# Patient Record
Sex: Male | Born: 1965 | Hispanic: No | Marital: Married | Smoking: Never smoker
Health system: Southern US, Community
[De-identification: ages and names within clinical notes are randomized; demographics above are authoritative.]

---

## 2014-02-11 ENCOUNTER — Emergency Department (INDEPENDENT_AMBULATORY_CARE_PROVIDER_SITE_OTHER)
Admission: EM | Admit: 2014-02-11 | Discharge: 2014-02-11 | Disposition: A | Discharge status: Home / Self Care | Payer: Self-pay | Source: Home / Self Care | Attending: Family Medicine | Admitting: Family Medicine

## 2014-02-11 ENCOUNTER — Emergency Department (INDEPENDENT_AMBULATORY_CARE_PROVIDER_SITE_OTHER): Payer: Self-pay

## 2014-02-11 ENCOUNTER — Encounter (HOSPITAL_COMMUNITY): Payer: Self-pay | Admitting: Emergency Medicine

## 2014-02-11 DIAGNOSIS — R200 Anesthesia of skin: Secondary | ICD-10-CM

## 2014-02-11 DIAGNOSIS — R209 Unspecified disturbances of skin sensation: Secondary | ICD-10-CM

## 2014-02-11 DIAGNOSIS — M79601 Pain in right arm: Secondary | ICD-10-CM

## 2014-02-11 DIAGNOSIS — M79609 Pain in unspecified limb: Secondary | ICD-10-CM

## 2014-02-11 DIAGNOSIS — M79602 Pain in left arm: Principal | ICD-10-CM

## 2014-02-11 NOTE — Discharge Instructions (Signed)
Take ibuprofen every 6 hours as directed on the package.  Follow up with the neurologist as soon as you are able.     Paresthesia Paresthesia is an abnormal burning or prickling sensation. This sensation is generally felt in the hands, arms, legs, or feet. However, it may occur in any part of the body. It is usually not painful. The feeling may be described as:  Tingling or numbness.  "Pins and needles."  Skin crawling.  Buzzing.  Limbs "falling asleep."  Itching. Most people experience temporary (transient) paresthesia at some time in their lives. CAUSES  Paresthesia may occur when you breathe too quickly (hyperventilation). It can also occur without any apparent cause. Commonly, paresthesia occurs when pressure is placed on a nerve. The feeling quickly goes away once the pressure is removed. For some people, however, paresthesia is a long-lasting (chronic) condition caused by an underlying disorder. The underlying disorder may be:  A traumatic, direct injury to nerves. Examples include a:  Broken (fractured) neck.  Fractured skull.  A disorder affecting the brain and spinal cord (central nervous system). Examples include:  Transverse myelitis.  Encephalitis.  Transient ischemic attack.  Multiple sclerosis.  Stroke.  Tumor or blood vessel problems, such as an arteriovenous malformation pressing against the brain or spinal cord.  A condition that damages the peripheral nerves (peripheral neuropathy). Peripheral nerves are not part of the brain and spinal cord. These conditions include:  Diabetes.  Peripheral vascular disease.  Nerve entrapment syndromes, such as carpal tunnel syndrome.  Shingles.  Hypothyroidism.  Vitamin B12 deficiencies.  Alcoholism.  Heavy metal poisoning (lead, arsenic).  Rheumatoid arthritis.  Systemic lupus erythematosus. DIAGNOSIS  Your caregiver will attempt to find the underlying cause of your paresthesia. Your caregiver  may:  Take your medical history.  Perform a physical exam.  Order various lab tests.  Order imaging tests. TREATMENT  Treatment for paresthesia depends on the underlying cause. HOME CARE INSTRUCTIONS  Avoid drinking alcohol.  You may consider massage or acupuncture to help relieve your symptoms.  Keep all follow-up appointments as directed by your caregiver. SEEK IMMEDIATE MEDICAL CARE IF:   You feel weak.  You have trouble walking or moving.  You have problems with speech or vision.  You feel confused.  You cannot control your bladder or bowel movements.  You feel numbness after an injury.  You faint.  Your burning or prickling feeling gets worse when walking.  You have pain, cramps, or dizziness.  You develop a rash. MAKE SURE YOU:  Understand these instructions.  Will watch your condition.  Will get help right away if you are not doing well or get worse. Document Released: 10/11/2002 Document Revised: 01/13/2012 Document Reviewed: 07/12/2011 [facility] Patient Information 2014 [place].

## 2014-02-11 NOTE — ED Notes (Signed)
C/o bilateral hand pain for several years now that is progressively getting worse.  Having tingling sensation in left hand with numbness and weakness.  No relief with otc meds.

## 2014-02-11 NOTE — ED Provider Notes (Signed)
CSN: [id number]     Arrival date & time 02/11/14  1051 History   First MD Initiated Contact with Patient 02/11/14 1328     Chief Complaint  Patient presents with  . Hand Pain   (Consider location/radiation/quality/duration/timing/severity/associated sxs/prior Treatment) HPI Comments: Pt c/o forearm aching pain for years, gradually getting worse. Associated with numbness in fingers/hands that is also progressively worsening. Denies injury to UE or neck. Does repetitive motions at work.   Patient is a 48 y.o. male presenting with hand pain. The history is provided by the patient.  Hand Pain This is a chronic problem. Episode onset: years ago. The problem occurs daily. The problem has been gradually worsening. Nothing aggravates the symptoms. Nothing relieves the symptoms. He has tried nothing for the symptoms.    History reviewed. No pertinent past medical history. History reviewed. No pertinent past surgical history. History reviewed. No pertinent family history. History  Substance Use Topics  . Smoking status: Never Smoker   . Smokeless tobacco: Not on file  . Alcohol Use: Yes    Review of Systems  Constitutional: Negative for fever and chills.  Musculoskeletal: Negative for neck pain.  Neurological: Negative for weakness and numbness.    Allergies  Review of patient's allergies indicates not on file.  Home Medications  No current outpatient prescriptions on file. BP 120/58  Pulse 76  Temp(Src) 98.6 F (37 C) (Oral)  Resp 18  SpO2 100% Physical Exam  Constitutional: He is oriented to person, place, and time. He appears well-developed and well-nourished. No distress.  Musculoskeletal:       Right shoulder: He exhibits tenderness. He exhibits normal range of motion, no bony tenderness and no swelling.       Left shoulder: He exhibits tenderness. He exhibits normal range of motion, no bony tenderness and no swelling.       Right elbow: Normal.      Left elbow: Normal.      Cervical back: He exhibits normal range of motion, no tenderness, no bony tenderness, no swelling and no deformity.       Right hand: He exhibits normal range of motion. Decreased sensation noted. Normal strength noted.       Left hand: He exhibits normal range of motion and no tenderness. Decreased sensation noted. Normal strength noted.  See neuro exam  Neurological: He is alert and oriented to person, place, and time. He has normal strength. A sensory deficit is present.  Decreased sensation C7-C8 distribution in hands B; can't discriminate soft from scratchy/sharp touch in hands in general.     ED Course  Procedures (including critical care time) Labs Review Labs Reviewed - No data to display Imaging Review Dg Cervical Spine Complete  02/11/2014   CLINICAL DATA:  numbness and pain in hands and forearms  EXAM: CERVICAL SPINE  4+ VIEWS  COMPARISON:  None.  FINDINGS: There is no evidence of cervical spine fracture or prevertebral soft tissue swelling. Alignment is normal. No other significant bone abnormalities are identified.  IMPRESSION: Negative cervical spine radiographs.   Electronically Signed   By: Margaree Mackintosh M.D.   On: 02/11/2014 13:10     MDM   1. Bilateral arm pain   2. Arm numbness   pt to take otc ibuprofen as directed on the bottle on a schedule for at least a week. Referred to neurology for f/u.     Carvel Getting, NP 02/11/14 1401

## 2014-02-13 NOTE — ED Provider Notes (Signed)
Medical screening examination/treatment/procedure(s) were performed by resident physician or non-physician practitioner and as supervising physician I was immediately available for consultation/collaboration.   Pauline Good MD.   Billy Fischer, MD 02/13/14 1019

## 2014-03-01 ENCOUNTER — Ambulatory Visit: Payer: Self-pay

## 2014-03-11 ENCOUNTER — Ambulatory Visit: Payer: Self-pay

## 2014-04-13 ENCOUNTER — Ambulatory Visit: Payer: Self-pay | Attending: Internal Medicine

## 2018-12-05 DIAGNOSIS — S6720XA Crushing injury of unspecified hand, initial encounter: Secondary | ICD-10-CM

## 2018-12-05 DIAGNOSIS — M869 Osteomyelitis, unspecified: Secondary | ICD-10-CM

## 2018-12-05 HISTORY — DX: Crushing injury of unspecified hand, initial encounter: S67.20XA

## 2018-12-05 HISTORY — PX: OTHER SURGICAL HISTORY: SHX169

## 2018-12-05 HISTORY — DX: Osteomyelitis, unspecified: M86.9

## 2019-09-23 ENCOUNTER — Inpatient Hospital Stay (HOSPITAL_COMMUNITY)
Admission: EM | Admit: 2019-09-23 | Discharge: 2019-09-27 | DRG: 478 | Disposition: A | Discharge status: Home / Self Care | Payer: Medicaid Other | Attending: Family Medicine | Admitting: Family Medicine

## 2019-09-23 ENCOUNTER — Encounter (HOSPITAL_COMMUNITY): Payer: Self-pay | Admitting: Emergency Medicine

## 2019-09-23 ENCOUNTER — Encounter (HOSPITAL_COMMUNITY)
Admission: EM | Disposition: A | Discharge status: Home / Self Care | Payer: Self-pay | Source: Home / Self Care | Attending: Family Medicine

## 2019-09-23 ENCOUNTER — Inpatient Hospital Stay (HOSPITAL_COMMUNITY): Payer: Medicaid Other | Admitting: Anesthesiology

## 2019-09-23 ENCOUNTER — Other Ambulatory Visit: Payer: Self-pay

## 2019-09-23 ENCOUNTER — Emergency Department (HOSPITAL_COMMUNITY): Payer: Medicaid Other

## 2019-09-23 DIAGNOSIS — Z803 Family history of malignant neoplasm of breast: Secondary | ICD-10-CM

## 2019-09-23 DIAGNOSIS — Z881 Allergy status to other antibiotic agents status: Secondary | ICD-10-CM | POA: Diagnosis not present

## 2019-09-23 DIAGNOSIS — M86142 Other acute osteomyelitis, left hand: Secondary | ICD-10-CM | POA: Diagnosis not present

## 2019-09-23 DIAGNOSIS — B9561 Methicillin susceptible Staphylococcus aureus infection as the cause of diseases classified elsewhere: Secondary | ICD-10-CM | POA: Diagnosis not present

## 2019-09-23 DIAGNOSIS — Z20828 Contact with and (suspected) exposure to other viral communicable diseases: Secondary | ICD-10-CM | POA: Diagnosis present

## 2019-09-23 DIAGNOSIS — D72829 Elevated white blood cell count, unspecified: Secondary | ICD-10-CM | POA: Diagnosis present

## 2019-09-23 DIAGNOSIS — I96 Gangrene, not elsewhere classified: Secondary | ICD-10-CM | POA: Diagnosis present

## 2019-09-23 DIAGNOSIS — X58XXXS Exposure to other specified factors, sequela: Secondary | ICD-10-CM | POA: Diagnosis not present

## 2019-09-23 DIAGNOSIS — S6992XS Unspecified injury of left wrist, hand and finger(s), sequela: Secondary | ICD-10-CM | POA: Diagnosis not present

## 2019-09-23 DIAGNOSIS — F419 Anxiety disorder, unspecified: Secondary | ICD-10-CM | POA: Diagnosis not present

## 2019-09-23 DIAGNOSIS — G629 Polyneuropathy, unspecified: Secondary | ICD-10-CM | POA: Diagnosis present

## 2019-09-23 DIAGNOSIS — Z03818 Encounter for observation for suspected exposure to other biological agents ruled out: Secondary | ICD-10-CM | POA: Diagnosis not present

## 2019-09-23 DIAGNOSIS — M65842 Other synovitis and tenosynovitis, left hand: Secondary | ICD-10-CM | POA: Diagnosis present

## 2019-09-23 DIAGNOSIS — Z89022 Acquired absence of left finger(s): Secondary | ICD-10-CM

## 2019-09-23 DIAGNOSIS — M869 Osteomyelitis, unspecified: Secondary | ICD-10-CM | POA: Diagnosis not present

## 2019-09-23 DIAGNOSIS — M7989 Other specified soft tissue disorders: Secondary | ICD-10-CM | POA: Diagnosis not present

## 2019-09-23 DIAGNOSIS — M79645 Pain in left finger(s): Secondary | ICD-10-CM | POA: Diagnosis not present

## 2019-09-23 DIAGNOSIS — B951 Streptococcus, group B, as the cause of diseases classified elsewhere: Secondary | ICD-10-CM | POA: Diagnosis not present

## 2019-09-23 DIAGNOSIS — M659 Synovitis and tenosynovitis, unspecified: Secondary | ICD-10-CM | POA: Diagnosis not present

## 2019-09-23 HISTORY — PX: I & D EXTREMITY: SHX5045

## 2019-09-23 LAB — SARS CORONAVIRUS 2 BY RT PCR (HOSPITAL ORDER, PERFORMED IN ~~LOC~~ HOSPITAL LAB): SARS Coronavirus 2: NEGATIVE

## 2019-09-23 LAB — CBC WITH DIFFERENTIAL/PLATELET
Abs Immature Granulocytes: 0.12 10*3/uL — ABNORMAL HIGH (ref 0.00–0.07)
Basophils Absolute: 0.1 10*3/uL (ref 0.0–0.1)
Basophils Relative: 1 %
Eosinophils Absolute: 0.1 10*3/uL (ref 0.0–0.5)
Eosinophils Relative: 1 %
HCT: 41.9 % (ref 39.0–52.0)
Hemoglobin: 13.2 g/dL (ref 13.0–17.0)
Immature Granulocytes: 1 %
Lymphocytes Relative: 8 %
Lymphs Abs: 1.4 10*3/uL (ref 0.7–4.0)
MCH: 27.4 pg (ref 26.0–34.0)
MCHC: 31.5 g/dL (ref 30.0–36.0)
MCV: 87.1 fL (ref 80.0–100.0)
Monocytes Absolute: 1.8 10*3/uL — ABNORMAL HIGH (ref 0.1–1.0)
Monocytes Relative: 10 %
Neutro Abs: 14.5 10*3/uL — ABNORMAL HIGH (ref 1.7–7.7)
Neutrophils Relative %: 79 %
Platelets: 214 10*3/uL (ref 150–400)
RBC: 4.81 MIL/uL (ref 4.22–5.81)
RDW: 13 % (ref 11.5–15.5)
WBC: 18.1 10*3/uL — ABNORMAL HIGH (ref 4.0–10.5)
nRBC: 0 % (ref 0.0–0.2)

## 2019-09-23 LAB — BASIC METABOLIC PANEL
Anion gap: 7 (ref 5–15)
BUN: 14 mg/dL (ref 6–20)
CO2: 27 mmol/L (ref 22–32)
Calcium: 8.7 mg/dL — ABNORMAL LOW (ref 8.9–10.3)
Chloride: 104 mmol/L (ref 98–111)
Creatinine, Ser: 0.85 mg/dL (ref 0.61–1.24)
GFR calc Af Amer: >60 mL/min (ref >60)
GFR calc non Af Amer: >60 mL/min (ref >60)
Glucose, Bld: 88 mg/dL (ref 70–99)
Potassium: 4.8 mmol/L (ref 3.5–5.1)
Sodium: 138 mmol/L (ref 135–145)

## 2019-09-23 SURGERY — IRRIGATION AND DEBRIDEMENT EXTREMITY
Anesthesia: General | Site: Finger | Laterality: Left

## 2019-09-23 MED ORDER — LIDOCAINE 2% (20 MG/ML) 5 ML SYRINGE
INTRAMUSCULAR | Status: AC
  Filled 2019-09-23: qty 5

## 2019-09-23 MED ORDER — PHENYLEPHRINE 40 MCG/ML (10ML) SYRINGE FOR IV PUSH (FOR BLOOD PRESSURE SUPPORT)
PREFILLED_SYRINGE | INTRAVENOUS | Status: AC
  Filled 2019-09-23: qty 20

## 2019-09-23 MED ORDER — LIDOCAINE 2% (20 MG/ML) 5 ML SYRINGE
INTRAMUSCULAR | Status: DC | PRN
  Administered 2019-09-23: 60 mg via INTRAVENOUS

## 2019-09-23 MED ORDER — ONDANSETRON HCL 4 MG/2ML IJ SOLN
INTRAMUSCULAR | Status: DC | PRN
  Administered 2019-09-23: 4 mg via INTRAVENOUS

## 2019-09-23 MED ORDER — ACETAMINOPHEN 325 MG PO TABS
650.0000 mg | ORAL_TABLET | Freq: Four times a day (QID) | ORAL | Status: DC | PRN
  Administered 2019-09-23: 650 mg via ORAL
  Filled 2019-09-23: qty 2

## 2019-09-23 MED ORDER — CHLORHEXIDINE GLUCONATE 4 % EX LIQD: 60.0000 mL | Freq: Once | CUTANEOUS | Status: DC

## 2019-09-23 MED ORDER — VANCOMYCIN HCL 10 G IV SOLR
1750.0000 mg | Freq: Once | INTRAVENOUS | Status: DC
  Filled 2019-09-23: qty 1750

## 2019-09-23 MED ORDER — PROPOFOL 10 MG/ML IV BOLUS
INTRAVENOUS | Status: DC | PRN
  Administered 2019-09-23: 200 mg via INTRAVENOUS

## 2019-09-23 MED ORDER — FENTANYL CITRATE (PF) 250 MCG/5ML IJ SOLN
INTRAMUSCULAR | Status: AC
  Filled 2019-09-23: qty 5

## 2019-09-23 MED ORDER — DEXAMETHASONE SODIUM PHOSPHATE 10 MG/ML IJ SOLN
INTRAMUSCULAR | Status: DC | PRN
  Administered 2019-09-23: 10 mg via INTRAVENOUS

## 2019-09-23 MED ORDER — ONDANSETRON HCL 4 MG PO TABS: 4.0000 mg | ORAL_TABLET | Freq: Four times a day (QID) | ORAL | Status: DC | PRN

## 2019-09-23 MED ORDER — MIDAZOLAM HCL 2 MG/2ML IJ SOLN
INTRAMUSCULAR | Status: AC
  Filled 2019-09-23: qty 2

## 2019-09-23 MED ORDER — MIDAZOLAM HCL 5 MG/5ML IJ SOLN
INTRAMUSCULAR | Status: DC | PRN
  Administered 2019-09-23: 2 mg via INTRAVENOUS

## 2019-09-23 MED ORDER — KETOROLAC TROMETHAMINE 15 MG/ML IJ SOLN: 15.0000 mg | Freq: Four times a day (QID) | INTRAMUSCULAR | Status: DC | PRN

## 2019-09-23 MED ORDER — EPHEDRINE 5 MG/ML INJ
INTRAVENOUS | Status: AC
  Filled 2019-09-23: qty 20

## 2019-09-23 MED ORDER — PROPOFOL 10 MG/ML IV BOLUS
INTRAVENOUS | Status: AC
  Filled 2019-09-23: qty 20

## 2019-09-23 MED ORDER — ONDANSETRON HCL 4 MG/2ML IJ SOLN
INTRAMUSCULAR | Status: AC
  Filled 2019-09-23: qty 2

## 2019-09-23 MED ORDER — ENOXAPARIN SODIUM 40 MG/0.4ML ~~LOC~~ SOLN
40.0000 mg | SUBCUTANEOUS | Status: DC
  Administered 2019-09-24 – 2019-09-27 (×4): 40 mg via SUBCUTANEOUS
  Filled 2019-09-23 (×4): qty 0.4

## 2019-09-23 MED ORDER — SODIUM CHLORIDE 0.9 % IR SOLN
Status: DC | PRN
  Administered 2019-09-23: 3000 mL

## 2019-09-23 MED ORDER — BUPIVACAINE HCL (PF) 0.25 % IJ SOLN
INTRAMUSCULAR | Status: AC
  Filled 2019-09-23: qty 30

## 2019-09-23 MED ORDER — LACTATED RINGERS IV SOLN
INTRAVENOUS | Status: DC
  Administered 2019-09-23 (×2): via INTRAVENOUS

## 2019-09-23 MED ORDER — CLINDAMYCIN PHOSPHATE 900 MG/50ML IV SOLN
INTRAVENOUS | Status: AC
  Filled 2019-09-23: qty 50

## 2019-09-23 MED ORDER — ROCURONIUM BROMIDE 10 MG/ML (PF) SYRINGE
PREFILLED_SYRINGE | INTRAVENOUS | Status: AC
  Filled 2019-09-23: qty 10

## 2019-09-23 MED ORDER — CLINDAMYCIN PHOSPHATE 900 MG/50ML IV SOLN
900.0000 mg | INTRAVENOUS | Status: AC
  Administered 2019-09-23: 900 mg via INTRAVENOUS

## 2019-09-23 MED ORDER — POVIDONE-IODINE 10 % EX SWAB: 2.0000 "application " | Freq: Once | CUTANEOUS | Status: DC

## 2019-09-23 MED ORDER — BUPIVACAINE HCL (PF) 0.25 % IJ SOLN
INTRAMUSCULAR | Status: DC | PRN
  Administered 2019-09-23: 10 mL

## 2019-09-23 MED ORDER — DEXAMETHASONE SODIUM PHOSPHATE 10 MG/ML IJ SOLN
INTRAMUSCULAR | Status: AC
  Filled 2019-09-23: qty 1

## 2019-09-23 MED ORDER — VANCOMYCIN HCL 10 G IV SOLR
1500.0000 mg | Freq: Two times a day (BID) | INTRAVENOUS | Status: DC
  Filled 2019-09-23: qty 1500

## 2019-09-23 MED ORDER — POTASSIUM CHLORIDE IN NACL 20-0.9 MEQ/L-% IV SOLN
INTRAVENOUS | Status: AC
  Filled 2019-09-23: qty 1000

## 2019-09-23 MED ORDER — 0.9 % SODIUM CHLORIDE (POUR BTL) OPTIME
TOPICAL | Status: DC | PRN
  Administered 2019-09-23: 1000 mL

## 2019-09-23 MED ORDER — ONDANSETRON HCL 4 MG/2ML IJ SOLN: 4.0000 mg | Freq: Four times a day (QID) | INTRAMUSCULAR | Status: DC | PRN

## 2019-09-23 MED ORDER — FENTANYL CITRATE (PF) 100 MCG/2ML IJ SOLN
INTRAMUSCULAR | Status: DC | PRN
  Administered 2019-09-23: 50 ug via INTRAVENOUS
  Administered 2019-09-23 (×3): 25 ug via INTRAVENOUS

## 2019-09-23 MED ORDER — ACETAMINOPHEN 650 MG RE SUPP: 650.0000 mg | Freq: Four times a day (QID) | RECTAL | Status: DC | PRN

## 2019-09-23 SURGICAL SUPPLY — 41 items
BNDG CONFORM 2 STRL LF (GAUZE/BANDAGES/DRESSINGS) IMPLANT
BNDG ELASTIC 4X5.8 VLCR STR LF (GAUZE/BANDAGES/DRESSINGS) ×2 IMPLANT
BNDG GAUZE ELAST 4 BULKY (GAUZE/BANDAGES/DRESSINGS) ×6 IMPLANT
CORD BIPOLAR FORCEPS 12FT (ELECTRODE) ×2 IMPLANT
COVER SURGICAL LIGHT HANDLE (MISCELLANEOUS) ×2 IMPLANT
COVER WAND RF STERILE (DRAPES) ×2 IMPLANT
CUFF TOURN SGL QUICK 18X4 (TOURNIQUET CUFF) ×2 IMPLANT
CUFF TOURN SGL QUICK 24 (TOURNIQUET CUFF)
CUFF TRNQT CYL 24X4X16.5-23 (TOURNIQUET CUFF) IMPLANT
DRSG ADAPTIC 3X8 NADH LF (GAUZE/BANDAGES/DRESSINGS) ×2 IMPLANT
GAUZE SPONGE 4X4 12PLY STRL (GAUZE/BANDAGES/DRESSINGS) ×2 IMPLANT
GAUZE XEROFORM 1X8 LF (GAUZE/BANDAGES/DRESSINGS) ×2 IMPLANT
GLOVE BIO SURGEON STRL SZ7.5 (GLOVE) ×2 IMPLANT
GLOVE BIOGEL PI IND STRL 8 (GLOVE) ×1 IMPLANT
GLOVE BIOGEL PI INDICATOR 8 (GLOVE) ×1
GOWN STRL REUS W/ TWL LRG LVL3 (GOWN DISPOSABLE) ×1 IMPLANT
GOWN STRL REUS W/ TWL XL LVL3 (GOWN DISPOSABLE) ×2 IMPLANT
GOWN STRL REUS W/TWL LRG LVL3 (GOWN DISPOSABLE) ×1
GOWN STRL REUS W/TWL XL LVL3 (GOWN DISPOSABLE) ×2
KIT BASIN OR (CUSTOM PROCEDURE TRAY) ×2 IMPLANT
KIT TURNOVER KIT B (KITS) ×2 IMPLANT
MANIFOLD NEPTUNE II (INSTRUMENTS) ×2 IMPLANT
NEEDLE HYPO 25GX1X1/2 BEV (NEEDLE) IMPLANT
NS IRRIG 1000ML POUR BTL (IV SOLUTION) ×2 IMPLANT
PACK ORTHO EXTREMITY (CUSTOM PROCEDURE TRAY) ×2 IMPLANT
PAD ARMBOARD 7.5X6 YLW CONV (MISCELLANEOUS) ×2 IMPLANT
PAD CAST 4YDX4 CTTN HI CHSV (CAST SUPPLIES) ×1 IMPLANT
PADDING CAST COTTON 4X4 STRL (CAST SUPPLIES) ×1
SET CYSTO W/LG BORE CLAMP LF (SET/KITS/TRAYS/PACK) ×2 IMPLANT
SOL PREP POV-IOD 4OZ 10% (MISCELLANEOUS) ×4 IMPLANT
SPONGE LAP 4X18 RFD (DISPOSABLE) ×2 IMPLANT
SUT PROLENE 4 0 PS 2 18 (SUTURE) ×2 IMPLANT
SWAB COLLECTION DEVICE MRSA (MISCELLANEOUS) ×2 IMPLANT
SWAB CULTURE ESWAB REG 1ML (MISCELLANEOUS) ×2 IMPLANT
SYR CONTROL 10ML LL (SYRINGE) IMPLANT
TOWEL GREEN STERILE (TOWEL DISPOSABLE) ×2 IMPLANT
TOWEL GREEN STERILE FF (TOWEL DISPOSABLE) ×2 IMPLANT
TUBE CONNECTING 12X1/4 (SUCTIONS) ×2 IMPLANT
UNDERPAD 30X30 (UNDERPADS AND DIAPERS) ×2 IMPLANT
WATER STERILE IRR 1000ML POUR (IV SOLUTION) ×2 IMPLANT
YANKAUER SUCT BULB TIP NO VENT (SUCTIONS) ×2 IMPLANT

## 2019-09-23 NOTE — Anesthesia Procedure Notes (Signed)
Procedure Name: LMA Insertion Date/Time: 09/23/2019 6:26 PM Performed by: Sabriah Hobbins T, CRNA Pre-anesthesia Checklist: Patient identified, Emergency Drugs available, Suction available and Patient being monitored Patient Re-evaluated:Patient Re-evaluated prior to induction Oxygen Delivery Method: Circle system utilized Preoxygenation: Pre-oxygenation with 100% oxygen Induction Type: IV induction LMA: LMA inserted LMA Size: 5.0 Number of attempts: 1 Airway Equipment and Method: Patient positioned with wedge pillow Placement Confirmation: positive ETCO2 and breath sounds checked- equal and bilateral Tube secured with: Tape Dental Injury: Teeth and Oropharynx as per pre-operative assessment

## 2019-09-23 NOTE — ED Notes (Addendum)
Pt belongings given to pt's mother. Cell phone give to security.

## 2019-09-23 NOTE — ED Triage Notes (Signed)
Left middle finger injury last  April an has been having issues since , middle  Finger swollen and tender red, draining ,  Got worse last night, may have reinjuried it

## 2019-09-23 NOTE — ED Provider Notes (Addendum)
[facility] Emergency Department Provider Note MRN:  [id number]  [place] date & time: 09/23/19     Chief Complaint   Finger Injury   History of Present Illness   Carlos [name] is a 53 y.o. year-old male with no pertinent past medical history presenting to the ED with chief complaint of finger injury.  Patient explains that he injured multiple fingers while at work back in February.  He was evaluated by physicians in [place].  He said his recovery was complicated by bone infection of the left middle finger.  Yesterday he began having worsening pain and swelling to the left finger.  Feels similar to the last time he had bone infection.  Endorsing decreased sensation to the left finger.  Endorsing purulent discharge when he squeezes the finger.  Denies fever, no chest pain or shortness of breath, no abdominal pain, no other complaints.  Review of Systems  A complete 10 system review of systems was obtained and all systems are negative except as noted in the HPI and PMH.   Patient's Health History   Past medical history: Osteomyelitis Past surgical history: Partial amputation of finger No family history on file.  Social History   Socioeconomic History  . Marital status: Married    Spouse name: Not on file  . Number of children: Not on file  . Years of education: Not on file  . Highest education level: Not on file  Occupational History  . Not on file  Social Needs  . Financial resource strain: Not on file  . Food insecurity    Worry: Not on file    Inability: Not on file  . Transportation needs    Medical: Not on file    Non-medical: Not on file  Tobacco Use  . Smoking status: Never Smoker  . Smokeless tobacco: Never Used  Substance and Sexual Activity  . Alcohol use: Yes  . Drug use: No  . Sexual activity: Yes  Lifestyle  . Physical activity    Days per week: Not on file    Minutes per session: Not on file  . Stress: Not on file   Relationships  . Social Herbalist on phone: Not on file    Gets together: Not on file    Attends religious service: Not on file    Active member of club or organization: Not on file    Attends meetings of clubs or organizations: Not on file    Relationship status: Not on file  . Intimate partner violence    Fear of current or ex partner: Not on file    Emotionally abused: Not on file    Physically abused: Not on file    Forced sexual activity: Not on file  Other Topics Concern  . Not on file  Social History Narrative  . Not on file     Physical Exam  Vital Signs and Nursing Notes reviewed Vitals:   09/23/19 1501 09/23/19 1502  BP: (!) 128/101   Pulse: (!) 109 89  Resp: 14   Temp:    SpO2: 98% 98%    CONSTITUTIONAL: Well-appearing, NAD NEURO:  Alert and oriented x 3, no focal deficits EYES:  eyes equal and reactive ENT/NECK:  no LAD, no JVD CARDIO: Regular rate, well-perfused, normal S1 and S2 PULM:  CTAB no wheezing or rhonchi GI/GU:  normal bowel sounds, non-distended, non-tender MSK/SPINE: Prominent swelling to the left middle finger, erythema, tip of the finger with poor  cap refill, purulent discharge with palpation SKIN:  no rash, atraumatic PSYCH:  Appropriate speech and behavior  Diagnostic and Interventional Summary    EKG Interpretation  Date/Time:    Ventricular Rate:    PR Interval:    QRS Duration:   QT Interval:    QTC Calculation:   R Axis:     Text Interpretation:        Labs Reviewed  CBC WITH DIFFERENTIAL/PLATELET - Abnormal; Notable for the following components:      Result Value   WBC 18.1 (*)    Neutro Abs 14.5 (*)    Monocytes Absolute 1.8 (*)    Abs Immature Granulocytes 0.12 (*)    All other components within normal limits  BASIC METABOLIC PANEL - Abnormal; Notable for the following components:   Calcium 8.7 (*)    All other components within normal limits  SARS CORONAVIRUS 2 (TAT 6-24 HRS)    DG Finger Middle Left   Final Result      Medications  vancomycin (VANCOCIN) 1,750 mg in sodium chloride 0.9 % 500 mL IVPB (has no administration in time range)  vancomycin (VANCOCIN) 1,500 mg in sodium chloride 0.9 % 500 mL IVPB (has no administration in time range)     Procedures  /  Critical Care .Critical Care Performed by: Maudie Flakes, MD Authorized by: Maudie Flakes, MD   Critical care provider statement:    Critical care time (minutes):  33   Critical care was necessary to treat or prevent imminent or life-threatening deterioration of the following conditions: Osteomyelitis, need for urgent intervention.   Critical care was time spent personally by me on the following activities:  Discussions with consultants, evaluation of patient's response to treatment, examination of patient, ordering and performing treatments and interventions, ordering and review of laboratory studies, ordering and review of radiographic studies, pulse oximetry, re-evaluation of patient's condition, obtaining history from patient or surrogate and review of old charts    ED Course and Medical Decision Making  I have reviewed the triage vital signs and the nursing notes.  Pertinent labs & imaging results that were available during my care of the patient were reviewed by me and considered in my medical decision making (see below for details).     Clinical concern for osteomyelitis, seems to be confirmed with x-ray imaging today, fairly significant leukocytosis, but patient with normal vital signs, not febrile, well-appearing.  Anticipating need for admission for IV antibiotics and possible amputation.  Will consult hand surgery.  Hand surgery to evaluate.  Anticipate need for admission for IV antibiotics and debridement of some sort.  Hand surgery may defer admission to hospitalist service.  Signed out to oncoming provider at shift change.  3:37 PM update: Discussed case with Hilbert Odor of hand surgery, who recommends  hospitalist admission, will be taken to the OR later today, requesting rapid Covid test, which I called for and requested.  To be admitted to hospital service.  Barth Kirks. Sedonia Small, MD [place] [email or web address]  Final Clinical Impressions(s) / ED Diagnoses     ICD-10-CM   1. Osteomyelitis, unspecified site, unspecified type Operating Room Services)  M86.9     ED Discharge Orders    None       Discharge Instructions Discussed with and Provided to Patient:   Discharge Instructions   None       Maudie Flakes, MD 09/23/19 1525    Maudie Flakes, MD 09/23/19 1538

## 2019-09-23 NOTE — Consult Note (Signed)
Reason for Consult:Left long finger infection Referring Physician: Moustapha [name] is an 53 y.o. male.  HPI: Carlos [name] comes in with a 1 week hx/o drainage from his left long finger. He injured it about a year ago and had a very difficult time getting it to heal. He was ultimately treated by a hand surgeon in [place] and they apparently felt he was healed when he came up to [facility]. About a week ago he noticed some foul drainage coming from the wound and this morning he woke up with redness and swelling. He denies significant fevers. Interestingly he reports lack of sensation in that finger that predated the accident and general lack of sensation in both hands. X-rays here showed osteo of the distal phalanx and hand surgery was consulted. He is LHD.  History reviewed. No pertinent past medical history.  No past surgical history on file.  No family history on file.  Social History:  reports that he has never smoked. He has never used smokeless tobacco. He reports current alcohol use. He reports that he does not use drugs.  Allergies:  Allergies  Allergen Reactions  . Penicillins     hives    Medications: I have reviewed the patient's current medications.  Results for orders placed or performed during the hospital encounter of 09/23/19 (from the past 48 hour(s))  CBC with Differential     Status: Abnormal   Collection Time: 09/23/19  1:39 PM  Result Value Ref Range   WBC 18.1 (H) 4.0 - 10.5 K/uL   RBC 4.81 4.22 - 5.81 MIL/uL   Hemoglobin 13.2 13.0 - 17.0 g/dL   HCT 41.9 39.0 - 52.0 %   MCV 87.1 80.0 - 100.0 fL   MCH 27.4 26.0 - 34.0 pg   MCHC 31.5 30.0 - 36.0 g/dL   RDW 13.0 11.5 - 15.5 %   Platelets 214 150 - 400 K/uL   nRBC 0.0 0.0 - 0.2 %   Neutrophils Relative % 79 %   Neutro Abs 14.5 (H) 1.7 - 7.7 K/uL   Lymphocytes Relative 8 %   Lymphs Abs 1.4 0.7 - 4.0 K/uL   Monocytes Relative 10 %   Monocytes Absolute 1.8 (H) 0.1 - 1.0 K/uL   Eosinophils Relative 1 %   Eosinophils Absolute  0.1 0.0 - 0.5 K/uL   Basophils Relative 1 %   Basophils Absolute 0.1 0.0 - 0.1 K/uL   Immature Granulocytes 1 %   Abs Immature Granulocytes 0.12 (H) 0.00 - 0.07 K/uL    Comment: Performed at [facility], [address], [place], [place] [id number]  Basic metabolic panel     Status: Abnormal   Collection Time: 09/23/19  1:39 PM  Result Value Ref Range   Sodium 138 135 - 145 mmol/L   Potassium 4.8 3.5 - 5.1 mmol/L   Chloride 104 98 - 111 mmol/L   CO2 27 22 - 32 mmol/L   Glucose, Bld 88 70 - 99 mg/dL   BUN 14 6 - 20 mg/dL   Creatinine, Ser 0.85 0.61 - 1.24 mg/dL   Calcium 8.7 (L) 8.9 - 10.3 mg/dL   GFR calc non Af Amer >60 >60 mL/min   GFR calc Af Amer >60 >60 mL/min   Anion gap 7 5 - 15    Comment: Performed at [facility] [address] [place], [place] [address]    Dg Finger Middle Left  Result Date: 09/23/2019 CLINICAL DATA:  Pain and swelling  distally EXAM: LEFT THIRD FINGER 2+V COMPARISON:  None. FINDINGS: Frontal, oblique, and lateral views were obtained. There is been previous amputation of most of the third distal phalanx. Within the area of remaining distal third phalanx, there is an area of apparent loss of cortex which is concerning for potential underlying osteomyelitis. This area of suspected bony destruction does not extend to the joint space. There are several small bony fragments in this area which are probably of posttraumatic etiology. There is a focus of calcification volar to the midportion of the third middle phalanx which may represent residua of prior trauma. Bones elsewhere appear intact. No acute fracture or dislocation. No joint space narrowing or erosive change. No soft tissue air. 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 There is an area of loss of cortex in this area of amputation which must be of concern for focal osteomyelitis. This small focus of apparent bony destruction does not reach the DIP joint space.  There are fragments of bone in this area which are likely of posttraumatic etiology. A small focus of calcification volar to the third middle phalanx is likely of posttraumatic etiology. No acute fracture or dislocation. No joint space narrowing or joint erosion. From an imaging standpoint, MR potentially could be helpful to further evaluate this area in the third distal phalanx remnant concerning for small focus of destruction. Electronically Signed   By: Lowella Grip III M.D.   On: 09/23/2019 13:35    Review of Systems  Constitutional: Negative for chills, fever and weight loss.  HENT: Negative for ear discharge, ear pain, hearing loss and tinnitus.   Eyes: Negative for blurred vision, double vision, photophobia and pain.  Respiratory: Negative for cough, sputum production and shortness of breath.   Cardiovascular: Negative for chest pain.  Gastrointestinal: Negative for abdominal pain, nausea and vomiting.  Genitourinary: Negative for dysuria, flank pain, frequency and urgency.  Musculoskeletal: Negative for back pain, falls, joint pain, myalgias and neck pain.  Neurological: Negative for dizziness, tingling, sensory change, focal weakness, loss of consciousness and headaches.  Endo/Heme/Allergies: Does not bruise/bleed easily.  Psychiatric/Behavioral: Negative for depression, memory loss and substance abuse. The patient is not nervous/anxious.    Blood pressure (!) 128/101, pulse 89, temperature 98.5 F (36.9 C), temperature source Oral, resp. rate 14, height 6\' 4"  (1.93 m), weight 84.4 kg, SpO2 98 %. Physical Exam  Constitutional: He appears well-developed and well-nourished. No distress.  HENT:  Head: Normocephalic and atraumatic.  Eyes: Conjunctivae are normal. Right eye exhibits no discharge. Left eye exhibits no discharge. No scleral icterus.  Neck: Normal range of motion.  Cardiovascular: Normal rate and regular rhythm.  Respiratory: Effort normal. No respiratory distress.   Musculoskeletal:     Comments: Left shoulder, elbow, wrist, digits- Fusiform edema, erythema long finger, purulent discharge from sinus on tip, nontender, unable to extend at PIP, no instability, no blocks to motion  Sens  Ax/R/M/U paresthetic  Mot   Ax/ R/ PIN/ M/ AIN/ U intact  Rad 2+  Neurological: He is alert.  Skin: Skin is warm and dry. He is not diaphoretic.  Psychiatric: He has a normal mood and affect. His behavior is normal.      Assessment/Plan: Left long finger infection -- Will take to OR for I&D, possible amputation. Ask medicine to admit for IV abx, will need workup for hand numbness as well as his history is not likely c/w carpal tunnel.    Lisette Abu, PA-C  Orthopedic Surgery [phone] 09/23/2019, 3:11 PM

## 2019-09-23 NOTE — Progress Notes (Addendum)
Report received from [place] in PACU. Patient VS stable, no c/o of current pain. Will continue to monitor. Patient phone retrieved from security and given to patient.

## 2019-09-23 NOTE — ED Notes (Signed)
Provider at bedside

## 2019-09-23 NOTE — H&P (Signed)
History and Physical    Carlos [name] [id number] DOB: 07/21/1966 DOA: 09/23/2019  PCP: Patient, No Pcp Per  Patient coming from: Home  I have personally briefly reviewed patient's old medical records in [place]  Chief Complaint: Left long finger pain and swelling since this morning  HPI: Carlos [name] is a 53 y.o. male with medical history significant of crush injury to the bilateral hands in February 2020 it resulted in several broken fingers and decrease of sensation in his hands.  Unfortunately his left long finger developed an osteomyelitis and he was treated conservatively but that did not resolve.  He required a partial amputation of his finger and seemed to have resolved.  He was in his usual state of health until 1 week ago when he noted drainage from his left long finger.  This morning he awoke with redness and swelling of the finger without any lymphangitis.  He has had no fevers.  His lack of sensation probably contributed to the late presentation.  X-rays showed osteomyelitis of the distal phalanx of the left long finger.  Hand surgery has been consulted and has seen the patient.  They asked that we admit the patient for further evaluation and management.  ED Course: Vital signs stable, leukocytosis noted.  X-rays consistent with osteomyelitis of left long finger.  Review of Systems: As per HPI otherwise all other systems reviewed and  negative.  Past Medical History:  Diagnosis Date  . Crush injury to hand 12/2018   bilateral hands with multipe fractures  . Finger osteomyelitis, left ([place]) 12/2018    Past Surgical History:  Procedure Laterality Date  . partial amputation left 3rd finger Left 12/2018    Social History   Social History Narrative   Lives with mother     reports that he has never smoked. He has never used smokeless tobacco. He reports current alcohol use of about 2.0 standard drinks of alcohol per week. He reports that he does not use drugs.   Allergies  Allergen Reactions  . Penicillins Hives    Did it involve swelling of the face/tongue/throat, SOB, or low No Did it involve sudden or severe rash/hives, skin peeling, or any reaction on the inside of your mouth or nose?Yes Did you need to seek medical attention at a hospital or doctor's office? Yes When did it last happen?April 2020 If all above answers are "NO", may proceed with cephalosporin use.    Family History  Problem Relation Age of Onset  . Breast cancer Mother        DCIS times 2     Prior to Admission medications   Not on File    Physical Exam:  Constitutional: NAD, calm, comfortable Vitals:   09/23/19 1400 09/23/19 1501 09/23/19 1502 09/23/19 1515  BP:  (!) 128/101  (!) 129/96  Pulse:  (!) 109 89   Resp:  14    Temp:      TempSrc:      SpO2:  98% 98%   Weight: 84.4 kg  84.4 kg   Height:   6\' 4"  (1.93 m)    Eyes: PERRL, lids and conjunctivae normal ENMT: Mucous membranes are moist. Posterior pharynx clear of any exudate or lesions.Normal dentition.  Neck: normal, supple, no masses, no thyromegaly Respiratory: clear to auscultation bilaterally, no wheezing, no crackles. Normal respiratory effort. No accessory muscle use.  Cardiovascular: Regular rate and rhythm, no murmurs / rubs / gallops. No extremity edema. 2+ pedal pulses. No carotid bruits.  Abdomen: no tenderness, no masses palpated. No hepatosplenomegaly. Bowel sounds positive.  Musculoskeletal: no clubbing / cyanosis. No joint deformity upper right and bilateral lower extremities. Good ROM, no contractures. Normal muscle tone.  Left long finger markedly swollen with erythema but no lymphangitis amputation site with old eschar Skin: no rashes, lesions, ulcers. No induration Neurologic: CN 2-12 grossly intact. Sensation intact, DTR normal. Strength 5/5 in all 4.  Psychiatric: Normal judgment and insight. Alert and oriented x 3. Normal mood.    Labs on Admission: I have personally reviewed  following labs and imaging studies  CBC: Recent Labs  Lab 09/23/19 1339  WBC 18.1*  NEUTROABS 14.5*  HGB 13.2  HCT 41.9  MCV 87.1  PLT [id number]   Basic Metabolic Panel: Recent Labs  Lab 09/23/19 1339  NA 138  K 4.8  CL 104  CO2 27  GLUCOSE 88  BUN 14  CREATININE 0.85  CALCIUM 8.7*   GFR: Estimated Creatinine Clearance: 122.7 mL/min (by C-G formula based on SCr of 0.85 mg/dL).  Radiological Exams on Admission: Dg Finger Middle Left  Result Date: 09/23/2019 CLINICAL DATA:  Pain and swelling distally EXAM: LEFT THIRD FINGER 2+V COMPARISON:  None. FINDINGS: Frontal, oblique, and lateral views were obtained. There is been previous amputation of most of the third distal phalanx. Within the area of remaining distal third phalanx, there is an area of apparent loss of cortex which is concerning for potential underlying osteomyelitis. This area of suspected bony destruction does not extend to the joint space. There are several small bony fragments in this area which are probably of posttraumatic etiology. There is a focus of calcification volar to the midportion of the third middle phalanx which may represent residua of prior trauma. Bones elsewhere appear intact. No acute fracture or dislocation. No joint space narrowing or erosive change. No soft tissue air. 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 There is an area of loss of cortex in this area of amputation which must be of concern for focal osteomyelitis. This small focus of apparent bony destruction does not reach the DIP joint space. There are fragments of bone in this area which are likely of posttraumatic etiology. A small focus of calcification volar to the third middle phalanx is likely of posttraumatic etiology. No acute fracture or dislocation. No joint space narrowing or joint erosion. From an imaging standpoint, MR potentially could be helpful to further evaluate this area in the third distal  phalanx remnant concerning for small focus of destruction. Electronically Signed   By: Lowella Grip III M.D.   On: 09/23/2019 13:35     Assessment/Plan Principal Problem:   Finger osteomyelitis, left (HCC) Active Problems:   Leukocytosis    Osteomyelitis of left long finger: Patient is left-handed.  He was seen by hand surgery who plans to take him to the operating room tonight.  Initially vancomycin was ordered by ED but this has been held by orthopedic surgery who plans to order antibiotics after culture obtained.  Does not appear to be septic.  He has no fever, no chills, no shortness of breath, he had a transiently elevated pulse in the emergency department but it has not been sustained.  Oxygen saturations are stable.    Leukocytosis: Laded to acute infection we will continue to monitor recheck labs in a.m.  DVT prophylaxis: Lovenox Code Status: Full code discussed with patient Family Communication: Spoke with patient's mother who was present in the room at the  time of admission Disposition Plan: Likely discharge home in 3 to 4 days Consults called: Hand surgery, Dr. Jeannie Fend Admission status: Admit - It is my clinical opinion that admission to INPATIENT is reasonable and necessary because of the expectation that this patient will require hospital care that crosses at least 2 midnights to treat this condition based on the medical complexity of the problems presented.  Given the aforementioned information, the predictability of an adverse outcome is felt to be significant.  Patient will be requiring IV antibiotics in addition to a surgical procedure and management postoperatively    Lady Deutscher MD FACP [facility] Pager [phone]  How to contact the [facility] Attending or Consulting provider [place] or covering provider during after hours [place], for this patient?  1. Check the care team in [facility] and look for a) attending/consulting [facility] provider listed and b) the [facility]  team listed 2. Log into [email or web address] and use [place]'s universal password to access. If you do not have the password, please contact the hospital operator. 3. Locate the [facility] provider you are looking for under [facility] and page to a number that you can be directly reached. 4. If you still have difficulty reaching the provider, please page the [facility] (Director on Call) for the Hospitalists listed on amion for assistance.  If 7PM-7AM, please contact night-coverage [email or web address] Password [facility]  09/23/2019, 4:34 PM

## 2019-09-23 NOTE — Op Note (Signed)
PREOPERATIVE DIAGNOSIS: Left middle finger distal phalanx osteomyelitis  POSTOPERATIVE DIAGNOSIS: Left middle finger distal phalanx osteomyelitis and flexor tenosynovitis  ATTENDING PHYSICIAN: Maudry Mayhew. Jeannie Fend, III, MD who was present and scrubbed for the entire case   ASSISTANT SURGEON: None.   ANESTHESIA: General with postoperative digital block  SURGICAL PROCEDURES: 1.  Left middle finger revision amputation through the midportion of the middle phalanx 2.  Left middle finger distal phalanx bone biopsy 3.  Irrigation debridement of left middle finger flexor tendon sheath extending into the palm  SURGICAL INDICATIONS: Patient is a 53 year old male who presented to the ER earlier today.  He states that several months ago he had a partial amputation to the tip of the digit that was treated in [place].  He had some wound healing issues and had been doing well up until the past week.  He started to notice some drainage from the tip of the digit and over the past 24 hours developed increased swelling and redness to the finger.  He presented to the ER where radiographs were obtained which showed lysis of the remaining portion of the distal phalanx which was concerning for osteomyelitis.  After discussing treatment options the patient did wish to proceed forward with irrigation debridement of the finger, revision amputation and any indicated surgeries.  FINDING: There was gross purulence throughout the entire digit both palmarly and dorsally.  This extended through the flexor tendon sheath into the palm.  Successful irrigation debridement of the finger and flexor tendon sheath was performed.  A subsequent revision amputation of the finger was also performed removing the distal, grossly infected portions of the finger.  This resulted in a amputation level at the midportion of the middle phalanx.  Tension-free closure was achieved.  DESCRIPTION OF PROCEDURE: The patient was identified in the [place]  holding area where the risk benefits and alternatives of the procedure were discussed with the patient.  These include but are not limited to infection, bleeding, damage to surrounding structures including blood vessels and nerves, pain, stiffness, recurrence and need for additional procedures.  Informed consent was obtained at that time and the patient's left hand was marked with a surgical marking pen.  He was then brought back to the operative suite where timeout was performed identifying the correct patient operative site.  He was positioned supine on the operative table and induced under general anesthesia.  A tourniquet was placed on the upper arm and the arm was then prepped and draped in usual sterile fashion.  The limb was exsanguinated by gravity and the tourniquet was inflated.  A fishmouth incision was began of the long mid axial planes at the tip of the digit.  Palmar and dorsal skin flaps were developed and there was abundant purulent material which was cultured for both aerobic and anaerobic specimens.  There was an abundant amount of infected soft tissues including the skin and subcutaneous tissue which were debrided using curette and rondure.  The remaining portion of the distal phalanx was sharply excised and sent to pathology for bone biopsy.  There is also an abundant amount of fluid within the flexor tendon sheath and with palpation of the sheath in the palm, purulent feel it could be expressed at the tip of the digit.  The decision was then made to extend the incision.  The incision was converted to a [place] type incision was extended along the volar aspect of the finger into the palm.  Skin flaps were elevated and the flexor tendon  sheath was visualized.  There was purulent material about the flexor tendons throughout the flexor tendon sheath.  All obvious necrotic and infected material was debrided both sharply as well as with a curette and rondure.  The wound was then copiously irrigated  with normal saline including irrigation of the flexor tendon sheath using cystoscopy tubing.  Once this had been accomplished the distal, necrotic skin edges were debrided.  A bone biter was used to shorten the middle phalanx at its midportion.  Smooth contour of the remaining phalanx was achieved.  The distal skin which was infected was resected and all skin was closed with interrupted 4-0 Prolene sutures in a tension-free manner.  Volar and dorsal skin flaps were closed over top of the amputated middle phalanx.  The tourniquet was released and the volar skin flap had return of brisk capillary refill.  The wounds were dressed with Xeroform, 4 x 4's and a soft dressing.  The patient was awoken from his anesthesia and extubated in the operating room without any complications.  He was taken to the PACU in stable condition.  ESTIMATED BLOOD LOSS: 20 mL  TOURNIQUET TIME: Less than an hour  SPECIMENS: Aerobic and anaerobic cultures.  Distal phalanx bone biopsy  POSTOPERATIVE PLAN: The patient will be admitted to the medicine service for postoperative IV antibiotics.  We will follow-up his cultures and biopsy to determine a treatment plan going forward.  Based on the severe infection to the digit I do recommend infectious disease consult for a antibiotic plan going forward.  IMPLANTS: None

## 2019-09-23 NOTE — Anesthesia Preprocedure Evaluation (Addendum)
Anesthesia Evaluation  Patient identified by MRN, date of birth, ID band Patient awake    Reviewed: Allergy & Precautions, NPO status , Patient's Chart, lab work & pertinent test results  Airway Mallampati: II  TM Distance: >3 FB     Dental  (+) Dental Advisory Given   Pulmonary neg pulmonary ROS,    breath sounds clear to auscultation       Cardiovascular negative cardio ROS   Rhythm:Regular Rate:Normal     Neuro/Psych negative neurological ROS  negative psych ROS   GI/Hepatic negative GI ROS, Neg liver ROS,   Endo/Other  negative endocrine ROS  Renal/GU negative Renal ROS     Musculoskeletal Crush injury to hand Finger osteomyelitis, left    Abdominal   Peds  Hematology negative hematology ROS (+)   Anesthesia Other Findings Left long finger infection  Reproductive/Obstetrics                            Anesthesia Physical Anesthesia Plan  ASA: II and emergent  Anesthesia Plan: General   Post-op Pain Management:    Induction: Intravenous  PONV Risk Score and Plan: 2 and Dexamethasone, Ondansetron and Treatment may vary due to age or medical condition  Airway Management Planned: LMA  Additional Equipment:   Intra-op Plan:   Post-operative Plan: Extubation in OR  Informed Consent: I have reviewed the patients History and Physical, chart, labs and discussed the procedure including the risks, benefits and alternatives for the proposed anesthesia with the patient or authorized representative who has indicated his/her understanding and acceptance.     Dental advisory given  Plan Discussed with: CRNA  Anesthesia Plan Comments:        Anesthesia Quick Evaluation

## 2019-09-23 NOTE — Transfer of Care (Signed)
Immediate Anesthesia Transfer of Care Note  Patient: Carlos [name]  Procedure(s) Performed: IRRIGATION AND DEBRIDEMENT OF  LEFT LONG FINGER, REVISION OF LEFT LONG FINGER AMPUTATION (Left Finger)  Patient Location: PACU  Anesthesia Type:General  Level of Consciousness: awake, oriented and sedated  Airway & Oxygen Therapy: Patient Spontanous Breathing  Post-op Assessment: Report given to RN and Post -op Vital signs reviewed and stable  Post vital signs: Reviewed  Last Vitals:  Vitals Value Taken Time  BP 117/75 09/23/19 1951  Temp    Pulse    Resp 18 09/23/19 1952  SpO2    Vitals shown include unvalidated device data.  Last Pain:  Vitals:   09/23/19 1456  TempSrc:   PainSc: 0-No pain         Complications: No apparent anesthesia complications

## 2019-09-23 NOTE — Plan of Care (Signed)

## 2019-09-23 NOTE — Progress Notes (Signed)
Pharmacy Antibiotic Note  Carlos [name] is a 53 y.o. male admitted on 09/23/2019 with left middle finger injury.  Pharmacy has been consulted for vancomycin dosing for osteomyelitis. X-ray on 09/23/2019 found an area of loss of cortex in area of previous amputation which may be of concern for osteomyelitis. WBC 18.1. Afebrile. Scr 0.85 with current CrCl of 123 ml/min.   Vancomycin 1500 mg IV Q 12 hrs. Goal AUC 400-550. Expected AUC: 465 SCr used: 0.85 Weight for CrCl & Ke: TBW (because TBW < IBW)   Plan: Vancomycin 1750mg  IV x1 loading dose  Start vancomycin 1500mg  IV q12h (next dose 0400 on 11/20) Obtain vancomycin levels as indicated Monitor renal function, clinical progression and cultures if obtained     Temp (24hrs), Avg:98.5 F (36.9 C), Min:98.5 F (36.9 C), Max:98.5 F (36.9 C)  Recent Labs  Lab 09/23/19 1339  WBC 18.1*  CREATININE 0.85    CrCl cannot be calculated (Unknown ideal weight.).    Allergies  Allergen Reactions  . Penicillins     hives    Antimicrobials this admission: Vancomycin 11/19 >>   Dose adjustments this admission: N/A  Microbiology results: 11/19 COVID: ordered  Thank you for allowing pharmacy to be a part of this patient's care.   Cristela Felt, PharmD PGY1 Pharmacy Resident [employer]: [phone]  09/23/2019 2:49 PM

## 2019-09-23 NOTE — Anesthesia Postprocedure Evaluation (Signed)
Anesthesia Post Note  Patient: Zennon Freet  Procedure(s) Performed: IRRIGATION AND DEBRIDEMENT OF  LEFT LONG FINGER, REVISION OF LEFT LONG FINGER AMPUTATION (Left Finger)     Patient location during evaluation: PACU Anesthesia Type: General Level of consciousness: awake and alert Pain management: pain level controlled Vital Signs Assessment: post-procedure vital signs reviewed and stable Respiratory status: spontaneous breathing, nonlabored ventilation, respiratory function stable and patient connected to nasal cannula oxygen Cardiovascular status: blood pressure returned to baseline and stable Postop Assessment: no apparent nausea or vomiting Anesthetic complications: no    Last Vitals:  Vitals:   09/23/19 2015 09/23/19 2033  BP:  113/71  Pulse:  81  Resp:  14  Temp:  37.2 C  SpO2: 100% 100%    Last Pain:  Vitals:   09/23/19 2033  TempSrc: Oral  PainSc: 0-No pain    LLE Motor Response: (P) Purposeful movement;Responds to commands (09/23/19 2052) LLE Sensation: (P) Full sensation (09/23/19 2052) RLE Motor Response: (P) Purposeful movement;Responds to commands (09/23/19 2052) RLE Sensation: (P) Full sensation (09/23/19 2052)      Tiajuana Amass

## 2019-09-24 ENCOUNTER — Encounter (HOSPITAL_COMMUNITY): Payer: Self-pay | Admitting: Orthopaedic Surgery

## 2019-09-24 LAB — CBC
HCT: 37.7 % — ABNORMAL LOW (ref 39.0–52.0)
Hemoglobin: 12.2 g/dL — ABNORMAL LOW (ref 13.0–17.0)
MCH: 27.2 pg (ref 26.0–34.0)
MCHC: 32.4 g/dL (ref 30.0–36.0)
MCV: 84.2 fL (ref 80.0–100.0)
Platelets: 193 10*3/uL (ref 150–400)
RBC: 4.48 MIL/uL (ref 4.22–5.81)
RDW: 12.7 % (ref 11.5–15.5)
WBC: 16.4 10*3/uL — ABNORMAL HIGH (ref 4.0–10.5)
nRBC: 0 % (ref 0.0–0.2)

## 2019-09-24 LAB — HIV ANTIBODY (ROUTINE TESTING W REFLEX): HIV Screen 4th Generation wRfx: NONREACTIVE

## 2019-09-24 MED ORDER — VANCOMYCIN HCL 10 G IV SOLR
1500.0000 mg | Freq: Two times a day (BID) | INTRAVENOUS | Status: DC
  Administered 2019-09-25 – 2019-09-26 (×3): 1500 mg via INTRAVENOUS
  Filled 2019-09-24 (×6): qty 1500

## 2019-09-24 MED ORDER — VANCOMYCIN HCL 10 G IV SOLR
1750.0000 mg | Freq: Once | INTRAVENOUS | Status: AC
  Administered 2019-09-24: 1750 mg via INTRAVENOUS
  Filled 2019-09-24: qty 1750

## 2019-09-24 NOTE — Progress Notes (Signed)
[facility] PROGRESS NOTE  Carlos [name] [id number] DOB: Nov 21, 1965 DOA: 09/23/2019 PCP: Patient, No Pcp Per   Subjective  He feels okay today, denies any fever or chills. He is on clindamycin, per Ortho recommendation we will consult ID.  Brief history  Carlos [name] is a 53 y.o. male with medical history significant of crush injury to the bilateral hands in February 2020 it resulted in several broken fingers and decrease of sensation in his hands.  Unfortunately his left long finger developed an osteomyelitis and he was treated conservatively but that did not resolve.  He required a partial amputation of his finger and seemed to have resolved.  He was in his usual state of health until 1 week ago when he noted drainage from his left long finger.  This morning he awoke with redness and swelling of the finger without any lymphangitis.  He has had no fevers.  His lack of sensation probably contributed to the late presentation.  X-rays showed osteomyelitis of the distal phalanx of the left long finger.  Hand surgery has been consulted and has seen the patient.  They asked that we admit the patient for further evaluation and management.  Assessment and plan   Principal Problem:   Finger osteomyelitis, left (HCC) Active Problems:   Leukocytosis   Left third finger acute osteomyelitis Status post partial amputation done by Dr. Charlane Ferretti on 09/23/2019. Had previous partial amputation done after osteomyelitis and crushing injury to the hand. Currently on clindamycin, will ask ID for antibiotics recommendation.  Leukocytosis This is secondary to the acute osteomyelitis  Code Status: Full Code Family Communication: Plan discussed with the patient. Disposition Plan: Remains inpatient Diet:  Diet Order            Diet regular Room service appropriate? Yes; Fluid consistency: Thin  Diet effective now              Consultants   Ortho  ID  Procedures   Partial amputation  of the left long finger done by Dr. Jeannie Fend on 1119.  Antibiotics   Clindamycin  Objective   Vitals:   09/24/19 0337 09/24/19 0832  BP: 111/62 117/71  Pulse: 89 64  Resp: 18 16  Temp: (!) 97.5 F (36.4 C) 97.8 F (36.6 C)  SpO2: 100% 99%    Intake/Output Summary (Last 24 hours) at 09/24/2019 1208 Last data filed at 09/24/2019 0900 Gross per 24 hour  Intake 1140 ml  Output 15 ml  Net 1125 ml   Filed Weights   09/23/19 1400 09/23/19 1502  Weight: 84.4 kg 84.4 kg    Physical examination  General: Alert and awake, oriented x3, not in any acute distress. HEENT: anicteric sclera, pupils reactive to light and accommodation, EOMI CVS: S1-S2 clear, no murmur rubs or gallops Chest: clear to auscultation bilaterally, no wheezing, rales or rhonchi Abdomen: soft nontender, nondistended, normal bowel sounds, no organomegaly Extremities: no cyanosis, clubbing or edema noted bilaterally Neuro: Cranial nerves II-XII intact, no focal neurological deficits  Reviewed data  Basic Metabolic Panel: Recent Labs  Lab 09/23/19 1339  NA 138  K 4.8  CL 104  CO2 27  GLUCOSE 88  BUN 14  CREATININE 0.85  CALCIUM 8.7*   Liver Function Tests: No results for input(s): AST, ALT, ALKPHOS, BILITOT, PROT, ALBUMIN in the last 168 hours. No results for input(s): LIPASE, AMYLASE in the last 168 hours. No results for input(s): AMMONIA in the last 168 hours. CBC: Recent Labs  Lab 09/23/19 1339 09/24/19  0333  WBC 18.1* 16.4*  NEUTROABS 14.5*  --   HGB 13.2 12.2*  HCT 41.9 37.7*  MCV 87.1 84.2  PLT 214 193   Cardiac Enzymes: No results for input(s): CKTOTAL, CKMB, CKMBINDEX, TROPONINI in the last 168 hours. BNP (last 3 results) No results for input(s): BNP in the last 8760 hours.  ProBNP (last 3 results) No results for input(s): PROBNP in the last 8760 hours.  CBG: No results for input(s): GLUCAP in the last 168 hours.  Micro Recent Results (from the past 240 hour(s))  SARS  Coronavirus 2 by RT PCR (hospital order, performed in [facility] hospital lab) Nasopharyngeal Nasopharyngeal Swab     Status: None   Collection Time: 09/23/19  3:16 PM   Specimen: Nasopharyngeal Swab  Result Value Ref Range Status   SARS Coronavirus 2 NEGATIVE NEGATIVE Final    Comment: (NOTE) If result is NEGATIVE SARS-CoV-2 target nucleic acids are NOT DETECTED. The SARS-CoV-2 RNA is generally detectable in upper and lower  respiratory specimens during the acute phase of infection. The lowest  concentration of SARS-CoV-2 viral copies this assay can detect is 250  copies / mL. A negative result does not preclude SARS-CoV-2 infection  and should not be used as the sole basis for treatment or other  patient management decisions.  A negative result may occur with  improper specimen collection / handling, submission of specimen other  than nasopharyngeal swab, presence of viral mutation(s) within the  areas targeted by this assay, and inadequate number of viral copies  (<250 copies / mL). A negative result must be combined with clinical  observations, patient history, and epidemiological information. If result is POSITIVE SARS-CoV-2 target nucleic acids are DETECTED. The SARS-CoV-2 RNA is generally detectable in upper and lower  respiratory specimens dur ing the acute phase of infection.  Positive  results are indicative of active infection with SARS-CoV-2.  Clinical  correlation with patient history and other diagnostic information is  necessary to determine patient infection status.  Positive results do  not rule out bacterial infection or co-infection with other viruses. If result is PRESUMPTIVE POSTIVE SARS-CoV-2 nucleic acids MAY BE PRESENT.   A presumptive positive result was obtained on the submitted specimen  and confirmed on repeat testing.  While 2019 novel coronavirus  (SARS-CoV-2) nucleic acids may be present in the submitted sample  additional confirmatory testing may be  necessary for epidemiological  and / or clinical management purposes  to differentiate between  SARS-CoV-2 and other Sarbecovirus currently known to infect humans.  If clinically indicated additional testing with an alternate test  methodology [phone]) is advised. The SARS-CoV-2 RNA is generally  detectable in upper and lower respiratory sp ecimens during the acute  phase of infection. The expected result is Negative. Fact Sheet for Patients:  StrictlyIdeas.no Fact Sheet for Healthcare Providers: BankingDealers.co.za This test is not yet approved or cleared by the [place] FDA and has been authorized for detection and/or diagnosis of SARS-CoV-2 by FDA under an Emergency Use Authorization (EUA).  This EUA will remain in effect (meaning this test can be used) for the duration of the COVID-19 declaration under Section 564(b)(1) of the Act, 21 U.S.C. section 360bbb-3(b)(1), unless the authorization is terminated or revoked sooner. Performed at [facility], [place] [address] [place], [place] [address]   Aerobic/Anaerobic Culture (surgical/deep wound)     Status: None (Preliminary result)   Collection Time: 09/23/19  6:40 PM   Specimen: PATH Bone resection; Tissue  Result Value Ref Range Status   Specimen Description TISSUE BONE LEFT FINGER  Final   Special Requests LEFT MIDDLE FINGER  Final   Gram Stain   Final    FEW WBC PRESENT, PREDOMINANTLY PMN ABUNDANT GRAM POSITIVE COCCI    Culture   Final    TOO YOUNG TO READ Performed at [facility], [place] [address] [place], [place] [address]    Report Status PENDING  Incomplete  Aerobic/Anaerobic Culture (surgical/deep wound)     Status: None (Preliminary result)   Collection Time: 09/23/19  6:56 PM   Specimen: Wound; Tissue  Result Value Ref Range Status   Specimen Description WOUND LEFT FINGER  Final   Special Requests SWAB OF LEFT MIDDLE FINGER TIS  Final   Gram Stain    Final    MODERATE WBC PRESENT, PREDOMINANTLY PMN ABUNDANT GRAM POSITIVE COCCI    Culture   Final    TOO YOUNG TO READ Performed at [facility], [place] [address], [place], [place] [address]    Report Status PENDING  Incomplete     Radiological studies, reviewed  Dg Finger Middle Left  Result Date: 09/23/2019 CLINICAL DATA:  Pain and swelling distally EXAM: LEFT THIRD FINGER 2+V COMPARISON:  None. FINDINGS: Frontal, oblique, and lateral views were obtained. There is been previous amputation of most of the third distal phalanx. Within the area of remaining distal third phalanx, there is an area of apparent loss of cortex which is concerning for potential underlying osteomyelitis. This area of suspected bony destruction does not extend to the joint space. There are several small bony fragments in this area which are probably of posttraumatic etiology. There is a focus of calcification volar to the midportion of the third middle phalanx which may represent residua of prior trauma. Bones elsewhere appear intact. No acute fracture or dislocation. No joint space narrowing or erosive change. No soft tissue air. 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 There is an area of loss of cortex in this area of amputation which must be of concern for focal osteomyelitis. This small focus of apparent bony destruction does not reach the DIP joint space. There are fragments of bone in this area which are likely of posttraumatic etiology. A small focus of calcification volar to the third middle phalanx is likely of posttraumatic etiology. No acute fracture or dislocation. No joint space narrowing or joint erosion. From an imaging standpoint, MR potentially could be helpful to further evaluate this area in the third distal phalanx remnant concerning for small focus of destruction. Electronically Signed   By: Lowella Grip III M.D.   On: 09/23/2019 13:35    Scheduled  Meds:  enoxaparin (LOVENOX) injection  40 mg Subcutaneous Q24H   Continuous Infusions:  0.9 % NaCl with KCl 20 mEq / L     lactated ringers 10 mL/hr at 09/23/19 2050     Time spent: 35 minutes  [facility] [facility] Pager [phone] If 7PM-7AM, please contact night-coverage at [email or web address], password [facility] 09/24/2019, 12:08 PM  LOS: 1 day

## 2019-09-24 NOTE — Progress Notes (Signed)
PT Progress Note for Charges    09/24/19 1100  PT Visit Information  Last PT Received On 09/24/19  PT General Charges  $$ ACUTE PT VISIT 1 Visit  PT Evaluation  $PT Eval Low Complexity 1 Low  PT Treatments  $Gait Training 8-22 mins  Anastasio Champion, DPT  Acute Rehabilitation Services Pager [phone] Office [phone]

## 2019-09-24 NOTE — Evaluation (Signed)
Occupational Therapy Evaluation Patient Details Name: Carlos [name] MRN: [id number] DOB: 07-10-1966 Today's Date: 09/24/2019    History of Present Illness Carlos [name] is a 53 y.o. M s/p irrigation and debridement of L 3rd finger, revision of L 3rd finger amputation. He has a PMH including but not limited to L 3rd finger osteomyelitis.    Clinical Impression   Pt is at Mod I level with ADLs and selfcare and independent with mobility. L hand does not impede or hinder ADL function at this time. Pt able to perform flexion, extension and opposition of L digits WNL without pain. All education completed and no further OT is indicated at this time    Follow Up Recommendations  Follow surgeon's recommendation for DC plan and follow-up therapies(outpatient OT for L hand as appropriate per MD)    Equipment Recommendations  None recommended by OT    Recommendations for Other Services       Precautions / Restrictions Precautions Precautions: None Restrictions Weight Bearing Restrictions: No      Mobility Bed Mobility               General bed mobility comments: pt in recliner upon arrival. Per PT note, pt is Mod I with bed mobility  Transfers Overall transfer level: Independent Equipment used: None                  Balance Overall balance assessment: Independent                                         ADL either performed or assessed with clinical judgement   ADL Overall ADL's : Needs assistance/impaired Eating/Feeding: Set up;Sitting Eating/Feeding Details (indicate cue type and reason): impaired use of L hand Grooming: Wash/dry hands;Wash/dry face;Oral care;Modified independent   Upper Body Bathing: Modified independent   Lower Body Bathing: Modified independent   Upper Body Dressing : Modified independent   Lower Body Dressing: Modified independent   Toilet Transfer: Independent   Toileting- Clothing Manipulation and Hygiene: Modified  independent       Functional mobility during ADLs: Independent       Vision Patient Visual Report: No change from baseline       Perception     Praxis      Pertinent Vitals/Pain Pain Assessment: 0-10 Pain Score: 1  Pain Location: L hand/3rd finger Pain Descriptors / Indicators: Sore Pain Intervention(s): Monitored during session     Hand Dominance Left   Extremity/Trunk Assessment Upper Extremity Assessment Upper Extremity Assessment: LUE deficits/detail LUE Deficits / Details: 3rd finger in splint LUE: Unable to fully assess due to immobilization           Communication Communication Communication: No difficulties   Cognition Arousal/Alertness: Awake/alert Behavior During Therapy: WFL for tasks assessed/performed Overall Cognitive Status: Within Functional Limits for tasks assessed                                     General Comments       Exercises Other Exercises Other Exercises: pt educated on L hand/finger ROM in felxion extension and opposition and pt able to return demo   Shoulder Instructions      Home Living Family/patient expects to be discharged to:: Private residence Living Arrangements: Parent;Children Available Help at Discharge: Family Type of Home: House  Home Access: Stairs to enter Entrance Stairs-Number of Steps: 12 Entrance Stairs-Rails: Left Home Layout: Two level;Laundry or work area in basement;Other (Comment)(pt resides in basement with full bathroom)     Bathroom Shower/Tub: Teacher, early years/pre: Standard Bathroom Accessibility: No   Home Equipment: None          Prior Functioning/Environment Level of Independence: Independent  Gait / Transfers Assistance Needed: none ADL's / Homemaking Assistance Needed: was independent with ADLs/selfcare, home mgt, cooking and drives            OT Problem List: Impaired UE functional use;Decreased coordination;Pain      OT  Treatment/Interventions:      OT Goals(Current goals can be found in the care plan section) Acute Rehab OT Goals Patient Stated Goal: to improve use of hands OT Goal Formulation: With patient  OT Frequency:     Barriers to D/C:            Co-evaluation              AM-PAC OT "6 Clicks" Daily Activity     Outcome Measure Help from another person eating meals?: None Help from another person taking care of personal grooming?: None Help from another person toileting, which includes using toliet, bedpan, or urinal?: None Help from another person bathing (including washing, rinsing, drying)?: None Help from another person to put on and taking off regular upper body clothing?: None Help from another person to put on and taking off regular lower body clothing?: None 6 Click Score: 24   End of Session    Activity Tolerance: Patient tolerated treatment well Patient left: in chair  OT Visit Diagnosis: Pain Pain - Right/Left: Left Pain - part of body: Hand                Time: 1207-1232 OT Time Calculation (min): 25 min Charges:  OT General Charges $OT Visit: 1 Visit OT Evaluation $OT Eval Low Complexity: 1 Low OT Treatments $Therapeutic Activity: 8-22 mins    Britt Bottom 09/24/2019, 1:54 PM

## 2019-09-24 NOTE — Plan of Care (Signed)

## 2019-09-24 NOTE — Consult Note (Signed)
[facility]    Date of Admission:  09/23/2019     Total days of antibiotics 1               Reason for Consult: Osteomyelitis   Referring Provider: [facility] Primary Care Provider: Patient, No Pcp Per   ASSESSMENT:  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 POD 1 and has been afebrile. Surgical specimens with gram positive cocci on gram stain and cultures too young to read. Will start on vancomycin as would anticipate staph/strep. Narrow antibiotics as able pending culture results. Given severity of infection wound recommend prolonged antibiotic therapy likely will need PICC line placement.    PLAN:  1. Start vancomycin per pharmacy consult. 2. Monitor renal function while on vancomycin.  3. Wound care per Orthopedics.    Principal Problem:   Finger osteomyelitis, left (HCC) Active Problems:   Leukocytosis    enoxaparin (LOVENOX) injection  40 mg Subcutaneous Q24H     HPI: Carlos [name] is a 53 y.o. male with previous medical history significant for crush injury to his bilateral hands sustained in February 2020 resulting in several broken fingers and complicated by the development of osteomyelitis of the left long finger failing conservative treatment and requiring partial amputation now admitted with new drainage of about 1 weeks duration from the same left long finger. On morning of arrival he had increased redness and swelling with no fevers or chills.   X-rays on admission with an area of loss of cortex in the remaining third distal phalanx. Brought to the OR on 09/23/19  For left middle finger revision amputation through midpoint of middle phalanx with irrigation and debridement. Gross purulence was encountered and cultured during the procedure. He was given one dose of clindamycin for surgical prophylaxis.     Carlos [name] has been afebrile since admission. Surgical specimens showing gram positive cocci on gram stain and cultures are currently too young to read.   Carlos [name] noted in the past week that he began to have drainage from his left 3rd finger. Denies any fevers, chills or sweats. The worsening redness, swelling and odor is what brought him to the hospital. 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 Moved from [place] to [place] and currently living with family.    Review of Systems: Review of Systems  Constitutional: Negative for chills, fever and weight loss.  Respiratory: Negative for cough, shortness of breath and wheezing.   Cardiovascular: Negative for chest pain and leg swelling.  Gastrointestinal: Negative for abdominal pain, constipation, diarrhea, nausea and vomiting.  Skin: Negative for rash.     Past Medical History:  Diagnosis Date   Crush injury to hand 12/2018   bilateral hands with multipe fractures   Finger osteomyelitis, left ([place]) 12/2018    Social History   Tobacco Use   Smoking status: Never Smoker   Smokeless tobacco: Never Used  Substance Use Topics   Alcohol use: Yes    Alcohol/week: 2.0 standard drinks    Types: 2 Cans of beer per week   Drug use: No    Family History  Problem Relation Age of Onset   Breast cancer Mother        DCIS times 2    Allergies  Allergen Reactions   Penicillins Hives    Did  it involve swelling of the face/tongue/throat, SOB, or low No Did it involve sudden or severe rash/hives, skin peeling, or any reaction on the inside of your mouth or nose?Yes Did you need to seek medical attention at a hospital or doctor's office? Yes When did it last happen?April 2020 If all above answers are NO, may proceed with cephalosporin use.    OBJECTIVE: Blood pressure 120/71, pulse 70, temperature 98.3 F (36.8 C), temperature source Oral, resp. rate 17, height 6\' 4"  (1.93 m), weight 84.4  kg, SpO2 100 %.  Physical Exam Constitutional:      General: He is not in acute distress.    Appearance: He is well-developed.     Comments: Seated in bed; pleasant.   Cardiovascular:     Rate and Rhythm: Normal rate and regular rhythm.     Heart sounds: Normal heart sounds.  Pulmonary:     Effort: Pulmonary effort is normal.     Breath sounds: Normal breath sounds.  Musculoskeletal:     Comments: Surgical wrap/dressing on left hand is clean and dry.   Skin:    General: Skin is warm and dry.  Neurological:     Mental Status: He is alert and oriented to person, place, and time.     Comments: Decreased sensation in bilateral hands (since initial trauma)  Psychiatric:        Behavior: Behavior normal.        Thought Content: Thought content normal.        Judgment: Judgment normal.     Lab Results Lab Results  Component Value Date   WBC 16.4 (H) 09/24/2019   HGB 12.2 (L) 09/24/2019   HCT 37.7 (L) 09/24/2019   MCV 84.2 09/24/2019   PLT 193 09/24/2019    Lab Results  Component Value Date   CREATININE 0.85 09/23/2019   BUN 14 09/23/2019   NA 138 09/23/2019   K 4.8 09/23/2019   CL 104 09/23/2019   CO2 27 09/23/2019   No results found for: ALT, AST, GGT, ALKPHOS, BILITOT   Microbiology: Recent Results (from the past 240 hour(s))  SARS Coronavirus 2 by RT PCR (hospital order, performed in [place] hospital lab) Nasopharyngeal Nasopharyngeal Swab     Status: None   Collection Time: 09/23/19  3:16 PM   Specimen: Nasopharyngeal Swab  Result Value Ref Range Status   SARS Coronavirus 2 NEGATIVE NEGATIVE Final    Comment: (NOTE) If result is NEGATIVE SARS-CoV-2 target nucleic acids are NOT DETECTED. The SARS-CoV-2 RNA is generally detectable in upper and lower  respiratory specimens during the acute phase of infection. The lowest  concentration of SARS-CoV-2 viral copies this assay can detect is 250  copies / mL. A negative result does not preclude SARS-CoV-2 infection    and should not be used as the sole basis for treatment or other  patient management decisions.  A negative result may occur with  improper specimen collection / handling, submission of specimen other  than nasopharyngeal swab, presence of viral mutation(s) within the  areas targeted by this assay, and inadequate number of viral copies  (<250 copies / mL). A negative result must be combined with clinical  observations, patient history, and epidemiological information. If result is POSITIVE SARS-CoV-2 target nucleic acids are DETECTED. The SARS-CoV-2 RNA is generally detectable in upper and lower  respiratory specimens dur ing the acute phase of infection.  Positive  results are indicative of active infection with SARS-CoV-2.  Clinical  correlation with patient  history and other diagnostic information is  necessary to determine patient infection status.  Positive results do  not rule out bacterial infection or co-infection with other viruses. If result is PRESUMPTIVE POSTIVE SARS-CoV-2 nucleic acids MAY BE PRESENT.   A presumptive positive result was obtained on the submitted specimen  and confirmed on repeat testing.  While 2019 novel coronavirus  (SARS-CoV-2) nucleic acids may be present in the submitted sample  additional confirmatory testing may be necessary for epidemiological  and / or clinical management purposes  to differentiate between  SARS-CoV-2 and other Sarbecovirus currently known to infect humans.  If clinically indicated additional testing with an alternate test  methodology [phone]) is advised. The SARS-CoV-2 RNA is generally  detectable in upper and lower respiratory sp ecimens during the acute  phase of infection. The expected result is Negative. Fact Sheet for Patients:  StrictlyIdeas.no Fact Sheet for Healthcare Providers: BankingDealers.co.za This test is not yet approved or cleared by the [place] FDA  and has been authorized for detection and/or diagnosis of SARS-CoV-2 by FDA under an Emergency Use Authorization (EUA).  This EUA will remain in effect (meaning this test can be used) for the duration of the COVID-19 declaration under Section 564(b)(1) of the Act, 21 U.S.C. section 360bbb-3(b)(1), unless the authorization is terminated or revoked sooner. Performed at [facility], [place] [address], [place], [place] [address]   Aerobic/Anaerobic Culture (surgical/deep wound)     Status: None (Preliminary result)   Collection Time: 09/23/19  6:40 PM   Specimen: PATH Bone resection; Tissue  Result Value Ref Range Status   Specimen Description TISSUE BONE LEFT FINGER  Final   Special Requests LEFT MIDDLE FINGER  Final   Gram Stain   Final    FEW WBC PRESENT, PREDOMINANTLY PMN ABUNDANT GRAM POSITIVE COCCI    Culture   Final    TOO YOUNG TO READ Performed at [facility], [place] [address] [place], [place] [address]    Report Status PENDING  Incomplete  Aerobic/Anaerobic Culture (surgical/deep wound)     Status: None (Preliminary result)   Collection Time: 09/23/19  6:56 PM   Specimen: Wound; Tissue  Result Value Ref Range Status   Specimen Description WOUND LEFT FINGER  Final   Special Requests SWAB OF LEFT MIDDLE FINGER TIS  Final   Gram Stain   Final    MODERATE WBC PRESENT, PREDOMINANTLY PMN ABUNDANT GRAM POSITIVE COCCI    Culture   Final    TOO YOUNG TO READ Performed at [facility], [place] [address], [place], [place] [address]    Report Status PENDING  Incomplete     Terri Piedra, [facility] [phone] Pager  09/24/2019  2:44 PM

## 2019-09-24 NOTE — Plan of Care (Signed)

## 2019-09-24 NOTE — Progress Notes (Signed)
Pharmacy Antibiotic Note  Carlos [name] is a 53 y.o. male admitted on 09/23/2019 with left long finger pain and swelling.  Pt had crush injury to both hands in Feb 2020, resulting in several broken fingers and decrease of sensation in hands. His left long finger developed osteo, which did not resolve with conservative treatment, so pt had partial amputation. Approx 1 week ago, pt noted drainage from the finger, and now has swelling redness. X-rays showed osteo of distal phalanx of left long finger. Pt went to OR yesterday for left middle finger revision amputation, bone biopsy, and I & D of flexor tendon sheath extending into the palm. Surgical cx were taken, and clindamycin was administered for 2 doses. ID has been consulted. Pharmacy has been consulted for vancomycin dosing.  WBC 16.4, afebrile, Scr 0.85, CrCl 122.7 ml/min  Plan: Vancomycin 1750 mg IV X 1, followed by vancomycin 1500 mg IV Q 12 hrs (estimated vancomycin AUC on this regimen, using Scr 0.85, is 464.5; goal vancomycin AUC is 400-550) Monitor WBC, temp, clinical improvement, cx, vancomycin levels, renal function  Height: 6\' 4"  (193 cm) Weight: 186 lb 1.1 oz (84.4 kg) IBW/kg (Calculated) : 86.8  Temp (24hrs), Avg:98 F (36.7 C), Min:97.2 F (36.2 C), Max:99 F (37.2 C)  Recent Labs  Lab 09/23/19 1339 09/24/19 0333  WBC 18.1* 16.4*  CREATININE 0.85  --     Estimated Creatinine Clearance: 122.7 mL/min (by C-G formula based on SCr of 0.85 mg/dL).    Allergies  Allergen Reactions  . Penicillins Hives    Did it involve swelling of the face/tongue/throat, SOB, or low No Did it involve sudden or severe rash/hives, skin peeling, or any reaction on the inside of your mouth or nose?Yes Did you need to seek medical attention at a hospital or doctor's office? Yes When did it last happen?April 2020 If all above answers are "NO", may proceed with cephalosporin use.    Antimicrobials this admission: 11/19 Clindamycin X 2  doses  Microbiology results: 11/19 Tissue bone, left middle finger: few WBC (PMNs), abundant GPC; cx pending 11/19 Wound left finger (swab): mod WBC (PMNs), abundant GPC; cx pending 11/19 COVID: negative 11/20 HIV: negative  Thank you for allowing pharmacy to be a part of this patient's care.  Gillermina Hu, PharmD, BCPS, [facility] Clinical Pharmacist 09/24/2019 3:16 PM

## 2019-09-24 NOTE — Evaluation (Signed)
Physical Therapy Evaluation Patient Details Name: Carlos [name] MRN: [id number] DOB: Jan 27, 1966 Today's Date: 09/24/2019   History of Present Illness  Carlos [name] is a 53 y.o. M s/p irrigation and debridement of L 3rd finger, revision of L 3rd finger amputation. He has a PMH including but not limited to L 3rd finger osteomyelitis.   Clinical Impression  Pt very pleasant upon arrival and agreeable to PT evaluation. He demonstrates good functional mobility during bed mobility, transfers, and gait assessment. He required assistance with IV pole management during gait, but brought it into the bathroom by himself and demonstrated good safety awareness throughout the session. He did not require any AD with ambulation and navigated stairs well. Due to lack of sensation in hands, he had some difficulty gripping the railings during stair navigation, but feel this will be better addressed with OT. He had 5/5 B LE strength and no LE sensation deficits. Pt was given education on importance of completing regular skin checks on B hands due to lack of sensation. Due to independence with functional movement and overall strength, no further PT recommended at this time.     Follow Up Recommendations No PT follow up    Equipment Recommendations  None recommended by PT    Recommendations for Other Services OT consult     Precautions / Restrictions Precautions Precautions: None Restrictions Weight Bearing Restrictions: No      Mobility  Bed Mobility Overal bed mobility: Modified Independent Bed Mobility: Supine to Sit     Supine to sit: Modified independent (Device/Increase time)     General bed mobility comments: pt able to perform bed mobility without assistance but requires some help for line management  Transfers Overall transfer level: Independent Equipment used: None             General transfer comment: pt able to complete sit to stand without increased time or effort and with good  safety awareness  Ambulation/Gait Ambulation/Gait assistance: Modified independent (Device/Increase time)(for IV pole management) Gait Distance (Feet): 400 Feet Assistive device: None Gait Pattern/deviations: WFL(Within Functional Limits) Gait velocity: normal Gait velocity interpretation: >4.37 ft/sec, indicative of normal walking speed General Gait Details: pt demonstrates good gait speed with no LOB or AD required.   Stairs Stairs: Yes Stairs assistance: Supervision Stair Management: One rail Right;One rail Left Number of Stairs: 10 General stair comments: Pt required assistance managing the IV pole, but was able to complete stair navigation with supervision. Pt did seem to have trouble gripping the railing, particularly on the way down (rail on L)   Wheelchair Mobility    Modified Rankin (Stroke Patients Only)       Balance Overall balance assessment: Independent                                           Pertinent Vitals/Pain Pain Assessment: No/denies pain    Home Living Family/patient expects to be discharged to:: Private residence Living Arrangements: Parent Available Help at Discharge: Family Type of Home: House Home Access: Stairs to enter Entrance Stairs-Rails: Left(coming down into basement (on R from basement to main lvl)) Entrance Stairs-Number of Steps: 12(lives in basement- full flight of steep steps) Home Layout: Two level;Laundry or work area in basement;Other (Comment)(full bath in basement where pt resides) Home Equipment: None      Prior Function Level of Independence: Independent;Needs assistance(independent w/all activities except cooking/ironing (heat))  Gait / Transfers Assistance Needed: none  ADL's / Homemaking Assistance Needed: cooking        Hand Dominance   Dominant Hand: Left    Extremity/Trunk Assessment   Upper Extremity Assessment Upper Extremity Assessment: Defer to OT evaluation    Lower Extremity  Assessment Lower Extremity Assessment: Overall WFL for tasks assessed       Communication   Communication: No difficulties  Cognition Arousal/Alertness: Awake/alert Behavior During Therapy: WFL for tasks assessed/performed Overall Cognitive Status: Within Functional Limits for tasks assessed                                        General Comments General comments (skin integrity, edema, etc.): noted some skin breakdown on tip of L index finger; pt is aware and states that it happened yesterday and was bleeding    Exercises     Assessment/Plan    PT Assessment Patent does not need any further PT services  PT Problem List         PT Treatment Interventions      PT Goals (Current goals can be found in the Care Plan section)  Acute Rehab PT Goals Patient Stated Goal: to improve use of hands    Frequency     Barriers to discharge        Co-evaluation               AM-PAC PT "6 Clicks" Mobility  Outcome Measure Help needed turning from your back to your side while in a flat bed without using bedrails?: None Help needed moving from lying on your back to sitting on the side of a flat bed without using bedrails?: None Help needed moving to and from a bed to a chair (including a wheelchair)?: None Help needed standing up from a chair using your arms (e.g., wheelchair or bedside chair)?: None Help needed to walk in hospital room?: None Help needed climbing 3-5 steps with a railing? : None 6 Click Score: 24    End of Session Equipment Utilized During Treatment: Gait belt Activity Tolerance: Patient tolerated treatment well;No increased pain Patient left: in chair;with call bell/phone within reach Nurse Communication: Mobility status PT Visit Diagnosis: Other symptoms and signs involving the nervous system (R29.898);Other (comment)((lack of sensation in hands))    Time: 0903-0934 PT Time Calculation (min) (ACUTE ONLY): 31 min   Charges:   PT  Evaluation $PT Eval Low Complexity: 1 Low PT Treatments $Gait Training: 8-22 mins      Carlos [name], SPT   Carlos [name] 09/24/2019, 12:04 PM

## 2019-09-24 NOTE — Progress Notes (Signed)
   Ortho Hand Progress Note  Subjective: No acute events last night. Pain well controlled    Objective: Vital signs in last 24 hours: Temp:  [97.2 F (36.2 C)-99 F (37.2 C)] 97.5 F (36.4 C) (11/20 0337) Pulse Rate:  [75-109] 89 (11/20 0337) Resp:  [13-18] 18 (11/20 0337) BP: (108-129)/(62-101) 111/62 (11/20 0337) SpO2:  [98 %-100 %] 100 % (11/20 0337) Weight:  [84.4 kg] 84.4 kg (11/19 1502)  Intake/Output from previous day: 11/19 0701 - 11/20 0700 In: 900 [I.V.:900] Out: 15 [Blood:15] Intake/Output this shift: Total I/O In: 900 [I.V.:900] Out: 15 [Blood:15]  Recent Labs    09/23/19 1339 09/24/19 0333  HGB 13.2 12.2*   Recent Labs    09/23/19 1339 09/24/19 0333  WBC 18.1* 16.4*  RBC 4.81 4.48  HCT 41.9 37.7*  PLT 214 193   Recent Labs    09/23/19 1339  NA 138  K 4.8  CL 104  CO2 27  BUN 14  CREATININE 0.85  GLUCOSE 88  CALCIUM 8.7*   No results for input(s): LABPT, INR in the last 72 hours.  Aaox3 nad Resp nonlabored RRR LUE: shortened MF. Dressings c/d/i. Exposed digits wwp with bcr and minimal swelling. Decreased sensation throughout the fingers which is at his baseline   Assessment/Plan: Left MF distal phalanx osteomyelitis s/p I&D and revision amputation, POD1  - Medicine primary. Appreciate management - Dressing change tomorrow - Cxs. Pending - Abx per primary. Would recommend ID consult pending cx results - Elevate LUE - Remainder per primary    Avanell Shackleton III 09/24/2019, 6:58 AM  [phone]

## 2019-09-25 DIAGNOSIS — M869 Osteomyelitis, unspecified: Secondary | ICD-10-CM

## 2019-09-25 DIAGNOSIS — Z89022 Acquired absence of left finger(s): Secondary | ICD-10-CM

## 2019-09-25 NOTE — Progress Notes (Signed)
[facility]   Reason for visit: Follow up on LT finger osteomyelitis  Antibiotics: Vancomycin, day 2  Interval History: surgeon took dressing down to wound today and much improved per report. I spoke with micro lab around 1 PM as no cx report was yet available. MALDITOF ID not anticipated until late tonight and sensitivities will likely be delayed until Sunday. Pt reports he is feeling better (mentions he was an active worker's comp case in [place] where incident initially began in February 2020). Fever curve, WBC & Cr trends, imaging, cx results, and ABX usage all independently reviewed    Current Facility-Administered Medications:  .  acetaminophen (TYLENOL) tablet 650 mg, 650 mg, Oral, Q6H PRN, 650 mg at 09/23/19 2148 **OR** acetaminophen (TYLENOL) suppository 650 mg, 650 mg, Rectal, Q6H PRN, Lady Deutscher, MD .  enoxaparin (LOVENOX) injection 40 mg, 40 mg, Subcutaneous, Q24H, Evangeline Gula, Wyatt Haste, MD, 40 mg at 09/24/19 1649 .  ketorolac (TORADOL) 15 MG/ML injection 15 mg, 15 mg, Intravenous, Q6H PRN, Lady Deutscher, MD .  lactated ringers infusion, , Intravenous, Continuous, Roderic Palau, MD, Stopped at 09/25/19 [phone] .  ondansetron (ZOFRAN) tablet 4 mg, 4 mg, Oral, Q6H PRN **OR** ondansetron (ZOFRAN) injection 4 mg, 4 mg, Intravenous, Q6H PRN, Lady Deutscher, MD .  vancomycin (VANCOCIN) 1,500 mg in sodium chloride 0.9 % 500 mL IVPB, 1,500 mg, Intravenous, Q12H, Verlee Monte, MD, Stopped at 09/25/19 0521   Physical Exam:   Vitals:   09/25/19 0852 09/25/19 0854  BP: 90/71 112/60  Pulse: 73 68  Resp:    Temp: 98.5 F (36.9 C)   SpO2: 99%    Physical Exam Gen: pleasant, mild distress secondary to LT hand pain, A&Ox 3 Head: NCAT, no temporal wasting evident EENT: PERRL, EOMI, MMM, adequate dentition Neck: supple, no JVD CV: NRRR, no murmurs evident Pulm: CTA bilaterally, no wheeze or retractions Abd: soft, NTND, +BS Extrems: trace LE  edema, 2+ pulses, LT hand and LT middle finger heavily bandaged Skin: no rashes, adequate skin turgor Neuro: CN II-XII grossly intact, no focal neurologic deficits appreciated, gait was not assessed, A&Ox 3   Review of Systems:  Review of Systems  Constitutional: Negative for chills, fever and weight loss.  HENT: Negative for congestion, hearing loss, sinus pain and sore throat.   Eyes: Negative for blurred vision, photophobia and discharge.  Respiratory: Negative for cough, hemoptysis and shortness of breath.   Cardiovascular: Negative for chest pain, palpitations, orthopnea and leg swelling.  Gastrointestinal: Negative for abdominal pain, constipation, diarrhea, heartburn, nausea and vomiting.  Genitourinary: Negative for dysuria, flank pain, frequency and urgency.  Musculoskeletal: Positive for joint pain. Negative for back pain and myalgias.  Skin: Negative for itching and rash.  Neurological: Negative for tremors, seizures, weakness and headaches.  Endo/Heme/Allergies: Negative for polydipsia. Does not bruise/bleed easily.  Psychiatric/Behavioral: Negative for depression and substance abuse. The patient is not nervous/anxious and does not have insomnia.      Lab Results  Component Value Date   WBC 16.4 (H) 09/24/2019   HGB 12.2 (L) 09/24/2019   HCT 37.7 (L) 09/24/2019   MCV 84.2 09/24/2019   PLT 193 09/24/2019    Lab Results  Component Value Date   CREATININE 0.85 09/23/2019   BUN 14 09/23/2019   NA 138 09/23/2019   K 4.8 09/23/2019   CL 104 09/23/2019   CO2 27 09/23/2019   No results found for: ALT, AST, GGT, ALKPHOS   Microbiology:  Recent Results (from the past 240 hour(s))  SARS Coronavirus 2 by RT PCR (hospital order, performed in [facility] hospital lab) Nasopharyngeal Nasopharyngeal Swab     Status: None   Collection Time: 09/23/19  3:16 PM   Specimen: Nasopharyngeal Swab  Result Value Ref Range Status   SARS Coronavirus 2 NEGATIVE NEGATIVE Final    Comment:  (NOTE) If result is NEGATIVE SARS-CoV-2 target nucleic acids are NOT DETECTED. The SARS-CoV-2 RNA is generally detectable in upper and lower  respiratory specimens during the acute phase of infection. The lowest  concentration of SARS-CoV-2 viral copies this assay can detect is 250  copies / mL. A negative result does not preclude SARS-CoV-2 infection  and should not be used as the sole basis for treatment or other  patient management decisions.  A negative result may occur with  improper specimen collection / handling, submission of specimen other  than nasopharyngeal swab, presence of viral mutation(s) within the  areas targeted by this assay, and inadequate number of viral copies  (<250 copies / mL). A negative result must be combined with clinical  observations, patient history, and epidemiological information. If result is POSITIVE SARS-CoV-2 target nucleic acids are DETECTED. The SARS-CoV-2 RNA is generally detectable in upper and lower  respiratory specimens dur ing the acute phase of infection.  Positive  results are indicative of active infection with SARS-CoV-2.  Clinical  correlation with patient history and other diagnostic information is  necessary to determine patient infection status.  Positive results do  not rule out bacterial infection or co-infection with other viruses. If result is PRESUMPTIVE POSTIVE SARS-CoV-2 nucleic acids MAY BE PRESENT.   A presumptive positive result was obtained on the submitted specimen  and confirmed on repeat testing.  While 2019 novel coronavirus  (SARS-CoV-2) nucleic acids may be present in the submitted sample  additional confirmatory testing may be necessary for epidemiological  and / or clinical management purposes  to differentiate between  SARS-CoV-2 and other Sarbecovirus currently known to infect humans.  If clinically indicated additional testing with an alternate test  methodology [phone]) is advised. The SARS-CoV-2 RNA is  generally  detectable in upper and lower respiratory sp ecimens during the acute  phase of infection. The expected result is Negative. Fact Sheet for Patients:  StrictlyIdeas.no Fact Sheet for Healthcare Providers: BankingDealers.co.za This test is not yet approved or cleared by the [place] FDA and has been authorized for detection and/or diagnosis of SARS-CoV-2 by FDA under an Emergency Use Authorization (EUA).  This EUA will remain in effect (meaning this test can be used) for the duration of the COVID-19 declaration under Section 564(b)(1) of the Act, 21 U.S.C. section 360bbb-3(b)(1), unless the authorization is terminated or revoked sooner. Performed at [facility], [place] [address], [place], [place] [address]   Aerobic/Anaerobic Culture (surgical/deep wound)     Status: None (Preliminary result)   Collection Time: 09/23/19  6:40 PM   Specimen: PATH Bone resection; Tissue  Result Value Ref Range Status   Specimen Description TISSUE BONE LEFT FINGER  Final   Special Requests LEFT MIDDLE FINGER  Final   Gram Stain   Final    FEW WBC PRESENT, PREDOMINANTLY PMN ABUNDANT GRAM POSITIVE COCCI    Culture   Final    TOO YOUNG TO READ Performed at [facility], [place] [address] [place], [place] [address]    Report Status PENDING  Incomplete  Aerobic/Anaerobic Culture (surgical/deep wound)  Status: None (Preliminary result)   Collection Time: 09/23/19  6:56 PM   Specimen: Wound; Tissue  Result Value Ref Range Status   Specimen Description WOUND LEFT FINGER  Final   Special Requests SWAB OF LEFT MIDDLE FINGER TIS  Final   Gram Stain   Final    MODERATE WBC PRESENT, PREDOMINANTLY PMN ABUNDANT GRAM POSITIVE COCCI    Culture   Final    TOO YOUNG TO READ Performed at [facility], [place] [address], [place], [place] [address]    Report Status PENDING  Incomplete    Impression/Plan: The patient is a 53 year old  African-American male with a history of crush injury to both hands earlier this year in [place] presenting with concerns for left distal third finger osteomyelitis, status post partial amputation and now repeat debridement.  1.  Left third finger osteomyelitis-I spoke with the microbiology lab this morning as the patient had operative cultures sent yesterday.  Preliminarily, approximately 3 pathogen have thus far been identified on plates including Staphylococcus and group B strep but an unidentified third pathogen as well.  Abundant purulence was noted intraoperatively yesterday and incisions were extended into the palm of the patient's hand for adequate release, suggestive of a tenosynovitis as well.  I discussed the patient's care with his hospitalist and feel more appropriate action at this time would be to continue vancomycin dosed for goal trough of 15-20 and await culture results.  As he is likely to need 6 weeks of antibiotic therapy in total, he may best be suited for an oral Zyvox bridge with dalbavancin infusions postoperatively if only gram-positive pathogen are indeed isolated.  Unfortunately, there is little mechanism to set this up over the weekend, so will await final cultures and sensitivities before making a final antibiotic plan.  Tentative discharge date for the patient's antibiotics will be November 04, 2018.  Would advise checking the patient's BMP regularly while he remains on vancomycin.

## 2019-09-25 NOTE — Progress Notes (Signed)
   Ortho Hand Progress Note  Subjective: No acute events last night. Pain well controlled     Objective: Vital signs in last 24 hours: Temp:  [97.5 F (36.4 C)-98.4 F (36.9 C)] 97.5 F (36.4 C) (11/21 0418) Pulse Rate:  [63-80] 63 (11/21 0418) Resp:  [16-17] 17 (11/20 1423) BP: (106-122)/(71-73) 106/73 (11/21 0418) SpO2:  [96 %-100 %] 100 % (11/21 0418)  Intake/Output from previous day: 11/20 0701 - 11/21 0700 In: 650.2 [P.O.:480; I.V.:170.2] Out: -  Intake/Output this shift: No intake/output data recorded.  Recent Labs    09/23/19 1339 09/24/19 0333  HGB 13.2 12.2*   Recent Labs    09/23/19 1339 09/24/19 0333  WBC 18.1* 16.4*  RBC 4.81 4.48  HCT 41.9 37.7*  PLT 214 193   Recent Labs    09/23/19 1339  NA 138  K 4.8  CL 104  CO2 27  BUN 14  CREATININE 0.85  GLUCOSE 88  CALCIUM 8.7*   No results for input(s): LABPT, INR in the last 72 hours.  Aaox3 nad Resp nonlabored RRR LUE: shortened MF. Dressings changed. Incision intact with some mild serous drainage. Moderate swelling to the digit. Erythema along the volar MF. Exposed digits wwp with bcr and minimal swelling. Decreased sensation throughout the fingers which is at his baseline   Assessment/Plan: Left MF distal phalanx osteomyelitis s/p I&D and revision amputation, POD2  - Medicine primary. Appreciate management - Dry dressing changes daily by nursine - Cxs. Pending - Abx per primary. Appreciate ID input - Elevate LUE - Remainder per primary    Avanell Shackleton III 09/25/2019, 7:21 AM  [phone]

## 2019-09-25 NOTE — Plan of Care (Signed)

## 2019-09-25 NOTE — Plan of Care (Signed)
  Problem: Education: Goal: Knowledge of General Education information will improve Description: Including pain rating scale, medication(s)/side effects and non-pharmacologic comfort measures Outcome: Progressing   Problem: Clinical Measurements: Goal: Ability to maintain clinical measurements within normal limits will improve Outcome: Progressing   

## 2019-09-25 NOTE — Progress Notes (Signed)
[facility] PROGRESS NOTE  Carlos [name] [id number] DOB: May 17, 1966 DOA: 09/23/2019 PCP: Patient, No Pcp Per   Subjective  doin fair no n/v cp fever Pain controlled   Brief history  Carlos [name] is a 53 y.o. male with medical history significant of crush injury to the bilateral hands in February 2020 it resulted in several broken fingers and decrease of sensation in his hands.  Unfortunately his left long finger developed an osteomyelitis and he was treated conservatively but that did not resolve.  He required a partial amputation of his finger and seemed to have resolved.  He was in his usual state of health until 1 week ago when he noted drainage from his left long finger.  This morning he awoke with redness and swelling of the finger without any lymphangitis.  He has had no fevers.  His lack of sensation probably contributed to the late presentation.  X-rays showed osteomyelitis of the distal phalanx of the left long finger.  Hand surgery has been consulted and has seen the patient.  They asked that we admit the patient for further evaluation and management.  Assessment and plan   Principal Problem:   Finger osteomyelitis, left (HCC) Active Problems:   Leukocytosis   Left third finger acute osteomyelitis Status post partial amputation done by Dr. Charlane Ferretti on 09/23/2019. Prior osteo s/p surgery Cultures from wounds still pending-await cult and then ID will comment on further Abx regimen  Leukocytosis This is secondary to the acute osteomyelitis  Code Status: Full Code Family Communication: Plan discussed with the patient. Disposition Plan: Remains inpatient Diet:  Diet Order            Diet regular Room service appropriate? Yes; Fluid consistency: Thin  Diet effective now              Consultants   Ortho  ID  Procedures  . Partial amputation of the left long finger done by Dr. Jeannie Fend on 1119.  Antibiotics   Clindamycin  Objective   Vitals:    09/25/19 0852 09/25/19 0854  BP: 90/71 112/60  Pulse: 73 68  Resp:    Temp: 98.5 F (36.9 C)   SpO2: 99%     Intake/Output Summary (Last 24 hours) at 09/25/2019 1526 Last data filed at 09/25/2019 0900 Gross per 24 hour  Intake 928.75 ml  Output -  Net 928.75 ml   Filed Weights   09/23/19 1400 09/23/19 1502  Weight: 84.4 kg 84.4 kg    Physical examination   Awake coherent in nad No ict no palor perrla s1 s 2no m Cta b abd soft Wound on L hand not examined   Reviewed data  Basic Metabolic Panel: Recent Labs  Lab 09/23/19 1339  NA 138  K 4.8  CL 104  CO2 27  GLUCOSE 88  BUN 14  CREATININE 0.85  CALCIUM 8.7*   Liver Function Tests: No results for input(s): AST, ALT, ALKPHOS, BILITOT, PROT, ALBUMIN in the last 168 hours. No results for input(s): LIPASE, AMYLASE in the last 168 hours. No results for input(s): AMMONIA in the last 168 hours. CBC: Recent Labs  Lab 09/23/19 1339 09/24/19 0333  WBC 18.1* 16.4*  NEUTROABS 14.5*  --   HGB 13.2 12.2*  HCT 41.9 37.7*  MCV 87.1 84.2  PLT 214 193   Cardiac Enzymes: No results for input(s): CKTOTAL, CKMB, CKMBINDEX, TROPONINI in the last 168 hours. BNP (last 3 results) No results for input(s): BNP in the last 8760 hours.  ProBNP (last 3 results) No results for input(s): PROBNP in the last 8760 hours.  CBG: No results for input(s): GLUCAP in the last 168 hours.  Micro Recent Results (from the past 240 hour(s))  SARS Coronavirus 2 by RT PCR (hospital order, performed in [facility] hospital lab) Nasopharyngeal Nasopharyngeal Swab     Status: None   Collection Time: 09/23/19  3:16 PM   Specimen: Nasopharyngeal Swab  Result Value Ref Range Status   SARS Coronavirus 2 NEGATIVE NEGATIVE Final    Comment: (NOTE) If result is NEGATIVE SARS-CoV-2 target nucleic acids are NOT DETECTED. The SARS-CoV-2 RNA is generally detectable in upper and lower  respiratory specimens during the acute phase of infection. The  lowest  concentration of SARS-CoV-2 viral copies this assay can detect is 250  copies / mL. A negative result does not preclude SARS-CoV-2 infection  and should not be used as the sole basis for treatment or other  patient management decisions.  A negative result may occur with  improper specimen collection / handling, submission of specimen other  than nasopharyngeal swab, presence of viral mutation(s) within the  areas targeted by this assay, and inadequate number of viral copies  (<250 copies / mL). A negative result must be combined with clinical  observations, patient history, and epidemiological information. If result is POSITIVE SARS-CoV-2 target nucleic acids are DETECTED. The SARS-CoV-2 RNA is generally detectable in upper and lower  respiratory specimens dur ing the acute phase of infection.  Positive  results are indicative of active infection with SARS-CoV-2.  Clinical  correlation with patient history and other diagnostic information is  necessary to determine patient infection status.  Positive results do  not rule out bacterial infection or co-infection with other viruses. If result is PRESUMPTIVE POSTIVE SARS-CoV-2 nucleic acids MAY BE PRESENT.   A presumptive positive result was obtained on the submitted specimen  and confirmed on repeat testing.  While 2019 novel coronavirus  (SARS-CoV-2) nucleic acids may be present in the submitted sample  additional confirmatory testing may be necessary for epidemiological  and / or clinical management purposes  to differentiate between  SARS-CoV-2 and other Sarbecovirus currently known to infect humans.  If clinically indicated additional testing with an alternate test  methodology [phone]) is advised. The SARS-CoV-2 RNA is generally  detectable in upper and lower respiratory sp ecimens during the acute  phase of infection. The expected result is Negative. Fact Sheet for Patients:  StrictlyIdeas.no  Fact Sheet for Healthcare Providers: BankingDealers.co.za This test is not yet approved or cleared by the [place] FDA and has been authorized for detection and/or diagnosis of SARS-CoV-2 by FDA under an Emergency Use Authorization (EUA).  This EUA will remain in effect (meaning this test can be used) for the duration of the COVID-19 declaration under Section 564(b)(1) of the Act, 21 U.S.C. section 360bbb-3(b)(1), unless the authorization is terminated or revoked sooner. Performed at [facility], [place] [address], [place], [place] [address]   Aerobic/Anaerobic Culture (surgical/deep wound)     Status: None (Preliminary result)   Collection Time: 09/23/19  6:40 PM   Specimen: PATH Bone resection; Tissue  Result Value Ref Range Status   Specimen Description TISSUE BONE LEFT FINGER  Final   Special Requests LEFT MIDDLE FINGER  Final   Gram Stain   Final    FEW WBC PRESENT, PREDOMINANTLY PMN ABUNDANT GRAM POSITIVE COCCI    Culture   Final    TOO YOUNG TO READ Performed at  [facility], [place] [address], [place], [place] [address]    Report Status PENDING  Incomplete  Aerobic/Anaerobic Culture (surgical/deep wound)     Status: None (Preliminary result)   Collection Time: 09/23/19  6:56 PM   Specimen: Wound; Tissue  Result Value Ref Range Status   Specimen Description WOUND LEFT FINGER  Final   Special Requests SWAB OF LEFT MIDDLE FINGER TIS  Final   Gram Stain   Final    MODERATE WBC PRESENT, PREDOMINANTLY PMN ABUNDANT GRAM POSITIVE COCCI    Culture   Final    TOO YOUNG TO READ Performed at [facility], [place] [address], [place], [place] [address]    Report Status PENDING  Incomplete     Radiological studies, reviewed  No results found.  Scheduled Meds: . enoxaparin (LOVENOX) injection  40 mg Subcutaneous Q24H   Continuous Infusions: . lactated ringers Stopped (09/25/19 0712)  . vancomycin Stopped (09/25/19 0521)     Time  spent: 15 minutes Verneita Griffes, MD [facility] 3:28 PM  09/25/2019, 3:26 PM  LOS: 2 days

## 2019-09-25 NOTE — Progress Notes (Signed)
Pt stable with no c/o pain to the left hand. Will continue to monitor.

## 2019-09-26 LAB — CBC WITH DIFFERENTIAL/PLATELET
Abs Immature Granulocytes: 0.03 10*3/uL (ref 0.00–0.07)
Basophils Absolute: 0.1 10*3/uL (ref 0.0–0.1)
Basophils Relative: 1 %
Eosinophils Absolute: 0.2 10*3/uL (ref 0.0–0.5)
Eosinophils Relative: 3 %
HCT: 37 % — ABNORMAL LOW (ref 39.0–52.0)
Hemoglobin: 11.8 g/dL — ABNORMAL LOW (ref 13.0–17.0)
Immature Granulocytes: 1 %
Lymphocytes Relative: 22 %
Lymphs Abs: 1.3 10*3/uL (ref 0.7–4.0)
MCH: 27.1 pg (ref 26.0–34.0)
MCHC: 31.9 g/dL (ref 30.0–36.0)
MCV: 85.1 fL (ref 80.0–100.0)
Monocytes Absolute: 0.7 10*3/uL (ref 0.1–1.0)
Monocytes Relative: 11 %
Neutro Abs: 3.6 10*3/uL (ref 1.7–7.7)
Neutrophils Relative %: 62 %
Platelets: 232 10*3/uL (ref 150–400)
RBC: 4.35 MIL/uL (ref 4.22–5.81)
RDW: 12.9 % (ref 11.5–15.5)
WBC: 5.8 10*3/uL (ref 4.0–10.5)
nRBC: 0 % (ref 0.0–0.2)

## 2019-09-26 LAB — BASIC METABOLIC PANEL
Anion gap: 8 (ref 5–15)
BUN: 13 mg/dL (ref 6–20)
CO2: 21 mmol/L — ABNORMAL LOW (ref 22–32)
Calcium: 8.5 mg/dL — ABNORMAL LOW (ref 8.9–10.3)
Chloride: 110 mmol/L (ref 98–111)
Creatinine, Ser: 0.68 mg/dL (ref 0.61–1.24)
GFR calc Af Amer: >60 mL/min (ref >60)
GFR calc non Af Amer: >60 mL/min (ref >60)
Glucose, Bld: 89 mg/dL (ref 70–99)
Potassium: 4 mmol/L (ref 3.5–5.1)
Sodium: 139 mmol/L (ref 135–145)

## 2019-09-26 MED ORDER — SODIUM CHLORIDE 0.9 % IV SOLN
2.0000 g | INTRAVENOUS | Status: DC
  Administered 2019-09-26: 2 g via INTRAVENOUS
  Filled 2019-09-26: qty 20

## 2019-09-26 NOTE — Progress Notes (Signed)
[facility] PROGRESS NOTE  Carlos [name] [id number] DOB: 31-May-1966 DOA: 09/23/2019 PCP: Patient, No Pcp Per   Subjective   doin fair  No issues overnight Dressings changed yesterday   Brief history  Carlos [name] is a 53 y.o. male with medical history significant of crush injury to the bilateral hands in February 2020 it resulted in several broken fingers and decrease of sensation in his hands.  Unfortunately his left long finger developed an osteomyelitis and he was treated conservatively but that did not resolve.  He required a partial amputation of his finger and seemed to have resolved.  He was in his usual state of health until 1 week ago when he noted drainage from his left long finger.  This morning he awoke with redness and swelling of the finger without any lymphangitis.  He has had no fevers.  His lack of sensation probably contributed to the late presentation.  X-rays showed osteomyelitis of the distal phalanx of the left long finger.  Hand surgery has been consulted and has seen the patient.  They asked that we admit the patient for further evaluation and management.  Assessment and plan   Principal Problem:   Finger osteomyelitis, left (HCC) Active Problems:   Leukocytosis   Left third finger acute osteomyelitis Status post partial amputation done by Dr. Charlane Ferretti on 09/23/2019. Prior osteo s/p surgery Cultures from wounds growing Staphylococcus although unclear sensitivities and will defer to infectious disease regarding possible need for PICC line placement and ceftriaxone versus possibility of using Zyvox p.o.-I have spoken with Dr. Prince Rome who will follow up  Leukocytosis This is secondary to the acute osteomyelitis  Code Status: Full Code Family Communication: Plan discussed with the patient. Disposition Plan: Remains inpatient Diet:  Diet Order            Diet regular Room service appropriate? Yes; Fluid consistency: Thin  Diet effective now              Consultants   Ortho  ID  Procedures  . Partial amputation of the left long finger done by Dr. Jeannie Fend on 1119.  Antibiotics   Clindamycin  Objective   Vitals:   09/26/19 0349 09/26/19 0748  BP: 119/88 106/81  Pulse: (!) 55 (!) 59  Resp:  19  Temp: 97.7 F (36.5 C) 97.8 F (36.6 C)  SpO2: 99% 98%    Intake/Output Summary (Last 24 hours) at 09/26/2019 0859 Last data filed at 09/25/2019 1700 Gross per 24 hour  Intake 856.05 ml  Output -  Net 856.05 ml   Filed Weights   09/23/19 1400 09/23/19 1502  Weight: 84.4 kg 84.4 kg    Physical examination   Coherent pleasant no distress EOMI NCAT S1-S2 no murmur Abdomen soft Wound not examined   Reviewed data  Basic Metabolic Panel: Recent Labs  Lab 09/23/19 1339  NA 138  K 4.8  CL 104  CO2 27  GLUCOSE 88  BUN 14  CREATININE 0.85  CALCIUM 8.7*   Liver Function Tests: No results for input(s): AST, ALT, ALKPHOS, BILITOT, PROT, ALBUMIN in the last 168 hours. No results for input(s): LIPASE, AMYLASE in the last 168 hours. No results for input(s): AMMONIA in the last 168 hours. CBC: Recent Labs  Lab 09/23/19 1339 09/24/19 0333  WBC 18.1* 16.4*  NEUTROABS 14.5*  --   HGB 13.2 12.2*  HCT 41.9 37.7*  MCV 87.1 84.2  PLT 214 193   Cardiac Enzymes: No results for input(s): CKTOTAL, CKMB, CKMBINDEX, TROPONINI  in the last 168 hours. BNP (last 3 results) No results for input(s): BNP in the last 8760 hours.  ProBNP (last 3 results) No results for input(s): PROBNP in the last 8760 hours.  CBG: No results for input(s): GLUCAP in the last 168 hours.  Micro Recent Results (from the past 240 hour(s))  SARS Coronavirus 2 by RT PCR (hospital order, performed in [facility] hospital lab) Nasopharyngeal Nasopharyngeal Swab     Status: None   Collection Time: 09/23/19  3:16 PM   Specimen: Nasopharyngeal Swab  Result Value Ref Range Status   SARS Coronavirus 2 NEGATIVE NEGATIVE Final    Comment: (NOTE)  If result is NEGATIVE SARS-CoV-2 target nucleic acids are NOT DETECTED. The SARS-CoV-2 RNA is generally detectable in upper and lower  respiratory specimens during the acute phase of infection. The lowest  concentration of SARS-CoV-2 viral copies this assay can detect is 250  copies / mL. A negative result does not preclude SARS-CoV-2 infection  and should not be used as the sole basis for treatment or other  patient management decisions.  A negative result may occur with  improper specimen collection / handling, submission of specimen other  than nasopharyngeal swab, presence of viral mutation(s) within the  areas targeted by this assay, and inadequate number of viral copies  (<250 copies / mL). A negative result must be combined with clinical  observations, patient history, and epidemiological information. If result is POSITIVE SARS-CoV-2 target nucleic acids are DETECTED. The SARS-CoV-2 RNA is generally detectable in upper and lower  respiratory specimens dur ing the acute phase of infection.  Positive  results are indicative of active infection with SARS-CoV-2.  Clinical  correlation with patient history and other diagnostic information is  necessary to determine patient infection status.  Positive results do  not rule out bacterial infection or co-infection with other viruses. If result is PRESUMPTIVE POSTIVE SARS-CoV-2 nucleic acids MAY BE PRESENT.   A presumptive positive result was obtained on the submitted specimen  and confirmed on repeat testing.  While 2019 novel coronavirus  (SARS-CoV-2) nucleic acids may be present in the submitted sample  additional confirmatory testing may be necessary for epidemiological  and / or clinical management purposes  to differentiate between  SARS-CoV-2 and other Sarbecovirus currently known to infect humans.  If clinically indicated additional testing with an alternate test  methodology [phone]) is advised. The SARS-CoV-2 RNA is generally   detectable in upper and lower respiratory sp ecimens during the acute  phase of infection. The expected result is Negative. Fact Sheet for Patients:  StrictlyIdeas.no Fact Sheet for Healthcare Providers: BankingDealers.co.za This test is not yet approved or cleared by the [place] FDA and has been authorized for detection and/or diagnosis of SARS-CoV-2 by FDA under an Emergency Use Authorization (EUA).  This EUA will remain in effect (meaning this test can be used) for the duration of the COVID-19 declaration under Section 564(b)(1) of the Act, 21 U.S.C. section 360bbb-3(b)(1), unless the authorization is terminated or revoked sooner. Performed at [facility], [place] [address], [place], [place] [address]   Aerobic/Anaerobic Culture (surgical/deep wound)     Status: None (Preliminary result)   Collection Time: 09/23/19  6:40 PM   Specimen: PATH Bone resection; Tissue  Result Value Ref Range Status   Specimen Description TISSUE BONE LEFT FINGER  Final   Special Requests LEFT MIDDLE FINGER  Final   Gram Stain   Final    FEW WBC PRESENT, PREDOMINANTLY PMN  ABUNDANT GRAM POSITIVE COCCI Performed at [facility], [place] [address], [place], [place] [address]    Culture   Final    ABUNDANT STAPHYLOCOCCUS AUREUS MODERATE GROUP B STREP(S.AGALACTIAE)ISOLATED TESTING AGAINST S. AGALACTIAE NOT ROUTINELY PERFORMED DUE TO PREDICTABILITY OF AMP/PEN/VAN SUSCEPTIBILITY. NO ANAEROBES ISOLATED; CULTURE IN PROGRESS FOR 5 DAYS    Report Status PENDING  Incomplete  Aerobic/Anaerobic Culture (surgical/deep wound)     Status: None (Preliminary result)   Collection Time: 09/23/19  6:56 PM   Specimen: Wound; Tissue  Result Value Ref Range Status   Specimen Description WOUND LEFT FINGER  Final   Special Requests SWAB OF LEFT MIDDLE FINGER TIS  Final   Gram Stain   Final    MODERATE WBC PRESENT, PREDOMINANTLY PMN ABUNDANT GRAM POSITIVE COCCI  Performed at [facility], [place] [address], [place], [place] [address]    Culture   Final    MODERATE STAPHYLOCOCCUS AUREUS MODERATE GROUP B STREP(S.AGALACTIAE)ISOLATED TESTING AGAINST S. AGALACTIAE NOT ROUTINELY PERFORMED DUE TO PREDICTABILITY OF AMP/PEN/VAN SUSCEPTIBILITY. NO ANAEROBES ISOLATED; CULTURE IN PROGRESS FOR 5 DAYS    Report Status PENDING  Incomplete     Radiological studies, reviewed  No results found.  Scheduled Meds: . enoxaparin (LOVENOX) injection  40 mg Subcutaneous Q24H   Continuous Infusions: . lactated ringers Stopped (09/25/19 1624)  . vancomycin 1,500 mg (09/26/19 0452)     Time spent: 15 minutes Verneita Griffes, MD [facility] 8:59 AM  09/26/2019, 8:59 AM  LOS: 3 days

## 2019-09-26 NOTE — Progress Notes (Signed)
[facility] for Infectious Disease   Reason for visit: Follow up on LT 3rd finger osteomyelitis/tenosynovitis  Interval History: I again spoke with the patient's hospitalist early this morning regarding his culture results.  The micro lab is reporting MSSA and group B strep. Appetite has been good today and he is slowly getting better ROM to the fingers on his LT hand. Ambulating to restroom and back without difficulty thus far. Fever curve, WBC & Cr trends, imaging, cx results, and ABX usage all independently reviewed.     Current Facility-Administered Medications:  .  acetaminophen (TYLENOL) tablet 650 mg, 650 mg, Oral, Q6H PRN, 650 mg at 09/23/19 2148 **OR** acetaminophen (TYLENOL) suppository 650 mg, 650 mg, Rectal, Q6H PRN, Lady Deutscher, MD .  enoxaparin (LOVENOX) injection 40 mg, 40 mg, Subcutaneous, Q24H, Evangeline Gula, Wyatt Haste, MD, 40 mg at 09/25/19 1625 .  ketorolac (TORADOL) 15 MG/ML injection 15 mg, 15 mg, Intravenous, Q6H PRN, Lady Deutscher, MD .  lactated ringers infusion, , Intravenous, Continuous, Roderic Palau, MD, Stopped at 09/25/19 1624 .  ondansetron (ZOFRAN) tablet 4 mg, 4 mg, Oral, Q6H PRN **OR** ondansetron (ZOFRAN) injection 4 mg, 4 mg, Intravenous, Q6H PRN, Lady Deutscher, MD .  vancomycin (VANCOCIN) 1,500 mg in sodium chloride 0.9 % 500 mL IVPB, 1,500 mg, Intravenous, Q12H, Verlee Monte, MD, Stopped at 09/26/19 0700   Physical Exam:   Vitals:   09/26/19 0748 09/26/19 1434  BP: 106/81 113/79  Pulse: (!) 59 (!) 57  Resp: 19 20  Temp: 97.8 F (36.6 C) 98.2 F (36.8 C)  SpO2: 98% 98%   Physical Exam Gen: pleasant, NAD, A&Ox 3 Head: NCAT, no temporal wasting evident EENT: PERRL, EOMI, MMM, adequate dentition Neck: supple, no JVD CV: NRRR, no murmurs evident Pulm: CTA bilaterally, no wheeze or retractions Abd: soft, NTND, +BS Extrems: no LE edema, 2+ pulses, LT hand and LT middle finger bandage c/d/i, improving ROM to fingers on LT  hand Skin: no rashes, adequate skin turgor Neuro: CN II-XII grossly intact, no focal neurologic deficits appreciated, gait was not assessed, A&Ox 3   Review of Systems:  Review of Systems  Constitutional: Negative for chills, fever and weight loss.  HENT: Negative for congestion, hearing loss, sinus pain and sore throat.   Eyes: Negative for blurred vision, photophobia and discharge.  Respiratory: Negative for cough, hemoptysis and shortness of breath.   Cardiovascular: Negative for chest pain, palpitations, orthopnea and leg swelling.  Gastrointestinal: Negative for abdominal pain, constipation, diarrhea, heartburn, nausea and vomiting.  Genitourinary: Negative for dysuria, flank pain, frequency and urgency.  Musculoskeletal: Positive for joint pain. Negative for back pain and myalgias.  Skin: Negative for itching and rash.  Neurological: Positive for focal weakness. Negative for tremors, seizures, weakness and headaches.       LT hand  Endo/Heme/Allergies: Negative for polydipsia. Does not bruise/bleed easily.  Psychiatric/Behavioral: Negative for depression and substance abuse. The patient is nervous/anxious. The patient does not have insomnia.      Lab Results  Component Value Date   WBC 5.8 09/26/2019   HGB 11.8 (L) 09/26/2019   HCT 37.0 (L) 09/26/2019   MCV 85.1 09/26/2019   PLT 232 09/26/2019    Lab Results  Component Value Date   CREATININE 0.68 09/26/2019   BUN 13 09/26/2019   NA 139 09/26/2019   K 4.0 09/26/2019   CL 110 09/26/2019   CO2 21 (L) 09/26/2019   No results found for: ALT, AST, GGT, ALKPHOS  Microbiology: Recent Results (from the past 240 hour(s))  SARS Coronavirus 2 by RT PCR (hospital order, performed in [facility] hospital lab) Nasopharyngeal Nasopharyngeal Swab     Status: None   Collection Time: 09/23/19  3:16 PM   Specimen: Nasopharyngeal Swab  Result Value Ref Range Status   SARS Coronavirus 2 NEGATIVE NEGATIVE Final    Comment: (NOTE) If  result is NEGATIVE SARS-CoV-2 target nucleic acids are NOT DETECTED. The SARS-CoV-2 RNA is generally detectable in upper and lower  respiratory specimens during the acute phase of infection. The lowest  concentration of SARS-CoV-2 viral copies this assay can detect is 250  copies / mL. A negative result does not preclude SARS-CoV-2 infection  and should not be used as the sole basis for treatment or other  patient management decisions.  A negative result may occur with  improper specimen collection / handling, submission of specimen other  than nasopharyngeal swab, presence of viral mutation(s) within the  areas targeted by this assay, and inadequate number of viral copies  (<250 copies / mL). A negative result must be combined with clinical  observations, patient history, and epidemiological information. If result is POSITIVE SARS-CoV-2 target nucleic acids are DETECTED. The SARS-CoV-2 RNA is generally detectable in upper and lower  respiratory specimens dur ing the acute phase of infection.  Positive  results are indicative of active infection with SARS-CoV-2.  Clinical  correlation with patient history and other diagnostic information is  necessary to determine patient infection status.  Positive results do  not rule out bacterial infection or co-infection with other viruses. If result is PRESUMPTIVE POSTIVE SARS-CoV-2 nucleic acids MAY BE PRESENT.   A presumptive positive result was obtained on the submitted specimen  and confirmed on repeat testing.  While 2019 novel coronavirus  (SARS-CoV-2) nucleic acids may be present in the submitted sample  additional confirmatory testing may be necessary for epidemiological  and / or clinical management purposes  to differentiate between  SARS-CoV-2 and other Sarbecovirus currently known to infect humans.  If clinically indicated additional testing with an alternate test  methodology [phone]) is advised. The SARS-CoV-2 RNA is generally   detectable in upper and lower respiratory sp ecimens during the acute  phase of infection. The expected result is Negative. Fact Sheet for Patients:  StrictlyIdeas.no Fact Sheet for Healthcare Providers: BankingDealers.co.za This test is not yet approved or cleared by the [place] FDA and has been authorized for detection and/or diagnosis of SARS-CoV-2 by FDA under an Emergency Use Authorization (EUA).  This EUA will remain in effect (meaning this test can be used) for the duration of the COVID-19 declaration under Section 564(b)(1) of the Act, 21 U.S.C. section 360bbb-3(b)(1), unless the authorization is terminated or revoked sooner. Performed at [facility], [place] [address] [place], [place] [address]   Aerobic/Anaerobic Culture (surgical/deep wound)     Status: None (Preliminary result)   Collection Time: 09/23/19  6:40 PM   Specimen: PATH Bone resection; Tissue  Result Value Ref Range Status   Specimen Description TISSUE BONE LEFT FINGER  Final   Special Requests LEFT MIDDLE FINGER  Final   Gram Stain   Final    FEW WBC PRESENT, PREDOMINANTLY PMN ABUNDANT GRAM POSITIVE COCCI Performed at [facility], [place] [address] [place], [place] [address]    Culture   Final    ABUNDANT STAPHYLOCOCCUS AUREUS MODERATE GROUP B STREP(S.AGALACTIAE)ISOLATED TESTING AGAINST S. AGALACTIAE NOT ROUTINELY PERFORMED DUE TO PREDICTABILITY OF AMP/PEN/VAN SUSCEPTIBILITY.  NO ANAEROBES ISOLATED; CULTURE IN PROGRESS FOR 5 DAYS    Report Status PENDING  Incomplete  Aerobic/Anaerobic Culture (surgical/deep wound)     Status: None (Preliminary result)   Collection Time: 09/23/19  6:56 PM   Specimen: Wound; Tissue  Result Value Ref Range Status   Specimen Description WOUND LEFT FINGER  Final   Special Requests SWAB OF LEFT MIDDLE FINGER TIS  Final   Gram Stain   Final    MODERATE WBC PRESENT, PREDOMINANTLY PMN ABUNDANT GRAM POSITIVE COCCI  Performed at [facility], [place] [address] [place], [place] [address]    Culture   Final    MODERATE STAPHYLOCOCCUS AUREUS MODERATE STREPTOCOCCUS AGALACTIAE TESTING AGAINST S. AGALACTIAE NOT ROUTINELY PERFORMED DUE TO PREDICTABILITY OF AMP/PEN/VAN SUSCEPTIBILITY. NO ANAEROBES ISOLATED; CULTURE IN PROGRESS FOR 5 DAYS    Report Status PENDING  Incomplete   Organism ID, Bacteria STAPHYLOCOCCUS AUREUS  Final      Susceptibility   Staphylococcus aureus - MIC*    CIPROFLOXACIN >=8 RESISTANT Resistant     ERYTHROMYCIN <=0.25 SENSITIVE Sensitive     GENTAMICIN <=0.5 SENSITIVE Sensitive     OXACILLIN 0.5 SENSITIVE Sensitive     TETRACYCLINE >=16 RESISTANT Resistant     VANCOMYCIN 1 SENSITIVE Sensitive     TRIMETH/SULFA <=10 SENSITIVE Sensitive     CLINDAMYCIN <=0.25 SENSITIVE Sensitive     RIFAMPIN <=0.5 SENSITIVE Sensitive     Inducible Clindamycin NEGATIVE Sensitive     * MODERATE STAPHYLOCOCCUS AUREUS    Impression/Plan: Patient is a 53 year old African-American male with history of crush injury to both hands in February 2020 in [place] now presenting with residual left third finger osteomyelitis and more proximal tenosynovitis, status post partial amputation and debridement.  1. LT 3rd finger osteomyelitis/tenosynovitis -operative cultures have confirmed MSSA and group B streptococcus.  We will transition the patient's vancomycin to Rocephin 2 g IV daily for the present time.  Tentative discharge date for the patient's antibiotics will be November 04, 2018.  His leukocytosis has responded significantly following debridement and empiric antibiotics.  Given the patient's prior poor response with oral antibiotics for his initial infection, he favors parenteral treatment on this occasion.  He states that he does have personal transportation.  Medication options including daily Rocephin versus twice weekly dalbavancin were both offered to the patient.  Given the higher anticipated cure rate  with the pathogen isolated, he has elected to have a PICC line placed tomorrow and proceed with daily IV Rocephin as an outpatient (via [facility]).  As case management can best facilitate this during the weekdays, would anticipate discharge tomorrow once his antibiotics and PICC line have been set up.  We will check a CRP to establish inflammatory baseline and plan to repeat every 1 to 2 weeks while treatment is ongoing.

## 2019-09-26 NOTE — Progress Notes (Signed)
Pt wound dressing changed @ 0500 as ordered, will continue to monitor.

## 2019-09-27 ENCOUNTER — Inpatient Hospital Stay: Payer: Self-pay

## 2019-09-27 DIAGNOSIS — B9561 Methicillin susceptible Staphylococcus aureus infection as the cause of diseases classified elsewhere: Secondary | ICD-10-CM

## 2019-09-27 DIAGNOSIS — M659 Synovitis and tenosynovitis, unspecified: Secondary | ICD-10-CM

## 2019-09-27 DIAGNOSIS — B951 Streptococcus, group B, as the cause of diseases classified elsewhere: Secondary | ICD-10-CM

## 2019-09-27 DIAGNOSIS — F419 Anxiety disorder, unspecified: Secondary | ICD-10-CM

## 2019-09-27 LAB — C-REACTIVE PROTEIN: CRP: 1.3 mg/dL — ABNORMAL HIGH (ref <1.0)

## 2019-09-27 LAB — SEDIMENTATION RATE: Sed Rate: 23 mm/hr — ABNORMAL HIGH (ref 0–16)

## 2019-09-27 MED ORDER — ORITAVANCIN DIPHOSPHATE 400 MG IV SOLR
1200.0000 mg | Freq: Once | INTRAVENOUS | Status: AC
  Administered 2019-09-27: 1200 mg via INTRAVENOUS
  Filled 2019-09-27: qty 120

## 2019-09-27 MED ORDER — SODIUM CHLORIDE 0.9% FLUSH: 10.0000 mL | INTRAVENOUS | Status: DC | PRN

## 2019-09-27 MED ORDER — ACETAMINOPHEN 325 MG PO TABS: 650.0000 mg | ORAL_TABLET | Freq: Four times a day (QID) | ORAL | Status: AC | PRN

## 2019-09-27 MED ORDER — CEFTRIAXONE IV (FOR PTA / DISCHARGE USE ONLY): 2.0000 g | INTRAVENOUS | 0 refills | Status: DC

## 2019-09-27 MED ORDER — CHLORHEXIDINE GLUCONATE CLOTH 2 % EX PADS
6.0000 | MEDICATED_PAD | Freq: Every day | CUTANEOUS | Status: DC
  Administered 2019-09-27: 6 via TOPICAL

## 2019-09-27 NOTE — Progress Notes (Signed)
PHARMACY CONSULT NOTE FOR:  OUTPATIENT  PARENTERAL ANTIBIOTIC THERAPY (OPAT)  Indication: osteomyelitis  Regimen: ceftriaxone 2gm IV Q24H End date: 11/05/19  IV antibiotic discharge orders are pended. To discharging provider:  please sign these orders via discharge navigator,  Select New Orders & click on the button choice - Manage This Unsigned Work.     Thank you for allowing pharmacy to be a part of this patient's care.  Dustee Bottenfield, Rande Lawman 09/27/2019, 11:01 AM
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
## 2019-09-27 NOTE — Progress Notes (Signed)
Instructed patient on procedure. HOB less than 45*. Held pressure to post PICC site. Applied pressure drsg.to site. Instructed pt. to remain in bed for 30 min and to keep drsg CDI for 24 hours. If bleeding noted instructed to apply pressure to site and report to nurse. Patient VU. Fran Lowes, RN VAST

## 2019-09-27 NOTE — Progress Notes (Signed)
    [facility] for Infectious Disease    Date of Admission:  09/23/2019   Total days of antibiotics 4           ID: Carlos [name] is a 53 y.o. male with mssa/gbs infection- finger osteomyelitis Principal Problem:   Finger osteomyelitis, left ([facility]) Active Problems:   Leukocytosis    Subjective: Remains afebrile  Medications:  . Chlorhexidine Gluconate Cloth  6 each Topical Daily  . enoxaparin (LOVENOX) injection  40 mg Subcutaneous Q24H    Objective: Vital signs in last 24 hours: Temp:  [97.7 F (36.5 C)-98.6 F (37 C)] 97.7 F (36.5 C) (11/23 0330) Pulse Rate:  [57-71] 60 (11/23 0330) Resp:  [20] 20 (11/22 1434) BP: (100-127)/(65-79) 100/71 (11/23 0330) SpO2:  [97 %-100 %] 100 % (11/23 0330) Physical Exam  Constitutional: He is oriented to person, place, and time. He appears well-developed and well-nourished. No distress.  HENT:  Mouth/Throat: Oropharynx is clear and moist. No oropharyngeal exudate.  Cardiovascular: Normal rate, regular rhythm and normal heart sounds. Exam reveals no gallop and no friction rub.  No murmur heard.  Pulmonary/Chest: Effort normal and breath sounds normal. No respiratory distress. He has no wheezes.  [id number] hand wrapped Skin: Skin is warm and dry. No rash noted. No erythema.  Psychiatric: He has a normal mood and affect. His behavior is normal.     Lab Results Recent Labs    09/26/19 0916  WBC 5.8  HGB 11.8*  HCT 37.0*  NA 139  K 4.0  CL 110  CO2 21*  BUN 13  CREATININE 0.68   No results found for: ESRSEDRATE, POCTSEDRATE C-Reactive Protein Recent Labs    09/27/19 0430  CRP 1.3*    Microbiology: 11/19- MSSA/GBS Studies/Results: [place] Ekg Site Rite  Result Date: 09/27/2019 If Site Rite image not attached, placement could not be confirmed due to current cardiac rhythm.    Assessment/Plan: Polymicrobial MSSA/GBS osteomyelitis = will give oritavancin today and then will schedule for additional doses as  outpatient. Plan to treat for 6wk. We will see back in the ID clinic for follow up.  [facility] Cell: [phone] Pager: [phone]  09/27/2019, 12:46 PM

## 2019-09-27 NOTE — Discharge Instructions (Signed)
Discharge Instructions  - Continue daily dressing changes. Please keep the incision clean and dry - Take all medication as prescribed. Transition to over the counter pain medication as your pain improves - Keep the hand elevated over the next 48-72 hours to help with pain and swelling - Move all digits not restricted by the dressings regularly to prevent stiffness - Please call to schedule a follow up appointment with Dr. Jeannie Fend  at [phone] for 7-10 days following discharge - Your pain medication have been send digitally to your pharmacy

## 2019-09-27 NOTE — Progress Notes (Signed)
1800 IV antibiotics completed for today. PICC was removed by IV team. Discharge instructions given to pt, verbalized understanding.  [address] just came, discharged to home accompanied by son.

## 2019-09-27 NOTE — Care Management (Signed)
Hospital follow-up appointment scheduled at:  [facility] 10/26/19 @ 0920; AVS updated  Midge Minium RN, MSN, NCM-BC, ACM-RN [phone]

## 2019-09-27 NOTE — Progress Notes (Signed)
   Ortho Hand Progress Note  Subjective: No acute events last night. PICC line placed     Objective: Vital signs in last 24 hours: Temp:  [97.7 F (36.5 C)-98.6 F (37 C)] 97.7 F (36.5 C) (11/23 0330) Pulse Rate:  [57-71] 60 (11/23 0330) Resp:  [20] 20 (11/22 1434) BP: (100-127)/(65-79) 100/71 (11/23 0330) SpO2:  [97 %-100 %] 100 % (11/23 0330)  Intake/Output from previous day: 11/22 0701 - 11/23 0700 In: 960 [P.O.:960] Out: -  Intake/Output this shift: No intake/output data recorded.  Recent Labs    09/26/19 0916  HGB 11.8*   Recent Labs    09/26/19 0916  WBC 5.8  RBC 4.35  HCT 37.0*  PLT 232   Recent Labs    09/26/19 0916  NA 139  K 4.0  CL 110  CO2 21*  BUN 13  CREATININE 0.68  GLUCOSE 89  CALCIUM 8.5*   No results for input(s): LABPT, INR in the last 72 hours.  Aaox3 nad Resp nonlabored RRR LUE: shortened MF. Dressings changed. Incision intact with no drainage today. Decreased swelling to the digit. Improved erythema along the volar MF. Exposed digits wwp with bcr and minimal swelling. Decreased sensation throughout the fingers which is at his baseline   Assessment/Plan: Left MF distal phalanx osteomyelitis s/p I&D and revision amputation, POD4  - Medicine primary. Appreciate management - Dry dressing changes daily by nursine - Cxs. MSSA, Group B strep - Abx per primary. Appreciate ID input - Elevate LUE - Remainder per primary   OK for d/c from hand standpoint. Continue daily dressing changes. Follow up with me in 1 week   Avanell Shackleton III 09/27/2019, 9:54 AM  [phone]

## 2019-09-27 NOTE — Progress Notes (Signed)
Peripherally Inserted Central Catheter/Midline Placement  The IV Nurse has discussed with the patient and/or persons authorized to consent for the patient, the purpose of this procedure and the potential benefits and risks involved with this procedure.  The benefits include less needle sticks, lab draws from the catheter, and the patient may be discharged home with the catheter. Risks include, but not limited to, infection, bleeding, blood clot (thrombus formation), and puncture of an artery; nerve damage and irregular heartbeat and possibility to perform a PICC exchange if needed/ordered by physician.  Alternatives to this procedure were also discussed.  Bard Power PICC patient education guide, fact sheet on infection prevention and patient information card has been provided to patient /or left at bedside.    PICC/Midline Placement Documentation  PICC Single Lumen [id number] PICC Right Basilic 42 cm 0 cm (Active)  Indication for Insertion or Continuance of Line Home intravenous therapies (PICC only) 09/27/19 0921  Exposed Catheter (cm) 0 cm 09/27/19 [id number]  Site Assessment Clean;Dry;Intact 09/27/19 [id number]  Line Status Flushed;Blood return noted;Saline locked 09/27/19 0921  Dressing Type Transparent 09/27/19 0921  Dressing Status Clean;Dry;Intact;Antimicrobial disc in place 09/27/19 0921  Dressing Change Due 10/04/19 09/27/19 [id number]       Scotty [address] 09/27/2019, 9:22 AM

## 2019-09-28 LAB — AEROBIC/ANAEROBIC CULTURE W GRAM STAIN (SURGICAL/DEEP WOUND)

## 2019-10-05 DIAGNOSIS — Z4789 Encounter for other orthopedic aftercare: Secondary | ICD-10-CM | POA: Diagnosis not present

## 2019-10-06 ENCOUNTER — Other Ambulatory Visit: Payer: Self-pay

## 2019-10-06 ENCOUNTER — Ambulatory Visit (HOSPITAL_COMMUNITY)
Admission: RE | Admit: 2019-10-06 | Discharge: 2019-10-06 | Disposition: A | Discharge status: Home / Self Care | Payer: Medicaid Other | Source: Ambulatory Visit | Attending: Internal Medicine | Admitting: Internal Medicine

## 2019-10-06 DIAGNOSIS — M869 Osteomyelitis, unspecified: Secondary | ICD-10-CM | POA: Diagnosis not present

## 2019-10-06 DIAGNOSIS — B9561 Methicillin susceptible Staphylococcus aureus infection as the cause of diseases classified elsewhere: Secondary | ICD-10-CM | POA: Insufficient documentation

## 2019-10-06 MED ORDER — DALBAVANCIN HCL 500 MG IV SOLR: 500.0000 mg | Freq: Once | INTRAVENOUS | Status: DC

## 2019-10-06 MED ORDER — DEXTROSE 5 % IV SOLN
500.0000 mg | Freq: Once | INTRAVENOUS | Status: AC
  Administered 2019-10-06: 11:00:00 500 mg via INTRAVENOUS
  Filled 2019-10-06: qty 25

## 2019-10-12 ENCOUNTER — Other Ambulatory Visit (HOSPITAL_COMMUNITY): Payer: Self-pay | Admitting: *Deleted

## 2019-10-13 ENCOUNTER — Ambulatory Visit (HOSPITAL_COMMUNITY)
Admission: RE | Admit: 2019-10-13 | Discharge: 2019-10-13 | Disposition: A | Discharge status: Home / Self Care | Payer: Medicaid Other | Source: Ambulatory Visit | Attending: Internal Medicine | Admitting: Internal Medicine

## 2019-10-13 ENCOUNTER — Other Ambulatory Visit: Payer: Self-pay

## 2019-10-13 DIAGNOSIS — M869 Osteomyelitis, unspecified: Secondary | ICD-10-CM | POA: Insufficient documentation

## 2019-10-13 MED ORDER — DEXTROSE 5 % IV SOLN
500.0000 mg | Freq: Once | INTRAVENOUS | Status: DC
  Administered 2019-10-13: 500 mg via INTRAVENOUS
  Filled 2019-10-13: qty 25

## 2019-10-20 ENCOUNTER — Other Ambulatory Visit: Payer: Self-pay

## 2019-10-20 ENCOUNTER — Ambulatory Visit (HOSPITAL_COMMUNITY)
Admission: RE | Admit: 2019-10-20 | Discharge: 2019-10-20 | Disposition: A | Discharge status: Home / Self Care | Payer: Medicaid Other | Source: Ambulatory Visit | Attending: Internal Medicine | Admitting: Internal Medicine

## 2019-10-20 DIAGNOSIS — M869 Osteomyelitis, unspecified: Secondary | ICD-10-CM | POA: Diagnosis not present

## 2019-10-20 MED ORDER — DEXTROSE 5 % IV SOLN
500.0000 mg | Freq: Once | INTRAVENOUS | Status: DC
  Administered 2019-10-20: 500 mg via INTRAVENOUS
  Filled 2019-10-20: qty 25

## 2019-10-26 ENCOUNTER — Inpatient Hospital Stay: Payer: Self-pay | Admitting: Family Medicine

## 2019-10-27 ENCOUNTER — Ambulatory Visit (HOSPITAL_COMMUNITY)
Admission: RE | Admit: 2019-10-27 | Discharge: 2019-10-27 | Disposition: A | Discharge status: Home / Self Care | Payer: Medicaid Other | Source: Ambulatory Visit | Attending: Internal Medicine | Admitting: Internal Medicine

## 2019-10-27 ENCOUNTER — Other Ambulatory Visit: Payer: Self-pay

## 2019-10-27 DIAGNOSIS — M869 Osteomyelitis, unspecified: Secondary | ICD-10-CM | POA: Insufficient documentation

## 2019-10-27 MED ORDER — DEXTROSE 5 % IV SOLN
500.0000 mg | Freq: Once | INTRAVENOUS | Status: DC
  Administered 2019-10-27: 500 mg via INTRAVENOUS
  Filled 2019-10-27: qty 25

## 2019-11-02 DIAGNOSIS — Z4789 Encounter for other orthopedic aftercare: Secondary | ICD-10-CM | POA: Diagnosis not present

## 2019-11-03 ENCOUNTER — Other Ambulatory Visit: Payer: Self-pay

## 2019-11-03 ENCOUNTER — Encounter (HOSPITAL_COMMUNITY): Payer: Self-pay

## 2019-11-03 ENCOUNTER — Ambulatory Visit (HOSPITAL_COMMUNITY)
Admission: RE | Admit: 2019-11-03 | Discharge: 2019-11-03 | Disposition: A | Discharge status: Home / Self Care | Payer: Medicaid Other | Source: Ambulatory Visit | Attending: Internal Medicine | Admitting: Internal Medicine

## 2019-11-03 DIAGNOSIS — M869 Osteomyelitis, unspecified: Secondary | ICD-10-CM | POA: Diagnosis not present

## 2019-11-03 MED ORDER — DEXTROSE 5 % IV SOLN
500.0000 mg | Freq: Once | INTRAVENOUS | Status: AC
  Administered 2019-11-03: 500 mg via INTRAVENOUS
  Filled 2019-11-03: qty 25

## 2020-05-04 DIAGNOSIS — Z419 Encounter for procedure for purposes other than remedying health state, unspecified: Secondary | ICD-10-CM | POA: Diagnosis not present

## 2020-06-04 DIAGNOSIS — Z419 Encounter for procedure for purposes other than remedying health state, unspecified: Secondary | ICD-10-CM | POA: Diagnosis not present

## 2020-07-05 DIAGNOSIS — Z419 Encounter for procedure for purposes other than remedying health state, unspecified: Secondary | ICD-10-CM | POA: Diagnosis not present

## 2020-07-12 DIAGNOSIS — R2689 Other abnormalities of gait and mobility: Secondary | ICD-10-CM | POA: Diagnosis not present

## 2020-07-12 DIAGNOSIS — Z130 Encounter for screening for diseases of the blood and blood-forming organs and certain disorders involving the immune mechanism: Secondary | ICD-10-CM | POA: Diagnosis not present

## 2020-07-12 DIAGNOSIS — Z1211 Encounter for screening for malignant neoplasm of colon: Secondary | ICD-10-CM | POA: Diagnosis not present

## 2020-07-12 DIAGNOSIS — Z13220 Encounter for screening for lipoid disorders: Secondary | ICD-10-CM | POA: Diagnosis not present

## 2020-07-12 DIAGNOSIS — R200 Anesthesia of skin: Secondary | ICD-10-CM | POA: Diagnosis not present

## 2020-07-12 DIAGNOSIS — Z125 Encounter for screening for malignant neoplasm of prostate: Secondary | ICD-10-CM | POA: Diagnosis not present

## 2020-07-12 DIAGNOSIS — Z0000 Encounter for general adult medical examination without abnormal findings: Secondary | ICD-10-CM | POA: Diagnosis not present

## 2020-07-12 DIAGNOSIS — S6720XS Crushing injury of unspecified hand, sequela: Secondary | ICD-10-CM | POA: Diagnosis not present

## 2020-08-04 DIAGNOSIS — Z419 Encounter for procedure for purposes other than remedying health state, unspecified: Secondary | ICD-10-CM | POA: Diagnosis not present

## 2020-08-18 DIAGNOSIS — R202 Paresthesia of skin: Secondary | ICD-10-CM | POA: Diagnosis not present

## 2020-08-18 DIAGNOSIS — R200 Anesthesia of skin: Secondary | ICD-10-CM | POA: Diagnosis not present

## 2020-08-18 DIAGNOSIS — G5623 Lesion of ulnar nerve, bilateral upper limbs: Secondary | ICD-10-CM | POA: Diagnosis not present

## 2020-08-18 DIAGNOSIS — G5603 Carpal tunnel syndrome, bilateral upper limbs: Secondary | ICD-10-CM | POA: Diagnosis not present

## 2020-08-18 DIAGNOSIS — M180 Bilateral primary osteoarthritis of first carpometacarpal joints: Secondary | ICD-10-CM | POA: Diagnosis not present

## 2020-09-04 DIAGNOSIS — Z419 Encounter for procedure for purposes other than remedying health state, unspecified: Secondary | ICD-10-CM | POA: Diagnosis not present

## 2020-09-11 DIAGNOSIS — M5412 Radiculopathy, cervical region: Secondary | ICD-10-CM | POA: Diagnosis not present

## 2020-09-11 DIAGNOSIS — R200 Anesthesia of skin: Secondary | ICD-10-CM | POA: Diagnosis not present

## 2020-10-04 DIAGNOSIS — Z419 Encounter for procedure for purposes other than remedying health state, unspecified: Secondary | ICD-10-CM | POA: Diagnosis not present

## 2020-10-06 DIAGNOSIS — R2681 Unsteadiness on feet: Secondary | ICD-10-CM | POA: Diagnosis not present

## 2020-10-06 DIAGNOSIS — M4802 Spinal stenosis, cervical region: Secondary | ICD-10-CM | POA: Diagnosis not present

## 2020-10-06 DIAGNOSIS — R200 Anesthesia of skin: Secondary | ICD-10-CM | POA: Diagnosis not present

## 2020-10-11 DIAGNOSIS — C720 Malignant neoplasm of spinal cord: Secondary | ICD-10-CM | POA: Diagnosis present

## 2020-10-11 DIAGNOSIS — G959 Disease of spinal cord, unspecified: Secondary | ICD-10-CM | POA: Diagnosis not present

## 2020-10-16 DIAGNOSIS — G959 Disease of spinal cord, unspecified: Secondary | ICD-10-CM | POA: Diagnosis not present

## 2020-10-16 DIAGNOSIS — R2689 Other abnormalities of gait and mobility: Secondary | ICD-10-CM | POA: Diagnosis not present

## 2020-10-16 DIAGNOSIS — R200 Anesthesia of skin: Secondary | ICD-10-CM | POA: Diagnosis not present

## 2020-10-17 DIAGNOSIS — D334 Benign neoplasm of spinal cord: Secondary | ICD-10-CM | POA: Diagnosis not present

## 2020-11-04 DIAGNOSIS — Z419 Encounter for procedure for purposes other than remedying health state, unspecified: Secondary | ICD-10-CM | POA: Diagnosis not present

## 2020-11-09 DIAGNOSIS — Z01810 Encounter for preprocedural cardiovascular examination: Secondary | ICD-10-CM | POA: Diagnosis not present

## 2020-11-09 DIAGNOSIS — Z01812 Encounter for preprocedural laboratory examination: Secondary | ICD-10-CM | POA: Diagnosis not present

## 2020-11-09 DIAGNOSIS — M4802 Spinal stenosis, cervical region: Secondary | ICD-10-CM | POA: Diagnosis not present

## 2020-11-09 DIAGNOSIS — Z01818 Encounter for other preprocedural examination: Secondary | ICD-10-CM | POA: Diagnosis not present

## 2020-11-23 DIAGNOSIS — D334 Benign neoplasm of spinal cord: Secondary | ICD-10-CM | POA: Diagnosis not present

## 2020-11-23 DIAGNOSIS — G9589 Other specified diseases of spinal cord: Secondary | ICD-10-CM | POA: Diagnosis not present

## 2020-11-23 DIAGNOSIS — D434 Neoplasm of uncertain behavior of spinal cord: Secondary | ICD-10-CM | POA: Diagnosis not present

## 2020-11-23 DIAGNOSIS — C720 Malignant neoplasm of spinal cord: Secondary | ICD-10-CM | POA: Diagnosis not present

## 2020-11-23 DIAGNOSIS — G959 Disease of spinal cord, unspecified: Secondary | ICD-10-CM | POA: Diagnosis not present

## 2020-11-23 DIAGNOSIS — Z8614 Personal history of Methicillin resistant Staphylococcus aureus infection: Secondary | ICD-10-CM | POA: Diagnosis not present

## 2020-11-23 DIAGNOSIS — Z882 Allergy status to sulfonamides status: Secondary | ICD-10-CM | POA: Diagnosis not present

## 2020-11-24 DIAGNOSIS — M5032 Other cervical disc degeneration, mid-cervical region, unspecified level: Secondary | ICD-10-CM | POA: Diagnosis not present

## 2020-11-24 DIAGNOSIS — M47812 Spondylosis without myelopathy or radiculopathy, cervical region: Secondary | ICD-10-CM | POA: Diagnosis not present

## 2020-11-28 DIAGNOSIS — C719 Malignant neoplasm of brain, unspecified: Secondary | ICD-10-CM | POA: Diagnosis not present

## 2020-11-29 DIAGNOSIS — C412 Malignant neoplasm of vertebral column: Secondary | ICD-10-CM | POA: Diagnosis not present

## 2020-11-29 DIAGNOSIS — M4184 Other forms of scoliosis, thoracic region: Secondary | ICD-10-CM | POA: Diagnosis not present

## 2020-11-29 DIAGNOSIS — C720 Malignant neoplasm of spinal cord: Secondary | ICD-10-CM | POA: Diagnosis not present

## 2020-11-30 DIAGNOSIS — C412 Malignant neoplasm of vertebral column: Secondary | ICD-10-CM | POA: Diagnosis not present

## 2020-12-05 DIAGNOSIS — Z419 Encounter for procedure for purposes other than remedying health state, unspecified: Secondary | ICD-10-CM | POA: Diagnosis not present

## 2020-12-08 DIAGNOSIS — R531 Weakness: Secondary | ICD-10-CM | POA: Diagnosis not present

## 2020-12-08 DIAGNOSIS — Z483 Aftercare following surgery for neoplasm: Secondary | ICD-10-CM | POA: Diagnosis not present

## 2020-12-08 DIAGNOSIS — R2689 Other abnormalities of gait and mobility: Secondary | ICD-10-CM | POA: Diagnosis not present

## 2020-12-08 DIAGNOSIS — G3184 Mild cognitive impairment, so stated: Secondary | ICD-10-CM | POA: Diagnosis not present

## 2020-12-08 DIAGNOSIS — M542 Cervicalgia: Secondary | ICD-10-CM | POA: Diagnosis not present

## 2020-12-08 DIAGNOSIS — R339 Retention of urine, unspecified: Secondary | ICD-10-CM | POA: Diagnosis not present

## 2020-12-08 DIAGNOSIS — Z7689 Persons encountering health services in other specified circumstances: Secondary | ICD-10-CM | POA: Diagnosis not present

## 2020-12-09 DIAGNOSIS — N319 Neuromuscular dysfunction of bladder, unspecified: Secondary | ICD-10-CM | POA: Diagnosis not present

## 2020-12-09 DIAGNOSIS — E538 Deficiency of other specified B group vitamins: Secondary | ICD-10-CM | POA: Diagnosis not present

## 2020-12-09 DIAGNOSIS — G8252 Quadriplegia, C1-C4 incomplete: Secondary | ICD-10-CM | POA: Diagnosis not present

## 2020-12-09 DIAGNOSIS — R200 Anesthesia of skin: Secondary | ICD-10-CM | POA: Diagnosis not present

## 2020-12-09 DIAGNOSIS — D434 Neoplasm of uncertain behavior of spinal cord: Secondary | ICD-10-CM | POA: Diagnosis not present

## 2020-12-09 DIAGNOSIS — G959 Disease of spinal cord, unspecified: Secondary | ICD-10-CM | POA: Diagnosis not present

## 2020-12-11 DIAGNOSIS — N319 Neuromuscular dysfunction of bladder, unspecified: Secondary | ICD-10-CM | POA: Diagnosis not present

## 2020-12-11 DIAGNOSIS — G8252 Quadriplegia, C1-C4 incomplete: Secondary | ICD-10-CM | POA: Diagnosis not present

## 2020-12-11 DIAGNOSIS — G959 Disease of spinal cord, unspecified: Secondary | ICD-10-CM | POA: Diagnosis not present

## 2020-12-12 DIAGNOSIS — G959 Disease of spinal cord, unspecified: Secondary | ICD-10-CM | POA: Diagnosis not present

## 2020-12-12 DIAGNOSIS — G8252 Quadriplegia, C1-C4 incomplete: Secondary | ICD-10-CM | POA: Diagnosis not present

## 2020-12-12 DIAGNOSIS — N319 Neuromuscular dysfunction of bladder, unspecified: Secondary | ICD-10-CM | POA: Diagnosis not present

## 2020-12-13 DIAGNOSIS — G959 Disease of spinal cord, unspecified: Secondary | ICD-10-CM | POA: Diagnosis not present

## 2020-12-13 DIAGNOSIS — N319 Neuromuscular dysfunction of bladder, unspecified: Secondary | ICD-10-CM | POA: Diagnosis not present

## 2020-12-13 DIAGNOSIS — G8252 Quadriplegia, C1-C4 incomplete: Secondary | ICD-10-CM | POA: Diagnosis not present

## 2020-12-14 DIAGNOSIS — N319 Neuromuscular dysfunction of bladder, unspecified: Secondary | ICD-10-CM | POA: Diagnosis not present

## 2020-12-14 DIAGNOSIS — G959 Disease of spinal cord, unspecified: Secondary | ICD-10-CM | POA: Diagnosis not present

## 2020-12-14 DIAGNOSIS — G8252 Quadriplegia, C1-C4 incomplete: Secondary | ICD-10-CM | POA: Diagnosis not present

## 2020-12-15 DIAGNOSIS — G8252 Quadriplegia, C1-C4 incomplete: Secondary | ICD-10-CM | POA: Diagnosis not present

## 2020-12-15 DIAGNOSIS — G959 Disease of spinal cord, unspecified: Secondary | ICD-10-CM | POA: Diagnosis not present

## 2020-12-15 DIAGNOSIS — N319 Neuromuscular dysfunction of bladder, unspecified: Secondary | ICD-10-CM | POA: Diagnosis not present

## 2020-12-18 DIAGNOSIS — N319 Neuromuscular dysfunction of bladder, unspecified: Secondary | ICD-10-CM | POA: Diagnosis not present

## 2020-12-18 DIAGNOSIS — G959 Disease of spinal cord, unspecified: Secondary | ICD-10-CM | POA: Diagnosis not present

## 2020-12-18 DIAGNOSIS — G8252 Quadriplegia, C1-C4 incomplete: Secondary | ICD-10-CM | POA: Diagnosis not present

## 2020-12-19 DIAGNOSIS — G959 Disease of spinal cord, unspecified: Secondary | ICD-10-CM | POA: Diagnosis not present

## 2020-12-19 DIAGNOSIS — N319 Neuromuscular dysfunction of bladder, unspecified: Secondary | ICD-10-CM | POA: Diagnosis not present

## 2020-12-19 DIAGNOSIS — G8252 Quadriplegia, C1-C4 incomplete: Secondary | ICD-10-CM | POA: Diagnosis not present

## 2020-12-20 DIAGNOSIS — N319 Neuromuscular dysfunction of bladder, unspecified: Secondary | ICD-10-CM | POA: Diagnosis not present

## 2020-12-20 DIAGNOSIS — G8252 Quadriplegia, C1-C4 incomplete: Secondary | ICD-10-CM | POA: Diagnosis not present

## 2020-12-20 DIAGNOSIS — G959 Disease of spinal cord, unspecified: Secondary | ICD-10-CM | POA: Diagnosis not present

## 2020-12-21 DIAGNOSIS — G959 Disease of spinal cord, unspecified: Secondary | ICD-10-CM | POA: Diagnosis not present

## 2020-12-21 DIAGNOSIS — G8252 Quadriplegia, C1-C4 incomplete: Secondary | ICD-10-CM | POA: Diagnosis not present

## 2020-12-21 DIAGNOSIS — N319 Neuromuscular dysfunction of bladder, unspecified: Secondary | ICD-10-CM | POA: Diagnosis not present

## 2020-12-22 DIAGNOSIS — G959 Disease of spinal cord, unspecified: Secondary | ICD-10-CM | POA: Diagnosis not present

## 2020-12-22 DIAGNOSIS — G8252 Quadriplegia, C1-C4 incomplete: Secondary | ICD-10-CM | POA: Diagnosis not present

## 2020-12-22 DIAGNOSIS — N319 Neuromuscular dysfunction of bladder, unspecified: Secondary | ICD-10-CM | POA: Diagnosis not present

## 2020-12-25 DIAGNOSIS — G8252 Quadriplegia, C1-C4 incomplete: Secondary | ICD-10-CM | POA: Diagnosis not present

## 2020-12-25 DIAGNOSIS — N319 Neuromuscular dysfunction of bladder, unspecified: Secondary | ICD-10-CM | POA: Diagnosis not present

## 2020-12-25 DIAGNOSIS — G959 Disease of spinal cord, unspecified: Secondary | ICD-10-CM | POA: Diagnosis not present

## 2020-12-27 DIAGNOSIS — G959 Disease of spinal cord, unspecified: Secondary | ICD-10-CM | POA: Diagnosis not present

## 2020-12-27 DIAGNOSIS — N319 Neuromuscular dysfunction of bladder, unspecified: Secondary | ICD-10-CM | POA: Diagnosis not present

## 2020-12-27 DIAGNOSIS — G8252 Quadriplegia, C1-C4 incomplete: Secondary | ICD-10-CM | POA: Diagnosis not present

## 2020-12-28 DIAGNOSIS — N319 Neuromuscular dysfunction of bladder, unspecified: Secondary | ICD-10-CM | POA: Diagnosis not present

## 2020-12-28 DIAGNOSIS — G959 Disease of spinal cord, unspecified: Secondary | ICD-10-CM | POA: Diagnosis not present

## 2020-12-28 DIAGNOSIS — G8252 Quadriplegia, C1-C4 incomplete: Secondary | ICD-10-CM | POA: Diagnosis not present

## 2021-01-01 DIAGNOSIS — N319 Neuromuscular dysfunction of bladder, unspecified: Secondary | ICD-10-CM | POA: Diagnosis not present

## 2021-01-01 DIAGNOSIS — G8252 Quadriplegia, C1-C4 incomplete: Secondary | ICD-10-CM | POA: Diagnosis not present

## 2021-01-01 DIAGNOSIS — G959 Disease of spinal cord, unspecified: Secondary | ICD-10-CM | POA: Diagnosis not present

## 2021-01-02 DIAGNOSIS — N319 Neuromuscular dysfunction of bladder, unspecified: Secondary | ICD-10-CM | POA: Diagnosis not present

## 2021-01-02 DIAGNOSIS — G8252 Quadriplegia, C1-C4 incomplete: Secondary | ICD-10-CM | POA: Diagnosis not present

## 2021-01-02 DIAGNOSIS — Z419 Encounter for procedure for purposes other than remedying health state, unspecified: Secondary | ICD-10-CM | POA: Diagnosis not present

## 2021-01-02 DIAGNOSIS — G959 Disease of spinal cord, unspecified: Secondary | ICD-10-CM | POA: Diagnosis not present

## 2021-01-03 DIAGNOSIS — N319 Neuromuscular dysfunction of bladder, unspecified: Secondary | ICD-10-CM | POA: Diagnosis not present

## 2021-01-03 DIAGNOSIS — G8252 Quadriplegia, C1-C4 incomplete: Secondary | ICD-10-CM | POA: Diagnosis not present

## 2021-01-03 DIAGNOSIS — G959 Disease of spinal cord, unspecified: Secondary | ICD-10-CM | POA: Diagnosis not present

## 2021-01-04 DIAGNOSIS — G959 Disease of spinal cord, unspecified: Secondary | ICD-10-CM | POA: Diagnosis not present

## 2021-01-04 DIAGNOSIS — G8252 Quadriplegia, C1-C4 incomplete: Secondary | ICD-10-CM | POA: Diagnosis not present

## 2021-01-04 DIAGNOSIS — N319 Neuromuscular dysfunction of bladder, unspecified: Secondary | ICD-10-CM | POA: Diagnosis not present

## 2021-01-05 DIAGNOSIS — G8252 Quadriplegia, C1-C4 incomplete: Secondary | ICD-10-CM | POA: Diagnosis not present

## 2021-01-05 DIAGNOSIS — G959 Disease of spinal cord, unspecified: Secondary | ICD-10-CM | POA: Diagnosis not present

## 2021-01-05 DIAGNOSIS — N319 Neuromuscular dysfunction of bladder, unspecified: Secondary | ICD-10-CM | POA: Diagnosis not present

## 2021-01-08 DIAGNOSIS — G8252 Quadriplegia, C1-C4 incomplete: Secondary | ICD-10-CM | POA: Diagnosis not present

## 2021-01-08 DIAGNOSIS — G959 Disease of spinal cord, unspecified: Secondary | ICD-10-CM | POA: Diagnosis not present

## 2021-01-08 DIAGNOSIS — N319 Neuromuscular dysfunction of bladder, unspecified: Secondary | ICD-10-CM | POA: Diagnosis not present

## 2021-01-09 DIAGNOSIS — R339 Retention of urine, unspecified: Secondary | ICD-10-CM | POA: Diagnosis not present

## 2021-01-09 DIAGNOSIS — L309 Dermatitis, unspecified: Secondary | ICD-10-CM | POA: Diagnosis not present

## 2021-01-09 DIAGNOSIS — M792 Neuralgia and neuritis, unspecified: Secondary | ICD-10-CM | POA: Diagnosis not present

## 2021-01-09 DIAGNOSIS — M961 Postlaminectomy syndrome, not elsewhere classified: Secondary | ICD-10-CM | POA: Diagnosis not present

## 2021-01-09 DIAGNOSIS — Z7409 Other reduced mobility: Secondary | ICD-10-CM | POA: Diagnosis not present

## 2021-01-10 DIAGNOSIS — M961 Postlaminectomy syndrome, not elsewhere classified: Secondary | ICD-10-CM | POA: Diagnosis not present

## 2021-01-10 DIAGNOSIS — R339 Retention of urine, unspecified: Secondary | ICD-10-CM | POA: Diagnosis not present

## 2021-01-10 DIAGNOSIS — M792 Neuralgia and neuritis, unspecified: Secondary | ICD-10-CM | POA: Diagnosis not present

## 2021-01-10 DIAGNOSIS — L309 Dermatitis, unspecified: Secondary | ICD-10-CM | POA: Diagnosis not present

## 2021-01-10 DIAGNOSIS — Z7409 Other reduced mobility: Secondary | ICD-10-CM | POA: Diagnosis not present

## 2021-01-11 DIAGNOSIS — M792 Neuralgia and neuritis, unspecified: Secondary | ICD-10-CM | POA: Diagnosis not present

## 2021-01-11 DIAGNOSIS — Z7409 Other reduced mobility: Secondary | ICD-10-CM | POA: Diagnosis not present

## 2021-01-11 DIAGNOSIS — R339 Retention of urine, unspecified: Secondary | ICD-10-CM | POA: Diagnosis not present

## 2021-01-11 DIAGNOSIS — M961 Postlaminectomy syndrome, not elsewhere classified: Secondary | ICD-10-CM | POA: Diagnosis not present

## 2021-01-11 DIAGNOSIS — L309 Dermatitis, unspecified: Secondary | ICD-10-CM | POA: Diagnosis not present

## 2021-01-12 DIAGNOSIS — Z7409 Other reduced mobility: Secondary | ICD-10-CM | POA: Diagnosis not present

## 2021-01-12 DIAGNOSIS — M961 Postlaminectomy syndrome, not elsewhere classified: Secondary | ICD-10-CM | POA: Diagnosis not present

## 2021-01-12 DIAGNOSIS — L309 Dermatitis, unspecified: Secondary | ICD-10-CM | POA: Diagnosis not present

## 2021-01-12 DIAGNOSIS — R339 Retention of urine, unspecified: Secondary | ICD-10-CM | POA: Diagnosis not present

## 2021-01-12 DIAGNOSIS — M792 Neuralgia and neuritis, unspecified: Secondary | ICD-10-CM | POA: Diagnosis not present

## 2021-01-15 DIAGNOSIS — R339 Retention of urine, unspecified: Secondary | ICD-10-CM | POA: Diagnosis not present

## 2021-01-15 DIAGNOSIS — M961 Postlaminectomy syndrome, not elsewhere classified: Secondary | ICD-10-CM | POA: Diagnosis not present

## 2021-01-15 DIAGNOSIS — M792 Neuralgia and neuritis, unspecified: Secondary | ICD-10-CM | POA: Diagnosis not present

## 2021-01-15 DIAGNOSIS — L309 Dermatitis, unspecified: Secondary | ICD-10-CM | POA: Diagnosis not present

## 2021-01-15 DIAGNOSIS — Z7409 Other reduced mobility: Secondary | ICD-10-CM | POA: Diagnosis not present

## 2021-01-16 DIAGNOSIS — M792 Neuralgia and neuritis, unspecified: Secondary | ICD-10-CM | POA: Diagnosis not present

## 2021-01-16 DIAGNOSIS — M961 Postlaminectomy syndrome, not elsewhere classified: Secondary | ICD-10-CM | POA: Diagnosis not present

## 2021-01-16 DIAGNOSIS — L309 Dermatitis, unspecified: Secondary | ICD-10-CM | POA: Diagnosis not present

## 2021-01-16 DIAGNOSIS — R339 Retention of urine, unspecified: Secondary | ICD-10-CM | POA: Diagnosis not present

## 2021-01-16 DIAGNOSIS — Z7409 Other reduced mobility: Secondary | ICD-10-CM | POA: Diagnosis not present

## 2021-01-17 DIAGNOSIS — L309 Dermatitis, unspecified: Secondary | ICD-10-CM | POA: Diagnosis not present

## 2021-01-17 DIAGNOSIS — M961 Postlaminectomy syndrome, not elsewhere classified: Secondary | ICD-10-CM | POA: Diagnosis not present

## 2021-01-17 DIAGNOSIS — Z7409 Other reduced mobility: Secondary | ICD-10-CM | POA: Diagnosis not present

## 2021-01-17 DIAGNOSIS — R339 Retention of urine, unspecified: Secondary | ICD-10-CM | POA: Diagnosis not present

## 2021-01-17 DIAGNOSIS — M792 Neuralgia and neuritis, unspecified: Secondary | ICD-10-CM | POA: Diagnosis not present

## 2021-01-18 DIAGNOSIS — R339 Retention of urine, unspecified: Secondary | ICD-10-CM | POA: Diagnosis not present

## 2021-01-18 DIAGNOSIS — L309 Dermatitis, unspecified: Secondary | ICD-10-CM | POA: Diagnosis not present

## 2021-01-18 DIAGNOSIS — Z7409 Other reduced mobility: Secondary | ICD-10-CM | POA: Diagnosis not present

## 2021-01-18 DIAGNOSIS — M792 Neuralgia and neuritis, unspecified: Secondary | ICD-10-CM | POA: Diagnosis not present

## 2021-01-18 DIAGNOSIS — M961 Postlaminectomy syndrome, not elsewhere classified: Secondary | ICD-10-CM | POA: Diagnosis not present

## 2021-01-19 DIAGNOSIS — G959 Disease of spinal cord, unspecified: Secondary | ICD-10-CM | POA: Diagnosis not present

## 2021-01-19 DIAGNOSIS — N319 Neuromuscular dysfunction of bladder, unspecified: Secondary | ICD-10-CM | POA: Diagnosis not present

## 2021-01-19 DIAGNOSIS — G8252 Quadriplegia, C1-C4 incomplete: Secondary | ICD-10-CM | POA: Diagnosis not present

## 2021-01-20 DIAGNOSIS — N319 Neuromuscular dysfunction of bladder, unspecified: Secondary | ICD-10-CM | POA: Diagnosis not present

## 2021-01-20 DIAGNOSIS — G959 Disease of spinal cord, unspecified: Secondary | ICD-10-CM | POA: Diagnosis not present

## 2021-01-20 DIAGNOSIS — G8252 Quadriplegia, C1-C4 incomplete: Secondary | ICD-10-CM | POA: Diagnosis not present

## 2021-01-22 DIAGNOSIS — G8252 Quadriplegia, C1-C4 incomplete: Secondary | ICD-10-CM | POA: Diagnosis not present

## 2021-01-22 DIAGNOSIS — G959 Disease of spinal cord, unspecified: Secondary | ICD-10-CM | POA: Diagnosis not present

## 2021-01-22 DIAGNOSIS — N319 Neuromuscular dysfunction of bladder, unspecified: Secondary | ICD-10-CM | POA: Diagnosis not present

## 2021-01-23 DIAGNOSIS — N319 Neuromuscular dysfunction of bladder, unspecified: Secondary | ICD-10-CM | POA: Diagnosis not present

## 2021-01-23 DIAGNOSIS — G959 Disease of spinal cord, unspecified: Secondary | ICD-10-CM | POA: Diagnosis not present

## 2021-01-23 DIAGNOSIS — G8252 Quadriplegia, C1-C4 incomplete: Secondary | ICD-10-CM | POA: Diagnosis not present

## 2021-01-24 DIAGNOSIS — G959 Disease of spinal cord, unspecified: Secondary | ICD-10-CM | POA: Diagnosis not present

## 2021-01-24 DIAGNOSIS — N319 Neuromuscular dysfunction of bladder, unspecified: Secondary | ICD-10-CM | POA: Diagnosis not present

## 2021-01-24 DIAGNOSIS — G8252 Quadriplegia, C1-C4 incomplete: Secondary | ICD-10-CM | POA: Diagnosis not present

## 2021-01-25 DIAGNOSIS — G8252 Quadriplegia, C1-C4 incomplete: Secondary | ICD-10-CM | POA: Diagnosis not present

## 2021-01-25 DIAGNOSIS — N319 Neuromuscular dysfunction of bladder, unspecified: Secondary | ICD-10-CM | POA: Diagnosis not present

## 2021-01-25 DIAGNOSIS — G959 Disease of spinal cord, unspecified: Secondary | ICD-10-CM | POA: Diagnosis not present

## 2021-01-26 DIAGNOSIS — G959 Disease of spinal cord, unspecified: Secondary | ICD-10-CM | POA: Diagnosis not present

## 2021-01-26 DIAGNOSIS — G8252 Quadriplegia, C1-C4 incomplete: Secondary | ICD-10-CM | POA: Diagnosis not present

## 2021-01-26 DIAGNOSIS — N319 Neuromuscular dysfunction of bladder, unspecified: Secondary | ICD-10-CM | POA: Diagnosis not present

## 2021-02-01 DIAGNOSIS — G959 Disease of spinal cord, unspecified: Secondary | ICD-10-CM | POA: Diagnosis not present

## 2021-02-01 DIAGNOSIS — N319 Neuromuscular dysfunction of bladder, unspecified: Secondary | ICD-10-CM | POA: Diagnosis not present

## 2021-02-01 DIAGNOSIS — G8252 Quadriplegia, C1-C4 incomplete: Secondary | ICD-10-CM | POA: Diagnosis not present

## 2021-02-02 DIAGNOSIS — N319 Neuromuscular dysfunction of bladder, unspecified: Secondary | ICD-10-CM | POA: Diagnosis not present

## 2021-02-02 DIAGNOSIS — G8252 Quadriplegia, C1-C4 incomplete: Secondary | ICD-10-CM | POA: Diagnosis not present

## 2021-02-02 DIAGNOSIS — Z419 Encounter for procedure for purposes other than remedying health state, unspecified: Secondary | ICD-10-CM | POA: Diagnosis not present

## 2021-02-02 DIAGNOSIS — G959 Disease of spinal cord, unspecified: Secondary | ICD-10-CM | POA: Diagnosis not present

## 2021-02-03 DIAGNOSIS — M961 Postlaminectomy syndrome, not elsewhere classified: Secondary | ICD-10-CM | POA: Diagnosis not present

## 2021-02-03 DIAGNOSIS — M792 Neuralgia and neuritis, unspecified: Secondary | ICD-10-CM | POA: Diagnosis not present

## 2021-02-03 DIAGNOSIS — L309 Dermatitis, unspecified: Secondary | ICD-10-CM | POA: Diagnosis not present

## 2021-02-03 DIAGNOSIS — R339 Retention of urine, unspecified: Secondary | ICD-10-CM | POA: Diagnosis not present

## 2021-02-03 DIAGNOSIS — Z7409 Other reduced mobility: Secondary | ICD-10-CM | POA: Diagnosis not present

## 2021-02-06 DIAGNOSIS — Z79899 Other long term (current) drug therapy: Secondary | ICD-10-CM | POA: Diagnosis not present

## 2021-02-06 DIAGNOSIS — Z4789 Encounter for other orthopedic aftercare: Secondary | ICD-10-CM | POA: Diagnosis not present

## 2021-02-06 DIAGNOSIS — R339 Retention of urine, unspecified: Secondary | ICD-10-CM | POA: Diagnosis not present

## 2021-02-06 DIAGNOSIS — R4189 Other symptoms and signs involving cognitive functions and awareness: Secondary | ICD-10-CM | POA: Diagnosis not present

## 2021-02-06 DIAGNOSIS — Z9889 Other specified postprocedural states: Secondary | ICD-10-CM | POA: Diagnosis not present

## 2021-02-07 DIAGNOSIS — Z9889 Other specified postprocedural states: Secondary | ICD-10-CM | POA: Diagnosis not present

## 2021-02-07 DIAGNOSIS — R339 Retention of urine, unspecified: Secondary | ICD-10-CM | POA: Diagnosis not present

## 2021-02-07 DIAGNOSIS — Z79899 Other long term (current) drug therapy: Secondary | ICD-10-CM | POA: Diagnosis not present

## 2021-02-07 DIAGNOSIS — Z4789 Encounter for other orthopedic aftercare: Secondary | ICD-10-CM | POA: Diagnosis not present

## 2021-02-07 DIAGNOSIS — R4189 Other symptoms and signs involving cognitive functions and awareness: Secondary | ICD-10-CM | POA: Diagnosis not present

## 2021-02-08 DIAGNOSIS — Z4789 Encounter for other orthopedic aftercare: Secondary | ICD-10-CM | POA: Diagnosis not present

## 2021-02-08 DIAGNOSIS — R4189 Other symptoms and signs involving cognitive functions and awareness: Secondary | ICD-10-CM | POA: Diagnosis not present

## 2021-02-08 DIAGNOSIS — Z9889 Other specified postprocedural states: Secondary | ICD-10-CM | POA: Diagnosis not present

## 2021-02-08 DIAGNOSIS — Z79899 Other long term (current) drug therapy: Secondary | ICD-10-CM | POA: Diagnosis not present

## 2021-02-08 DIAGNOSIS — R339 Retention of urine, unspecified: Secondary | ICD-10-CM | POA: Diagnosis not present

## 2021-02-09 DIAGNOSIS — R339 Retention of urine, unspecified: Secondary | ICD-10-CM | POA: Diagnosis not present

## 2021-02-09 DIAGNOSIS — G8252 Quadriplegia, C1-C4 incomplete: Secondary | ICD-10-CM | POA: Diagnosis not present

## 2021-02-09 DIAGNOSIS — R4189 Other symptoms and signs involving cognitive functions and awareness: Secondary | ICD-10-CM | POA: Diagnosis not present

## 2021-02-09 DIAGNOSIS — Z4789 Encounter for other orthopedic aftercare: Secondary | ICD-10-CM | POA: Diagnosis not present

## 2021-02-09 DIAGNOSIS — Z9889 Other specified postprocedural states: Secondary | ICD-10-CM | POA: Diagnosis not present

## 2021-02-09 DIAGNOSIS — Z79899 Other long term (current) drug therapy: Secondary | ICD-10-CM | POA: Diagnosis not present

## 2021-02-10 DIAGNOSIS — R69 Illness, unspecified: Secondary | ICD-10-CM | POA: Diagnosis not present

## 2021-02-10 DIAGNOSIS — G8252 Quadriplegia, C1-C4 incomplete: Secondary | ICD-10-CM | POA: Diagnosis not present

## 2021-02-11 DIAGNOSIS — G8252 Quadriplegia, C1-C4 incomplete: Secondary | ICD-10-CM | POA: Diagnosis not present

## 2021-02-11 DIAGNOSIS — R69 Illness, unspecified: Secondary | ICD-10-CM | POA: Diagnosis not present

## 2021-02-12 DIAGNOSIS — G8252 Quadriplegia, C1-C4 incomplete: Secondary | ICD-10-CM | POA: Diagnosis not present

## 2021-02-12 DIAGNOSIS — R69 Illness, unspecified: Secondary | ICD-10-CM | POA: Diagnosis not present

## 2021-02-13 DIAGNOSIS — Z79899 Other long term (current) drug therapy: Secondary | ICD-10-CM | POA: Diagnosis not present

## 2021-02-13 DIAGNOSIS — R69 Illness, unspecified: Secondary | ICD-10-CM | POA: Diagnosis not present

## 2021-02-13 DIAGNOSIS — R4189 Other symptoms and signs involving cognitive functions and awareness: Secondary | ICD-10-CM | POA: Diagnosis not present

## 2021-02-13 DIAGNOSIS — Z4789 Encounter for other orthopedic aftercare: Secondary | ICD-10-CM | POA: Diagnosis not present

## 2021-02-13 DIAGNOSIS — R339 Retention of urine, unspecified: Secondary | ICD-10-CM | POA: Diagnosis not present

## 2021-02-13 DIAGNOSIS — G8252 Quadriplegia, C1-C4 incomplete: Secondary | ICD-10-CM | POA: Diagnosis not present

## 2021-02-13 DIAGNOSIS — Z9889 Other specified postprocedural states: Secondary | ICD-10-CM | POA: Diagnosis not present

## 2021-02-14 DIAGNOSIS — R69 Illness, unspecified: Secondary | ICD-10-CM | POA: Diagnosis not present

## 2021-02-14 DIAGNOSIS — R4189 Other symptoms and signs involving cognitive functions and awareness: Secondary | ICD-10-CM | POA: Diagnosis not present

## 2021-02-14 DIAGNOSIS — Z9889 Other specified postprocedural states: Secondary | ICD-10-CM | POA: Diagnosis not present

## 2021-02-14 DIAGNOSIS — G8252 Quadriplegia, C1-C4 incomplete: Secondary | ICD-10-CM | POA: Diagnosis not present

## 2021-02-14 DIAGNOSIS — R339 Retention of urine, unspecified: Secondary | ICD-10-CM | POA: Diagnosis not present

## 2021-02-14 DIAGNOSIS — Z4789 Encounter for other orthopedic aftercare: Secondary | ICD-10-CM | POA: Diagnosis not present

## 2021-02-14 DIAGNOSIS — Z79899 Other long term (current) drug therapy: Secondary | ICD-10-CM | POA: Diagnosis not present

## 2021-02-15 DIAGNOSIS — Z4789 Encounter for other orthopedic aftercare: Secondary | ICD-10-CM | POA: Diagnosis not present

## 2021-02-15 DIAGNOSIS — Z79899 Other long term (current) drug therapy: Secondary | ICD-10-CM | POA: Diagnosis not present

## 2021-02-15 DIAGNOSIS — G8252 Quadriplegia, C1-C4 incomplete: Secondary | ICD-10-CM | POA: Diagnosis not present

## 2021-02-15 DIAGNOSIS — R339 Retention of urine, unspecified: Secondary | ICD-10-CM | POA: Diagnosis not present

## 2021-02-15 DIAGNOSIS — Z9889 Other specified postprocedural states: Secondary | ICD-10-CM | POA: Diagnosis not present

## 2021-02-15 DIAGNOSIS — R4189 Other symptoms and signs involving cognitive functions and awareness: Secondary | ICD-10-CM | POA: Diagnosis not present

## 2021-02-16 DIAGNOSIS — G8252 Quadriplegia, C1-C4 incomplete: Secondary | ICD-10-CM | POA: Diagnosis not present

## 2021-02-17 DIAGNOSIS — G8252 Quadriplegia, C1-C4 incomplete: Secondary | ICD-10-CM | POA: Diagnosis not present

## 2021-02-18 DIAGNOSIS — G8252 Quadriplegia, C1-C4 incomplete: Secondary | ICD-10-CM | POA: Diagnosis not present

## 2021-02-19 DIAGNOSIS — R4189 Other symptoms and signs involving cognitive functions and awareness: Secondary | ICD-10-CM | POA: Diagnosis not present

## 2021-02-19 DIAGNOSIS — Z9889 Other specified postprocedural states: Secondary | ICD-10-CM | POA: Diagnosis not present

## 2021-02-19 DIAGNOSIS — Z4789 Encounter for other orthopedic aftercare: Secondary | ICD-10-CM | POA: Diagnosis not present

## 2021-02-19 DIAGNOSIS — Z79899 Other long term (current) drug therapy: Secondary | ICD-10-CM | POA: Diagnosis not present

## 2021-02-19 DIAGNOSIS — R339 Retention of urine, unspecified: Secondary | ICD-10-CM | POA: Diagnosis not present

## 2021-02-19 DIAGNOSIS — G8252 Quadriplegia, C1-C4 incomplete: Secondary | ICD-10-CM | POA: Diagnosis not present

## 2021-02-20 DIAGNOSIS — R2689 Other abnormalities of gait and mobility: Secondary | ICD-10-CM | POA: Diagnosis not present

## 2021-02-20 DIAGNOSIS — C720 Malignant neoplasm of spinal cord: Secondary | ICD-10-CM | POA: Diagnosis not present

## 2021-02-20 DIAGNOSIS — Z4789 Encounter for other orthopedic aftercare: Secondary | ICD-10-CM | POA: Diagnosis not present

## 2021-02-20 DIAGNOSIS — Z9889 Other specified postprocedural states: Secondary | ICD-10-CM | POA: Diagnosis not present

## 2021-02-20 DIAGNOSIS — G8252 Quadriplegia, C1-C4 incomplete: Secondary | ICD-10-CM | POA: Diagnosis not present

## 2021-02-20 DIAGNOSIS — R339 Retention of urine, unspecified: Secondary | ICD-10-CM | POA: Diagnosis not present

## 2021-02-20 DIAGNOSIS — Z79899 Other long term (current) drug therapy: Secondary | ICD-10-CM | POA: Diagnosis not present

## 2021-02-20 DIAGNOSIS — R4189 Other symptoms and signs involving cognitive functions and awareness: Secondary | ICD-10-CM | POA: Diagnosis not present

## 2021-02-21 DIAGNOSIS — R339 Retention of urine, unspecified: Secondary | ICD-10-CM | POA: Diagnosis not present

## 2021-02-21 DIAGNOSIS — Z79899 Other long term (current) drug therapy: Secondary | ICD-10-CM | POA: Diagnosis not present

## 2021-02-21 DIAGNOSIS — Z4789 Encounter for other orthopedic aftercare: Secondary | ICD-10-CM | POA: Diagnosis not present

## 2021-02-21 DIAGNOSIS — Z9889 Other specified postprocedural states: Secondary | ICD-10-CM | POA: Diagnosis not present

## 2021-02-21 DIAGNOSIS — G8252 Quadriplegia, C1-C4 incomplete: Secondary | ICD-10-CM | POA: Diagnosis not present

## 2021-02-21 DIAGNOSIS — R4189 Other symptoms and signs involving cognitive functions and awareness: Secondary | ICD-10-CM | POA: Diagnosis not present

## 2021-02-22 DIAGNOSIS — G8252 Quadriplegia, C1-C4 incomplete: Secondary | ICD-10-CM | POA: Diagnosis not present

## 2021-02-23 DIAGNOSIS — Z9889 Other specified postprocedural states: Secondary | ICD-10-CM | POA: Diagnosis not present

## 2021-02-23 DIAGNOSIS — R339 Retention of urine, unspecified: Secondary | ICD-10-CM | POA: Diagnosis not present

## 2021-02-23 DIAGNOSIS — G8252 Quadriplegia, C1-C4 incomplete: Secondary | ICD-10-CM | POA: Diagnosis not present

## 2021-02-23 DIAGNOSIS — Z79899 Other long term (current) drug therapy: Secondary | ICD-10-CM | POA: Diagnosis not present

## 2021-02-23 DIAGNOSIS — R4189 Other symptoms and signs involving cognitive functions and awareness: Secondary | ICD-10-CM | POA: Diagnosis not present

## 2021-02-23 DIAGNOSIS — Z4789 Encounter for other orthopedic aftercare: Secondary | ICD-10-CM | POA: Diagnosis not present

## 2021-02-24 DIAGNOSIS — G8252 Quadriplegia, C1-C4 incomplete: Secondary | ICD-10-CM | POA: Diagnosis not present

## 2021-02-25 DIAGNOSIS — G8252 Quadriplegia, C1-C4 incomplete: Secondary | ICD-10-CM | POA: Diagnosis not present

## 2021-02-26 DIAGNOSIS — G8252 Quadriplegia, C1-C4 incomplete: Secondary | ICD-10-CM | POA: Diagnosis not present

## 2021-02-26 DIAGNOSIS — R4189 Other symptoms and signs involving cognitive functions and awareness: Secondary | ICD-10-CM | POA: Diagnosis not present

## 2021-02-26 DIAGNOSIS — Z9889 Other specified postprocedural states: Secondary | ICD-10-CM | POA: Diagnosis not present

## 2021-02-26 DIAGNOSIS — Z4789 Encounter for other orthopedic aftercare: Secondary | ICD-10-CM | POA: Diagnosis not present

## 2021-02-26 DIAGNOSIS — Z79899 Other long term (current) drug therapy: Secondary | ICD-10-CM | POA: Diagnosis not present

## 2021-02-26 DIAGNOSIS — R339 Retention of urine, unspecified: Secondary | ICD-10-CM | POA: Diagnosis not present

## 2021-02-27 DIAGNOSIS — R4189 Other symptoms and signs involving cognitive functions and awareness: Secondary | ICD-10-CM | POA: Diagnosis not present

## 2021-02-27 DIAGNOSIS — Z4789 Encounter for other orthopedic aftercare: Secondary | ICD-10-CM | POA: Diagnosis not present

## 2021-02-27 DIAGNOSIS — G8252 Quadriplegia, C1-C4 incomplete: Secondary | ICD-10-CM | POA: Diagnosis not present

## 2021-02-27 DIAGNOSIS — R339 Retention of urine, unspecified: Secondary | ICD-10-CM | POA: Diagnosis not present

## 2021-02-27 DIAGNOSIS — Z9889 Other specified postprocedural states: Secondary | ICD-10-CM | POA: Diagnosis not present

## 2021-02-27 DIAGNOSIS — Z79899 Other long term (current) drug therapy: Secondary | ICD-10-CM | POA: Diagnosis not present

## 2021-02-28 DIAGNOSIS — R339 Retention of urine, unspecified: Secondary | ICD-10-CM | POA: Diagnosis not present

## 2021-02-28 DIAGNOSIS — Z9889 Other specified postprocedural states: Secondary | ICD-10-CM | POA: Diagnosis not present

## 2021-02-28 DIAGNOSIS — Z4789 Encounter for other orthopedic aftercare: Secondary | ICD-10-CM | POA: Diagnosis not present

## 2021-02-28 DIAGNOSIS — R4189 Other symptoms and signs involving cognitive functions and awareness: Secondary | ICD-10-CM | POA: Diagnosis not present

## 2021-02-28 DIAGNOSIS — G8252 Quadriplegia, C1-C4 incomplete: Secondary | ICD-10-CM | POA: Diagnosis not present

## 2021-02-28 DIAGNOSIS — Z79899 Other long term (current) drug therapy: Secondary | ICD-10-CM | POA: Diagnosis not present

## 2021-03-01 DIAGNOSIS — Z79899 Other long term (current) drug therapy: Secondary | ICD-10-CM | POA: Diagnosis not present

## 2021-03-01 DIAGNOSIS — Z9889 Other specified postprocedural states: Secondary | ICD-10-CM | POA: Diagnosis not present

## 2021-03-01 DIAGNOSIS — R4189 Other symptoms and signs involving cognitive functions and awareness: Secondary | ICD-10-CM | POA: Diagnosis not present

## 2021-03-01 DIAGNOSIS — G8252 Quadriplegia, C1-C4 incomplete: Secondary | ICD-10-CM | POA: Diagnosis not present

## 2021-03-01 DIAGNOSIS — Z4789 Encounter for other orthopedic aftercare: Secondary | ICD-10-CM | POA: Diagnosis not present

## 2021-03-01 DIAGNOSIS — R339 Retention of urine, unspecified: Secondary | ICD-10-CM | POA: Diagnosis not present

## 2021-03-02 DIAGNOSIS — R4189 Other symptoms and signs involving cognitive functions and awareness: Secondary | ICD-10-CM | POA: Diagnosis not present

## 2021-03-02 DIAGNOSIS — Z9889 Other specified postprocedural states: Secondary | ICD-10-CM | POA: Diagnosis not present

## 2021-03-02 DIAGNOSIS — Z4789 Encounter for other orthopedic aftercare: Secondary | ICD-10-CM | POA: Diagnosis not present

## 2021-03-02 DIAGNOSIS — Z79899 Other long term (current) drug therapy: Secondary | ICD-10-CM | POA: Diagnosis not present

## 2021-03-02 DIAGNOSIS — R339 Retention of urine, unspecified: Secondary | ICD-10-CM | POA: Diagnosis not present

## 2021-03-02 DIAGNOSIS — G8252 Quadriplegia, C1-C4 incomplete: Secondary | ICD-10-CM | POA: Diagnosis not present

## 2021-03-03 DIAGNOSIS — G8252 Quadriplegia, C1-C4 incomplete: Secondary | ICD-10-CM | POA: Diagnosis not present

## 2021-03-04 DIAGNOSIS — G8252 Quadriplegia, C1-C4 incomplete: Secondary | ICD-10-CM | POA: Diagnosis not present

## 2021-03-04 DIAGNOSIS — Z419 Encounter for procedure for purposes other than remedying health state, unspecified: Secondary | ICD-10-CM | POA: Diagnosis not present

## 2021-03-05 DIAGNOSIS — G8252 Quadriplegia, C1-C4 incomplete: Secondary | ICD-10-CM | POA: Diagnosis not present

## 2021-03-06 DIAGNOSIS — G8252 Quadriplegia, C1-C4 incomplete: Secondary | ICD-10-CM | POA: Diagnosis not present

## 2021-03-07 DIAGNOSIS — G8252 Quadriplegia, C1-C4 incomplete: Secondary | ICD-10-CM | POA: Diagnosis not present

## 2021-03-08 DIAGNOSIS — G8252 Quadriplegia, C1-C4 incomplete: Secondary | ICD-10-CM | POA: Diagnosis not present

## 2021-03-09 DIAGNOSIS — G8252 Quadriplegia, C1-C4 incomplete: Secondary | ICD-10-CM | POA: Diagnosis not present

## 2021-03-10 DIAGNOSIS — G8252 Quadriplegia, C1-C4 incomplete: Secondary | ICD-10-CM | POA: Diagnosis not present

## 2021-03-11 DIAGNOSIS — G8252 Quadriplegia, C1-C4 incomplete: Secondary | ICD-10-CM | POA: Diagnosis not present

## 2021-03-12 DIAGNOSIS — G8252 Quadriplegia, C1-C4 incomplete: Secondary | ICD-10-CM | POA: Diagnosis not present

## 2021-03-13 DIAGNOSIS — G8252 Quadriplegia, C1-C4 incomplete: Secondary | ICD-10-CM | POA: Diagnosis not present

## 2021-03-14 DIAGNOSIS — R339 Retention of urine, unspecified: Secondary | ICD-10-CM | POA: Diagnosis not present

## 2021-03-14 DIAGNOSIS — Z4789 Encounter for other orthopedic aftercare: Secondary | ICD-10-CM | POA: Diagnosis not present

## 2021-03-14 DIAGNOSIS — Z79899 Other long term (current) drug therapy: Secondary | ICD-10-CM | POA: Diagnosis not present

## 2021-03-14 DIAGNOSIS — Z9889 Other specified postprocedural states: Secondary | ICD-10-CM | POA: Diagnosis not present

## 2021-03-14 DIAGNOSIS — R4189 Other symptoms and signs involving cognitive functions and awareness: Secondary | ICD-10-CM | POA: Diagnosis not present

## 2021-03-14 DIAGNOSIS — G8252 Quadriplegia, C1-C4 incomplete: Secondary | ICD-10-CM | POA: Diagnosis not present

## 2021-03-15 DIAGNOSIS — G8252 Quadriplegia, C1-C4 incomplete: Secondary | ICD-10-CM | POA: Diagnosis not present

## 2021-03-16 DIAGNOSIS — G8252 Quadriplegia, C1-C4 incomplete: Secondary | ICD-10-CM | POA: Diagnosis not present

## 2021-03-17 DIAGNOSIS — G8252 Quadriplegia, C1-C4 incomplete: Secondary | ICD-10-CM | POA: Diagnosis not present

## 2021-03-18 DIAGNOSIS — G8252 Quadriplegia, C1-C4 incomplete: Secondary | ICD-10-CM | POA: Diagnosis not present

## 2021-03-19 DIAGNOSIS — C720 Malignant neoplasm of spinal cord: Secondary | ICD-10-CM | POA: Diagnosis not present

## 2021-03-19 DIAGNOSIS — Z79899 Other long term (current) drug therapy: Secondary | ICD-10-CM | POA: Diagnosis not present

## 2021-03-19 DIAGNOSIS — Z9889 Other specified postprocedural states: Secondary | ICD-10-CM | POA: Diagnosis not present

## 2021-03-19 DIAGNOSIS — Z7689 Persons encountering health services in other specified circumstances: Secondary | ICD-10-CM | POA: Diagnosis not present

## 2021-03-19 DIAGNOSIS — G959 Disease of spinal cord, unspecified: Secondary | ICD-10-CM | POA: Diagnosis not present

## 2021-03-19 DIAGNOSIS — Z4789 Encounter for other orthopedic aftercare: Secondary | ICD-10-CM | POA: Diagnosis not present

## 2021-03-19 DIAGNOSIS — R4189 Other symptoms and signs involving cognitive functions and awareness: Secondary | ICD-10-CM | POA: Diagnosis not present

## 2021-03-19 DIAGNOSIS — G8252 Quadriplegia, C1-C4 incomplete: Secondary | ICD-10-CM | POA: Diagnosis not present

## 2021-03-19 DIAGNOSIS — R339 Retention of urine, unspecified: Secondary | ICD-10-CM | POA: Diagnosis not present

## 2021-03-20 DIAGNOSIS — G8252 Quadriplegia, C1-C4 incomplete: Secondary | ICD-10-CM | POA: Diagnosis not present

## 2021-03-20 DIAGNOSIS — C720 Malignant neoplasm of spinal cord: Secondary | ICD-10-CM | POA: Diagnosis not present

## 2021-03-21 DIAGNOSIS — Z4789 Encounter for other orthopedic aftercare: Secondary | ICD-10-CM | POA: Diagnosis not present

## 2021-03-21 DIAGNOSIS — R4189 Other symptoms and signs involving cognitive functions and awareness: Secondary | ICD-10-CM | POA: Diagnosis not present

## 2021-03-21 DIAGNOSIS — G8252 Quadriplegia, C1-C4 incomplete: Secondary | ICD-10-CM | POA: Diagnosis not present

## 2021-03-21 DIAGNOSIS — Z79899 Other long term (current) drug therapy: Secondary | ICD-10-CM | POA: Diagnosis not present

## 2021-03-21 DIAGNOSIS — R339 Retention of urine, unspecified: Secondary | ICD-10-CM | POA: Diagnosis not present

## 2021-03-21 DIAGNOSIS — Z9889 Other specified postprocedural states: Secondary | ICD-10-CM | POA: Diagnosis not present

## 2021-03-22 DIAGNOSIS — G8252 Quadriplegia, C1-C4 incomplete: Secondary | ICD-10-CM | POA: Diagnosis not present

## 2021-03-23 DIAGNOSIS — C720 Malignant neoplasm of spinal cord: Secondary | ICD-10-CM | POA: Diagnosis not present

## 2021-03-23 DIAGNOSIS — G8252 Quadriplegia, C1-C4 incomplete: Secondary | ICD-10-CM | POA: Diagnosis not present

## 2021-03-23 DIAGNOSIS — R260 Ataxic gait: Secondary | ICD-10-CM | POA: Diagnosis not present

## 2021-03-24 DIAGNOSIS — G8252 Quadriplegia, C1-C4 incomplete: Secondary | ICD-10-CM | POA: Diagnosis not present

## 2021-03-25 DIAGNOSIS — G8252 Quadriplegia, C1-C4 incomplete: Secondary | ICD-10-CM | POA: Diagnosis not present

## 2021-03-26 DIAGNOSIS — G8252 Quadriplegia, C1-C4 incomplete: Secondary | ICD-10-CM | POA: Diagnosis not present

## 2021-03-27 DIAGNOSIS — G8252 Quadriplegia, C1-C4 incomplete: Secondary | ICD-10-CM | POA: Diagnosis not present

## 2021-03-28 DIAGNOSIS — G8252 Quadriplegia, C1-C4 incomplete: Secondary | ICD-10-CM | POA: Diagnosis not present

## 2021-03-29 DIAGNOSIS — G8252 Quadriplegia, C1-C4 incomplete: Secondary | ICD-10-CM | POA: Diagnosis not present

## 2021-03-30 DIAGNOSIS — R339 Retention of urine, unspecified: Secondary | ICD-10-CM | POA: Diagnosis not present

## 2021-03-30 DIAGNOSIS — Z4789 Encounter for other orthopedic aftercare: Secondary | ICD-10-CM | POA: Diagnosis not present

## 2021-03-30 DIAGNOSIS — G8252 Quadriplegia, C1-C4 incomplete: Secondary | ICD-10-CM | POA: Diagnosis not present

## 2021-03-30 DIAGNOSIS — Z9889 Other specified postprocedural states: Secondary | ICD-10-CM | POA: Diagnosis not present

## 2021-03-30 DIAGNOSIS — Z79899 Other long term (current) drug therapy: Secondary | ICD-10-CM | POA: Diagnosis not present

## 2021-03-30 DIAGNOSIS — R4189 Other symptoms and signs involving cognitive functions and awareness: Secondary | ICD-10-CM | POA: Diagnosis not present

## 2021-03-31 DIAGNOSIS — G8252 Quadriplegia, C1-C4 incomplete: Secondary | ICD-10-CM | POA: Diagnosis not present

## 2021-04-01 DIAGNOSIS — G8252 Quadriplegia, C1-C4 incomplete: Secondary | ICD-10-CM | POA: Diagnosis not present

## 2021-04-02 DIAGNOSIS — G8252 Quadriplegia, C1-C4 incomplete: Secondary | ICD-10-CM | POA: Diagnosis not present

## 2021-04-03 DIAGNOSIS — G8252 Quadriplegia, C1-C4 incomplete: Secondary | ICD-10-CM | POA: Diagnosis not present

## 2021-04-04 DIAGNOSIS — G8252 Quadriplegia, C1-C4 incomplete: Secondary | ICD-10-CM | POA: Diagnosis not present

## 2021-04-04 DIAGNOSIS — Z419 Encounter for procedure for purposes other than remedying health state, unspecified: Secondary | ICD-10-CM | POA: Diagnosis not present

## 2021-04-05 DIAGNOSIS — G8252 Quadriplegia, C1-C4 incomplete: Secondary | ICD-10-CM | POA: Diagnosis not present

## 2021-04-06 DIAGNOSIS — G8252 Quadriplegia, C1-C4 incomplete: Secondary | ICD-10-CM | POA: Diagnosis not present

## 2021-04-07 DIAGNOSIS — G8252 Quadriplegia, C1-C4 incomplete: Secondary | ICD-10-CM | POA: Diagnosis not present

## 2021-04-08 DIAGNOSIS — G8252 Quadriplegia, C1-C4 incomplete: Secondary | ICD-10-CM | POA: Diagnosis not present

## 2021-04-10 DIAGNOSIS — G8252 Quadriplegia, C1-C4 incomplete: Secondary | ICD-10-CM | POA: Diagnosis not present

## 2021-04-11 DIAGNOSIS — G8252 Quadriplegia, C1-C4 incomplete: Secondary | ICD-10-CM | POA: Diagnosis not present

## 2021-04-12 DIAGNOSIS — G8252 Quadriplegia, C1-C4 incomplete: Secondary | ICD-10-CM | POA: Diagnosis not present

## 2021-04-13 DIAGNOSIS — G8252 Quadriplegia, C1-C4 incomplete: Secondary | ICD-10-CM | POA: Diagnosis not present

## 2021-04-14 DIAGNOSIS — G8252 Quadriplegia, C1-C4 incomplete: Secondary | ICD-10-CM | POA: Diagnosis not present

## 2021-04-15 DIAGNOSIS — G8252 Quadriplegia, C1-C4 incomplete: Secondary | ICD-10-CM | POA: Diagnosis not present

## 2021-04-16 DIAGNOSIS — G8252 Quadriplegia, C1-C4 incomplete: Secondary | ICD-10-CM | POA: Diagnosis not present

## 2021-04-17 DIAGNOSIS — G8252 Quadriplegia, C1-C4 incomplete: Secondary | ICD-10-CM | POA: Diagnosis not present

## 2021-04-18 DIAGNOSIS — G8252 Quadriplegia, C1-C4 incomplete: Secondary | ICD-10-CM | POA: Diagnosis not present

## 2021-04-19 DIAGNOSIS — G8252 Quadriplegia, C1-C4 incomplete: Secondary | ICD-10-CM | POA: Diagnosis not present

## 2021-04-20 DIAGNOSIS — G8252 Quadriplegia, C1-C4 incomplete: Secondary | ICD-10-CM | POA: Diagnosis not present

## 2021-04-21 DIAGNOSIS — G8252 Quadriplegia, C1-C4 incomplete: Secondary | ICD-10-CM | POA: Diagnosis not present

## 2021-04-23 DIAGNOSIS — G8252 Quadriplegia, C1-C4 incomplete: Secondary | ICD-10-CM | POA: Diagnosis not present

## 2021-04-24 DIAGNOSIS — G8252 Quadriplegia, C1-C4 incomplete: Secondary | ICD-10-CM | POA: Diagnosis not present

## 2021-04-25 DIAGNOSIS — Z7689 Persons encountering health services in other specified circumstances: Secondary | ICD-10-CM | POA: Diagnosis not present

## 2021-04-25 DIAGNOSIS — C720 Malignant neoplasm of spinal cord: Secondary | ICD-10-CM | POA: Diagnosis not present

## 2021-04-25 DIAGNOSIS — G8252 Quadriplegia, C1-C4 incomplete: Secondary | ICD-10-CM | POA: Diagnosis not present

## 2021-04-26 DIAGNOSIS — G8252 Quadriplegia, C1-C4 incomplete: Secondary | ICD-10-CM | POA: Diagnosis not present

## 2021-04-27 DIAGNOSIS — G8252 Quadriplegia, C1-C4 incomplete: Secondary | ICD-10-CM | POA: Diagnosis not present

## 2021-04-28 DIAGNOSIS — G8252 Quadriplegia, C1-C4 incomplete: Secondary | ICD-10-CM | POA: Diagnosis not present

## 2021-04-30 DIAGNOSIS — G959 Disease of spinal cord, unspecified: Secondary | ICD-10-CM | POA: Diagnosis not present

## 2021-04-30 DIAGNOSIS — G8252 Quadriplegia, C1-C4 incomplete: Secondary | ICD-10-CM | POA: Diagnosis not present

## 2021-04-30 DIAGNOSIS — C720 Malignant neoplasm of spinal cord: Secondary | ICD-10-CM | POA: Diagnosis not present

## 2021-05-01 DIAGNOSIS — G8252 Quadriplegia, C1-C4 incomplete: Secondary | ICD-10-CM | POA: Diagnosis not present

## 2021-05-03 DIAGNOSIS — G8252 Quadriplegia, C1-C4 incomplete: Secondary | ICD-10-CM | POA: Diagnosis not present

## 2021-05-04 DIAGNOSIS — Z419 Encounter for procedure for purposes other than remedying health state, unspecified: Secondary | ICD-10-CM | POA: Diagnosis not present

## 2021-05-04 DIAGNOSIS — G8252 Quadriplegia, C1-C4 incomplete: Secondary | ICD-10-CM | POA: Diagnosis not present

## 2021-05-05 DIAGNOSIS — R2689 Other abnormalities of gait and mobility: Secondary | ICD-10-CM | POA: Diagnosis not present

## 2021-05-05 DIAGNOSIS — G8252 Quadriplegia, C1-C4 incomplete: Secondary | ICD-10-CM | POA: Diagnosis not present

## 2021-05-06 DIAGNOSIS — G8252 Quadriplegia, C1-C4 incomplete: Secondary | ICD-10-CM | POA: Diagnosis not present

## 2021-05-07 DIAGNOSIS — G8252 Quadriplegia, C1-C4 incomplete: Secondary | ICD-10-CM | POA: Diagnosis not present

## 2021-05-08 DIAGNOSIS — G8252 Quadriplegia, C1-C4 incomplete: Secondary | ICD-10-CM | POA: Diagnosis not present

## 2021-05-09 DIAGNOSIS — G8252 Quadriplegia, C1-C4 incomplete: Secondary | ICD-10-CM | POA: Diagnosis not present

## 2021-05-10 DIAGNOSIS — G8252 Quadriplegia, C1-C4 incomplete: Secondary | ICD-10-CM | POA: Diagnosis not present

## 2021-05-11 DIAGNOSIS — G8252 Quadriplegia, C1-C4 incomplete: Secondary | ICD-10-CM | POA: Diagnosis not present

## 2021-05-12 DIAGNOSIS — G8252 Quadriplegia, C1-C4 incomplete: Secondary | ICD-10-CM | POA: Diagnosis not present

## 2021-05-13 DIAGNOSIS — G8252 Quadriplegia, C1-C4 incomplete: Secondary | ICD-10-CM | POA: Diagnosis not present

## 2021-05-14 DIAGNOSIS — G8252 Quadriplegia, C1-C4 incomplete: Secondary | ICD-10-CM | POA: Diagnosis not present

## 2021-05-15 DIAGNOSIS — G8252 Quadriplegia, C1-C4 incomplete: Secondary | ICD-10-CM | POA: Diagnosis not present

## 2021-05-16 DIAGNOSIS — G8252 Quadriplegia, C1-C4 incomplete: Secondary | ICD-10-CM | POA: Diagnosis not present

## 2021-05-17 DIAGNOSIS — G8252 Quadriplegia, C1-C4 incomplete: Secondary | ICD-10-CM | POA: Diagnosis not present

## 2021-05-18 DIAGNOSIS — G8252 Quadriplegia, C1-C4 incomplete: Secondary | ICD-10-CM | POA: Diagnosis not present

## 2021-05-19 DIAGNOSIS — G8252 Quadriplegia, C1-C4 incomplete: Secondary | ICD-10-CM | POA: Diagnosis not present

## 2021-05-20 DIAGNOSIS — G8252 Quadriplegia, C1-C4 incomplete: Secondary | ICD-10-CM | POA: Diagnosis not present

## 2021-05-21 DIAGNOSIS — G8252 Quadriplegia, C1-C4 incomplete: Secondary | ICD-10-CM | POA: Diagnosis not present

## 2021-05-22 DIAGNOSIS — L89151 Pressure ulcer of sacral region, stage 1: Secondary | ICD-10-CM | POA: Diagnosis not present

## 2021-05-22 DIAGNOSIS — G8252 Quadriplegia, C1-C4 incomplete: Secondary | ICD-10-CM | POA: Diagnosis not present

## 2021-05-23 DIAGNOSIS — G8252 Quadriplegia, C1-C4 incomplete: Secondary | ICD-10-CM | POA: Diagnosis not present

## 2021-05-24 DIAGNOSIS — G8252 Quadriplegia, C1-C4 incomplete: Secondary | ICD-10-CM | POA: Diagnosis not present

## 2021-05-25 DIAGNOSIS — G8252 Quadriplegia, C1-C4 incomplete: Secondary | ICD-10-CM | POA: Diagnosis not present

## 2021-05-26 DIAGNOSIS — G8252 Quadriplegia, C1-C4 incomplete: Secondary | ICD-10-CM | POA: Diagnosis not present

## 2021-05-27 DIAGNOSIS — G8252 Quadriplegia, C1-C4 incomplete: Secondary | ICD-10-CM | POA: Diagnosis not present

## 2021-05-28 DIAGNOSIS — G8252 Quadriplegia, C1-C4 incomplete: Secondary | ICD-10-CM | POA: Diagnosis not present

## 2021-05-29 ENCOUNTER — Ambulatory Visit: Payer: Medicaid Other

## 2021-05-29 DIAGNOSIS — G8252 Quadriplegia, C1-C4 incomplete: Secondary | ICD-10-CM | POA: Diagnosis not present

## 2021-05-30 DIAGNOSIS — G8252 Quadriplegia, C1-C4 incomplete: Secondary | ICD-10-CM | POA: Diagnosis not present

## 2021-05-31 DIAGNOSIS — G8252 Quadriplegia, C1-C4 incomplete: Secondary | ICD-10-CM | POA: Diagnosis not present

## 2021-06-01 DIAGNOSIS — G8252 Quadriplegia, C1-C4 incomplete: Secondary | ICD-10-CM | POA: Diagnosis not present

## 2021-06-02 DIAGNOSIS — G8252 Quadriplegia, C1-C4 incomplete: Secondary | ICD-10-CM | POA: Diagnosis not present

## 2021-06-03 DIAGNOSIS — G8252 Quadriplegia, C1-C4 incomplete: Secondary | ICD-10-CM | POA: Diagnosis not present

## 2021-06-04 DIAGNOSIS — Z419 Encounter for procedure for purposes other than remedying health state, unspecified: Secondary | ICD-10-CM | POA: Diagnosis not present

## 2021-06-05 DIAGNOSIS — R2689 Other abnormalities of gait and mobility: Secondary | ICD-10-CM | POA: Diagnosis not present

## 2021-06-05 DIAGNOSIS — G8252 Quadriplegia, C1-C4 incomplete: Secondary | ICD-10-CM | POA: Diagnosis not present

## 2021-06-06 ENCOUNTER — Ambulatory Visit: Payer: Medicaid Other | Attending: Neurology | Admitting: Physical Therapy

## 2021-06-06 ENCOUNTER — Other Ambulatory Visit: Payer: Self-pay

## 2021-06-06 ENCOUNTER — Encounter: Payer: Self-pay | Admitting: Physical Therapy

## 2021-06-06 DIAGNOSIS — R262 Difficulty in walking, not elsewhere classified: Secondary | ICD-10-CM | POA: Insufficient documentation

## 2021-06-06 DIAGNOSIS — Z7689 Persons encountering health services in other specified circumstances: Secondary | ICD-10-CM | POA: Diagnosis not present

## 2021-06-06 DIAGNOSIS — R29818 Other symptoms and signs involving the nervous system: Secondary | ICD-10-CM

## 2021-06-06 DIAGNOSIS — R293 Abnormal posture: Secondary | ICD-10-CM | POA: Insufficient documentation

## 2021-06-06 DIAGNOSIS — M6281 Muscle weakness (generalized): Secondary | ICD-10-CM | POA: Insufficient documentation

## 2021-06-06 DIAGNOSIS — G8252 Quadriplegia, C1-C4 incomplete: Secondary | ICD-10-CM | POA: Diagnosis not present

## 2021-06-06 NOTE — Therapy (Addendum)
[name] [address] [place], [place], [address] Phone: [phone]   Fax:  [phone]  Physical Therapy Evaluation  Patient Details  Name: Carlos [name] MRN: [id number] Date of Birth: 1965-11-22 Referring Provider (PT): Tommas Olp, MD    06/07/21 1219  PT Visits / Re-Eval  Visit Number 1  Number of Visits 19  Date for PT Re-Evaluation 08/30/21  Authorization  Authorization Type [employer]  PT Time Calculation  PT Start Time 1318  PT Stop Time 1408  PT Time Calculation (min) 50 min  PT - End of Session  Equipment Utilized During Treatment Gait belt  Activity Tolerance Patient tolerated treatment well  Behavior During Therapy [facility] for tasks assessed/performed     Encounter Date: 06/06/2021    Past Medical History:  Diagnosis Date   Crush injury to hand 12/2018   bilateral hands with multipe fractures   Finger osteomyelitis, 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    Subjective Assessment - 06/06/21 1324     Subjective Had posterior C3-C7 laminectomies for excision of intradural intramedullary mass on 11/23/2020. tumor was discovered during work-up for an approximate 1 year history of bilateral leg and hand numbness and weakness as well as stiff and unsteady gait disturbance. Received inpatient therapy at [facility] until april (for ~3 months). Was working on standing and was walking with a special walker/overhead harness. Had home health for about 2 months and feels like he has regressed. Getting botox this month. Has had power w/c  delivered about 2 months ago, reports he has not been able to get over to his chair to use it. CNA transfers is performing a max/total A transfer and picking pt up. Not using sliding board to transfer. Had a sacral pressure sore but it is healing and doing better. Staying most of the day in bed. Using manual w/c.    Patient is accompained by: Family member   mom, patricia   Pertinent History Had posterior C3-C7 laminectomies for excision of intradural intramedullary mass on 11/23/2020. 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    Currently in Pain? No/denies                [facility] PT Assessment - 06/06/21 1337       Assessment   Medical Diagnosis spinal cord ependymoma    Referring Provider (PT) Tommas Olp, MD    Onset Date/Surgical Date 11/23/20    Hand Dominance Left   used to be L, now R   Prior Therapy previous OT/PT at [facility] inpatient, HH      Precautions   Precautions Fall      Balance Screen   Has the patient fallen in the past 6 months No    Has the patient had a decrease in activity level because of a fear of falling?  Yes    Is the patient reluctant to leave their home because of a fear  of falling?  No      Home Environment   Living Environment Private residence    Living Arrangements Other (Comment)   son and daughter   Available Help at Discharge Family;Other (Comment)   CNA   Home Access Level entry   little incline   Home Layout One level    Home Equipment Wheelchair - power;Wheelchair - manual;Hospital bed;Bedside commode;Walker - standard    Additional Comments CNA tries to come everyday - helping with all bathing and dressing and transfers, kids help with meal prep bladder programs and changing positions in bed. 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    Leisure used to teach photography classes, fishing, hiking      Observation/Other Assessments   Observations R hand contractures      Sensation   Light Touch Impaired Detail    Light Touch Impaired Details Impaired RUE;Impaired LUE;Impaired RLE;Impaired LLE    Proprioception Appears Intact   at B ankles   Additional Comments difficulty detecting light touch BLE more proximally, can't detect anything in B hands      Coordination   Gross Motor Movements are Fluid and Coordinated No    Heel Shin Test due to weakness      Posture/Postural Control   Posture/Postural Control Postural limitations    Postural Limitations Posterior pelvic tilt;Forward head;Rounded Shoulders      Tone   Assessment Location Right Upper Extremity;Left Upper Extremity      ROM / Strength   AROM / PROM / Strength Strength;AROM      AROM   Overall AROM Comments decr BLE AROM due to strength deficits, decr functional use of BUE due to spasticity LUE>RUE      Strength   Strength Assessment Site Hip;Ankle;Knee    Right/Left Hip Right;Left    Right Hip Flexion 3-/5    Left Hip Flexion 3-/5    Right/Left Knee Left;Right    Right Knee Flexion 4+/5    Right Knee Extension 2+/5    Left Knee Flexion 4-/5    Left Knee Extension 3-/5    Right/Left Ankle Right;Left    Right Ankle Dorsiflexion 4+/5    Left Ankle Dorsiflexion 4-/5      Bed Mobility   Bed Mobility Not assessed   did not have time to assess at eval, pt reports his children help him at times with positioning in the bed     Transfers   Transfers Lateral/Scoot Transfers    Lateral/Scoot Transfers 2: Max assist    Lateral/Scoot Transfer Details (indicate cue type and reason) to and from mat with PT tech posteriorly, pt able to assist with RUE on w/c to help push towards mat, cues for head hips relationship.unable to use LUE to assist with transfer. last time pt performed slide board  transfers was at [facility] inpatient rehab (from feb-april). pt's mom helping to steady w/c      Balance   Balance Assessed Yes      Static Sitting Balance   Static Sitting - Balance Support No upper extremity supported;Feet supported;Bilateral upper extremity supported    Static Sitting - Level of Assistance 5: Stand by assistance    Static Sitting - Comment/# of Minutes approx 5 minutes, pt reporting it felt good to sit as pt spends the majority of his day in his  w/c      RUE Tone   RUE Tone Hypertonic      LUE Tone   LUE Tone Hypertonic   LUE>RUE               [employer] Authorization   Choose one: Neuro Rehabilitative  Standardized Assessment or Functional Outcome Tool: See Pain Assessment and N/A   Body Parts Treated (Select each separately):  Knee. Overall deficits/functional limitations for body part selected: severe Lumbopelvic. Overall deficits/functional limitations for body part selected: severe Other trunk/BLE/and BUE s/p excision of tumor in spinal cord limiting functional mobility and tranfsers . Overall deficits/functional limitations for body part selected: severe         Objective measurements completed on examination: See above findings.               PT Education - 06/06/21 1421     Education Details clinical findings, POC, areas to work on in PT, [place] visit limit between PT/OT, PT to send referral request to pt's MD for OT order for BUE deficits    Person(s) Educated Patient    Methods Explanation    Comprehension Verbalized understanding             PT Short Term Goals - 06/14/21 1652       PT SHORT TERM GOAL #1   Title Pt and pt's family will be independent with initial HEP for ROM/strengthening in order to build upon functional gains made in therapy. ALL STGS DUE 9/122    Baseline currently dependent    Time 4    Period Weeks    Status New    Target Date 07/05/21      PT SHORT TERM GOAL #2   Title Pt will be  able to perform sliding board transfer from w/c <> mat table with mod A in order to demo improved functional transfers to decr caregiver burden and transfer into his power w/c.    Baseline at home pt is transferred by CNA with total A, unable to get into power w/c.    Time 4    Period Weeks    Status New      PT SHORT TERM GOAL #3   Title Bed mobility to further be assessed with STG/LTG to be written.    Baseline not yet assessed.    Time 4    Period Weeks    Status New      PT SHORT TERM GOAL #4   Title Pt will perform dynamic sitting tasks for at least 5 minutes with supervision in order to demo improved sitting tolerance for ADLs.    Baseline not yet formally assessed.    Time 4    Period Weeks    Status New      PT SHORT TERM GOAL #5   Title Pt will stand in standing frame x1 minute with mod A in order to demo functional weight bearing.    Baseline pt has not stood since he finished inpatient rehab at [facility] in april 2022    Time 4    Period Weeks    Status New             PT Long Term Goals - 06/14/21 1656       PT LONG TERM GOAL #1   Title Bed mobility goal to be written as appropriate. ALL LTGS DUE 08/02/21    Baseline did not have time to perform at eval    Time 8  Period Weeks    Status New    Target Date 08/02/21      PT LONG TERM GOAL #2   Title Pt will perform sliding board transfers with min guard in order to demo improved functional transfers.    Baseline max A currently needed    Time 8    Period Weeks    Status New      PT LONG TERM GOAL #3   Title Pt will perform stand pivot transfers with mod A in order to demo improved functional mobility.    Baseline not yet assessed.    Time 8    Period Weeks    Status New      PT LONG TERM GOAL #4   Title Pt will be independent with mobility around obstacles and outdoor unlevel surfaces with power w/c in order to demo improved independence.    Baseline pt has not transferred into his power w/c yet  for use.    Time 8    Period Weeks    Status New      PT LONG TERM GOAL #5   Title Pt will stand at [facility] with min/mod A for 1 minute in order to demo improved standing tolerance.    Baseline not yet assessed.    Time 8    Period Weeks    Status New                  06/14/21 1622  Plan  Clinical Impression Statement Patient is a 55 year old male referred to Neuro OPPT for spinal cord ependymoma. Had posterior C3-C7 laminectomies for excision of intradural intramedullary mass on 11/23/2020.  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 Pt received inpatient rehab at [facility] for ~3 months and was able to do some standing/walking with overhead harness. Pt received some HHPT and reports was not doing any standing. Has had a power w/c delivered about 2 months ago, but has not been able to transfer over to it. All transfers are performed by his CNA with total A to get him into a manual chair, spends the majority of the day in bed currently. The following deficits were present during the exam: decr strength, decr endurance, impaired tone, impaired sensation, R hand contractures, decr trunk control/postural abnormalities, impaired balance, decr ROM, decr ability for functional transfers. Pt needing max A for sliding board transfer from w/c <> mat table. Pt would benefit from skilled PT to address these impairments and functional limitations to maximize functional mobility independence  Personal Factors and Comorbidities Comorbidity 3+;Time since onset of injury/illness/exacerbation;Past/Current Experience;Behavior Pattern  Comorbidities excision of intradural intramedullary mass on 11/23/2020. 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  Examination-Activity Limitations Bathing;Bed Mobility;Caring for Others;Locomotion Level;Squat;Dressing;Reach  Overhead;Stand;Toileting;Transfers;Hygiene/Grooming;Lift  Examination-Participation Restrictions Cleaning;Community Activity;Laundry;Occupation;Driving;Meal Prep  Pt will benefit from skilled therapeutic intervention in order to improve on the following deficits Decreased activity tolerance;Decreased balance;Decreased coordination;Decreased endurance;Decreased mobility;Decreased range of motion;Difficulty walking;Decreased strength;Hypomobility;Increased muscle spasms;Impaired flexibility;Impaired tone;Impaired sensation;Impaired UE functional use;Postural dysfunction  Stability/Clinical Decision Making Unstable/Unpredictable  Clinical Decision Making High  Rehab Potential Good  PT Frequency 2x / week (1-2x a week for 12 weeks)  PT Duration 12 weeks (1-2x a week for 12 weeks)  PT Treatment/Interventions ADLs/Self Care Home Management;Electrical Stimulation;DME Instruction;Gait training;Functional mobility training;Therapeutic activities;Neuromuscular re-education;Balance training;Therapeutic exercise;Patient/family education;Orthotic Fit/Training;Manual techniques;Passive range of motion;Energy conservation;Vestibular  PT Next Visit Plan assess  bed mobility, initial HEP for ROM/supine and seated strengthening, continue to work on transfers with sliding board  Recommended Other Services OT referral, PT to send referral request to pt's MD  Consulted and Agree with Plan of Care Patient;Family member/caregiver  Family Member Consulted pt's mom           Patient will benefit from skilled therapeutic intervention in order to improve the following deficits and impairments:     Visit Diagnosis: Difficulty in walking, not elsewhere classified  Muscle weakness (generalized)  Abnormal posture  Other symptoms and signs involving the nervous system     Problem List Patient Active Problem List   Diagnosis Date Noted   Finger osteomyelitis, left ([place]) 09/23/2019   Leukocytosis 09/23/2019     Arliss Journey, PT, DPT  06/06/2021, 2:27 PM  [facility] [address] [place], [place], [address] Phone: [phone]   Fax:  [phone]  Name: Fares Mcginity MRN: [id number] Date of Birth: November 09, 1965

## 2021-06-07 ENCOUNTER — Telehealth: Payer: Self-pay | Admitting: Physical Therapy

## 2021-06-07 DIAGNOSIS — G8252 Quadriplegia, C1-C4 incomplete: Secondary | ICD-10-CM | POA: Diagnosis not present

## 2021-06-07 NOTE — Telephone Encounter (Signed)
Faxed as provider is not in Epic:   Dr. Shela Leff,  Carlos [name] was evaluated by Physical Therapy on 06/06/21.  The patient would benefit from an OT evaluation for decr functional BUE use, R hand contracture, incr spasticity, and decr strength s/p resection of intraspinal ependymoma.   If you agree, please place an order in [facility] workque in [facility] or fax the order to [phone]. Thank you, Janann August, PT, DPT 06/07/21 9:27 AM    [facility] [address] [place], [place]  [address] Phone:  [phone] Fax:  [phone]

## 2021-06-08 DIAGNOSIS — G8252 Quadriplegia, C1-C4 incomplete: Secondary | ICD-10-CM | POA: Diagnosis not present

## 2021-06-09 DIAGNOSIS — G8252 Quadriplegia, C1-C4 incomplete: Secondary | ICD-10-CM | POA: Diagnosis not present

## 2021-06-10 DIAGNOSIS — G8252 Quadriplegia, C1-C4 incomplete: Secondary | ICD-10-CM | POA: Diagnosis not present

## 2021-06-11 DIAGNOSIS — G8252 Quadriplegia, C1-C4 incomplete: Secondary | ICD-10-CM | POA: Diagnosis not present

## 2021-06-12 DIAGNOSIS — G8252 Quadriplegia, C1-C4 incomplete: Secondary | ICD-10-CM | POA: Diagnosis not present

## 2021-06-13 DIAGNOSIS — G8252 Quadriplegia, C1-C4 incomplete: Secondary | ICD-10-CM | POA: Diagnosis not present

## 2021-06-14 DIAGNOSIS — G8252 Quadriplegia, C1-C4 incomplete: Secondary | ICD-10-CM | POA: Diagnosis not present

## 2021-06-14 NOTE — Addendum Note (Signed)
Addended by: Arliss Journey on: 06/14/2021 05:06 PM   Modules accepted: Orders

## 2021-06-15 ENCOUNTER — Ambulatory Visit: Payer: Medicaid Other | Admitting: Physical Therapy

## 2021-06-15 DIAGNOSIS — G8252 Quadriplegia, C1-C4 incomplete: Secondary | ICD-10-CM | POA: Diagnosis not present

## 2021-06-16 DIAGNOSIS — G8252 Quadriplegia, C1-C4 incomplete: Secondary | ICD-10-CM | POA: Diagnosis not present

## 2021-06-17 DIAGNOSIS — G8252 Quadriplegia, C1-C4 incomplete: Secondary | ICD-10-CM | POA: Diagnosis not present

## 2021-06-18 DIAGNOSIS — G8252 Quadriplegia, C1-C4 incomplete: Secondary | ICD-10-CM | POA: Diagnosis not present

## 2021-06-19 ENCOUNTER — Ambulatory Visit: Payer: Medicaid Other | Admitting: Physical Therapy

## 2021-06-19 DIAGNOSIS — G8252 Quadriplegia, C1-C4 incomplete: Secondary | ICD-10-CM | POA: Diagnosis not present

## 2021-06-20 DIAGNOSIS — G8252 Quadriplegia, C1-C4 incomplete: Secondary | ICD-10-CM | POA: Diagnosis not present

## 2021-06-21 DIAGNOSIS — G8252 Quadriplegia, C1-C4 incomplete: Secondary | ICD-10-CM | POA: Diagnosis not present

## 2021-06-22 DIAGNOSIS — G8252 Quadriplegia, C1-C4 incomplete: Secondary | ICD-10-CM | POA: Diagnosis not present

## 2021-06-23 DIAGNOSIS — G8252 Quadriplegia, C1-C4 incomplete: Secondary | ICD-10-CM | POA: Diagnosis not present

## 2021-06-24 DIAGNOSIS — G8252 Quadriplegia, C1-C4 incomplete: Secondary | ICD-10-CM | POA: Diagnosis not present

## 2021-06-25 DIAGNOSIS — G8252 Quadriplegia, C1-C4 incomplete: Secondary | ICD-10-CM | POA: Diagnosis not present

## 2021-06-26 ENCOUNTER — Ambulatory Visit: Payer: Medicaid Other | Admitting: Physical Therapy

## 2021-06-26 DIAGNOSIS — G8252 Quadriplegia, C1-C4 incomplete: Secondary | ICD-10-CM | POA: Diagnosis not present

## 2021-06-27 DIAGNOSIS — G8252 Quadriplegia, C1-C4 incomplete: Secondary | ICD-10-CM | POA: Diagnosis not present

## 2021-06-28 ENCOUNTER — Ambulatory Visit: Payer: Medicaid Other | Admitting: Physical Therapy

## 2021-06-28 ENCOUNTER — Other Ambulatory Visit: Payer: Self-pay

## 2021-06-28 ENCOUNTER — Encounter: Payer: Self-pay | Admitting: Physical Therapy

## 2021-06-28 DIAGNOSIS — R262 Difficulty in walking, not elsewhere classified: Secondary | ICD-10-CM | POA: Diagnosis not present

## 2021-06-28 DIAGNOSIS — M6281 Muscle weakness (generalized): Secondary | ICD-10-CM

## 2021-06-28 DIAGNOSIS — G8252 Quadriplegia, C1-C4 incomplete: Secondary | ICD-10-CM | POA: Diagnosis not present

## 2021-06-28 DIAGNOSIS — R29818 Other symptoms and signs involving the nervous system: Secondary | ICD-10-CM

## 2021-06-28 DIAGNOSIS — R293 Abnormal posture: Secondary | ICD-10-CM

## 2021-06-28 NOTE — Patient Instructions (Signed)
Access Code: [email or web address] URL: [email or web address] Date: 06/28/2021 Prepared by: Willow Ora  Exercises Supine Bridge with [employer] Between Knees - 1 x daily - 5 x weekly - 1 sets - 10 reps Quad Setting and Stretching - 1 x daily - 5 x weekly - 1 sets - 10 reps - 5 seconds hold Supine Hamstring Stretch with Caregiver - 1 x daily - 5 x weekly - 1 sets - 3 reps - 30 hold Supine Lower Trunk Rotation with Swiss Ball - 1 x daily - 5 x weekly - 1 sets - 10 reps

## 2021-06-29 ENCOUNTER — Other Ambulatory Visit: Payer: Self-pay | Admitting: Obstetrics and Gynecology

## 2021-06-29 DIAGNOSIS — Z79891 Long term (current) use of opiate analgesic: Secondary | ICD-10-CM | POA: Diagnosis not present

## 2021-06-29 DIAGNOSIS — G959 Disease of spinal cord, unspecified: Secondary | ICD-10-CM | POA: Diagnosis not present

## 2021-06-29 DIAGNOSIS — C720 Malignant neoplasm of spinal cord: Secondary | ICD-10-CM | POA: Diagnosis not present

## 2021-06-29 DIAGNOSIS — Z7689 Persons encountering health services in other specified circumstances: Secondary | ICD-10-CM | POA: Diagnosis not present

## 2021-06-29 DIAGNOSIS — G8252 Quadriplegia, C1-C4 incomplete: Secondary | ICD-10-CM | POA: Diagnosis not present

## 2021-06-29 DIAGNOSIS — M2450 Contracture, unspecified joint: Secondary | ICD-10-CM | POA: Diagnosis not present

## 2021-06-29 DIAGNOSIS — R269 Unspecified abnormalities of gait and mobility: Secondary | ICD-10-CM | POA: Diagnosis not present

## 2021-06-29 NOTE — Patient Outreach (Signed)
Care Coordination  06/29/2021  [name] 05/26/1966 [id number]   Medicaid Managed Care   Unsuccessful Outreach Note  06/29/2021 Name: Carlos [name] MRN: [id number] DOB: Feb 16, 1966  Referred by: Patient, No Pcp Per (Inactive) Reason for referral : High Risk Managed Medicaid (Unsuccessful telephone outreach)   An unsuccessful telephone outreach was attempted today. The patient was referred to the case management team for assistance with care management and care coordination.   Follow Up Plan: The care management team will reach out to the patient again over the next 7 days.   Aida Raider RN, BSN [place]  [facility] - Managed Medicaid High Risk [phone].

## 2021-06-29 NOTE — Patient Instructions (Signed)
Visit Information  Mr. Jermaine Biron  - as a part of your Medicaid benefit, you are eligible for care management and care coordination services at no cost or copay. I was unable to reach you by phone today but would be happy to help you with your health related needs. Please feel free to call me at [phone].  A member of the Managed Medicaid care management team will reach out to you again over the next 7 days.   Aida Raider RN, BSN [place]  [facility] - Managed Medicaid High Risk [phone].

## 2021-06-29 NOTE — Therapy (Signed)
[facility] [address] [place], [place], [address] Phone: [phone]   Fax:  [phone]  Physical Therapy Treatment  Patient Details  Name: Carlos [name] MRN: [id number] Date of Birth: [birth year] Referring Provider (PT): Tommas Olp, MD   Encounter Date: 06/28/2021   PT End of Session - 06/28/21 1453     Visit Number 2    Number of Visits 19    Date for PT Re-Evaluation 08/30/21    Authorization Type [employer]-    Authorization Time Period 12 visit from 06/15/21- 08/07/21    Authorization - Visit Number 1    Authorization - Number of Visits 12    PT Start Time [id number]    PT Stop Time [id number]    PT Time Calculation (min) 44 min    Equipment Utilized During Treatment Gait belt;Other (comment)   slide board   Activity Tolerance Patient tolerated treatment well    Behavior During Therapy [facility] for tasks assessed/performed             Past Medical History:  Diagnosis Date   Crush injury to hand 12/2018   bilateral hands with multipe fractures   Finger osteomyelitis, 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   Subjective Assessment - 06/28/21 1452     Subjective No new complaints. No falls or pain to report.    Patient is accompained by: Family member   mom. 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    Currently in Pain? No/denies                   [facility] Adult PT Treatment/Exercise - 06/28/21 1454       Bed Mobility   Bed Mobility Sit to Supine;Supine to Sit    Supine to Sit Moderate Assistance - Patient 50-74%    Supine to Sit Details (indicate cue type and reason) for seated at edge of mat pt demo'd his way at home- lying straight back with slight angle, assist needed to lift LE's onto mat. pt then performs a series of mini bridges to move pelvis to center while moving shoulder toward center with min assist needed at times to straighten out in bed.    Sit to Supine Moderate Assistance - Patient 50-74%    Sit to Supine - Details (indicate cue type and reason) pt bringes his shoulder/trunk away from edge of mat, bridging hips to bring legs close to edge of mat. Pt then lowers legs off edge and with HHA comes up into sitting. attempted to have pt roll onto right side to perform right sidelying to sit however pt fearful of falling off edge, unable to tolerate his top LE (left) coming past neutral positon due to pain (pillow did not help with this), nor rolling onto right shoulder, therefore had him demo how  he would get up. Of note, he did say he can tolerate lying on the left side, so can try left sidelying to sit next time.      Transfers   Transfers Lateral/Scoot Transfers    Lateral/Scoot Transfers 2: Max assist;4: Min assist;From elevated surface;With slide board    Lateral/Scoot Transfer Details (indicate cue type and reason) from wheelchair>mat table going toward right side max assist to initiate/get onto board with second person behind pt for safety. once on the board, mod assist to complete transfer with cues for pt to use right UE to "lift" buttoks up and over, along with bearing down through LE"s (this was level surface transfer). at end of session used slide board to transfer mat>wheelchair high>low surface toward left side with min assist to initiate  onto board, then min guard to min assist for pt to use right UE to push while bearing down through LE's.      Exercises   Exercises Other Exercises    Other Exercises  worked on ex's pt can perform at home with assist if needed. Refer to [place] for full details of ex's issued to HEP this session. Mom present and educated as well.                    PT Short Term Goals - 06/14/21 1652       PT SHORT TERM GOAL #1   Title Pt and pt's family will be independent with initial HEP for ROM/strengthening in order to build upon functional gains made in therapy. ALL STGS DUE 9/122    Baseline currently dependent    Time 4    Period Weeks    Status New    Target Date 07/05/21      PT SHORT TERM GOAL #2   Title Pt will be able to perform sliding board transfer from w/c <> mat table with mod A in order to demo improved functional transfers to decr caregiver burden and transfer into his power w/c.    Baseline at home pt is transferred by CNA with total A, unable to get into power w/c.    Time 4    Period Weeks    Status New      PT SHORT TERM GOAL #3   Title Bed mobility to further be assessed with STG/LTG to be written.    Baseline not yet assessed.    Time 4    Period Weeks    Status New      PT SHORT TERM GOAL #4   Title Pt will perform dynamic sitting tasks for at least 5 minutes with supervision in order to demo improved sitting tolerance for ADLs.    Baseline not yet formally assessed.    Time 4    Period Weeks    Status New      PT SHORT TERM GOAL #5   Title Pt will stand in standing frame x1 minute with mod A in order to demo functional weight bearing.    Baseline pt has not stood since he finished inpatient rehab at [facility] in april 2022    Time 4    Period Weeks    Status New               PT Long Term Goals - 06/14/21 1656       PT LONG TERM GOAL #1   Title Bed mobility goal to be written as appropriate. ALL LTGS DUE 08/02/21    Baseline  did not  have time to perform at eval    Time 8    Period Weeks    Status New    Target Date 08/02/21      PT LONG TERM GOAL #2   Title Pt will perform sliding board transfers with min guard in order to demo improved functional transfers.    Baseline max A currently needed    Time 8    Period Weeks    Status New      PT LONG TERM GOAL #3   Title Pt will perform stand pivot transfers with mod A in order to demo improved functional mobility.    Baseline not yet assessed.    Time 8    Period Weeks    Status New      PT LONG TERM GOAL #4   Title Pt will be independent with mobility around obstacles and outdoor unlevel surfaces with power w/c in order to demo improved independence.    Baseline pt has not transferred into his power w/c yet for use.    Time 8    Period Weeks    Status New      PT LONG TERM GOAL #5   Title Pt will stand at [facility] with min/mod A for 1 minute in order to demo improved standing tolerance.    Baseline not yet assessed.    Time 8    Period Weeks    Status New                   Plan - 06/28/21 1453     Clinical Impression Statement Today's skilled session continued with slide board transfer training. Pt initially reluctant to work on [employer] as he wanted to "get my legs stronger, not my arms". Educated pt that legs are essential to slide board transfers and will allow him to get out of bed at home. Pt then agreed. Needs continued work and practice with this as he only has one UE that can effectively assist with transfers and bil LE's are very weak functionally (pt can lift each leg up activily, however unable to press down into them on the ground)- question if its weakness vs motor planning vs open/closed chain activation issues. Worked on establishing an HEP for stretching/strengthening with most needing caregiver assistance. The pt should benefit from continued PT to progress toward unmet goals.     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    Examination-Activity Limitations Bathing;Bed Mobility;Caring for Others;Locomotion Level;Squat;Dressing;Reach Overhead;Stand;Toileting;Transfers;Hygiene/Grooming;Lift    Examination-Participation Restrictions Cleaning;Community Activity;Laundry;Occupation;Driving;Meal Prep    Stability/Clinical Decision Making Unstable/Unpredictable    Rehab Potential Good    PT Frequency 2x / week   1-2x a week for 12 weeks   PT Duration 12 weeks   1-2x a week for 12 weeks   PT Treatment/Interventions ADLs/Self Care Home Management;Electrical Stimulation;DME Instruction;Gait training;Functional mobility training;Therapeutic activities;Neuromuscular re-education;Balance training;Therapeutic exercise;Patient/family education;Orthotic Fit/Training;Manual techniques;Passive range of motion;Energy conservation;Vestibular    PT Next Visit Plan continue to work on transfers with sliding board, continue to work on supine and seated strengthing.    Consulted and Agree with Plan of Care Patient;Family member/caregiver    Family Member Consulted pt's mom             Patient will benefit from skilled therapeutic intervention  in order to improve the following deficits and impairments:  Decreased activity tolerance, Decreased balance, Decreased coordination, Decreased endurance, Decreased mobility, Decreased range of motion, Difficulty walking, Decreased strength, Hypomobility, Increased muscle spasms, Impaired flexibility, Impaired tone, Impaired sensation, Impaired UE functional use, Postural dysfunction  Visit Diagnosis: Difficulty in walking, not elsewhere classified  Muscle weakness (generalized)  Abnormal posture  Other symptoms and signs involving the  nervous system     Problem List Patient Active Problem List   Diagnosis Date Noted   Finger osteomyelitis, left ([place]) 09/23/2019   Leukocytosis 09/23/2019    Willow Ora, PTA, [facility] [address], [place], [place] [address] [phone] 06/29/21, 3:49 PM   Name: Jery Cerbone MRN: [id number] Date of Birth: [birth year]

## 2021-06-30 DIAGNOSIS — G8252 Quadriplegia, C1-C4 incomplete: Secondary | ICD-10-CM | POA: Diagnosis not present

## 2021-07-01 DIAGNOSIS — G8252 Quadriplegia, C1-C4 incomplete: Secondary | ICD-10-CM | POA: Diagnosis not present

## 2021-07-02 DIAGNOSIS — G8252 Quadriplegia, C1-C4 incomplete: Secondary | ICD-10-CM | POA: Diagnosis not present

## 2021-07-03 ENCOUNTER — Ambulatory Visit: Payer: Medicaid Other | Admitting: Physical Therapy

## 2021-07-03 ENCOUNTER — Other Ambulatory Visit: Payer: Self-pay

## 2021-07-03 ENCOUNTER — Encounter: Payer: Self-pay | Admitting: Physical Therapy

## 2021-07-03 DIAGNOSIS — G8252 Quadriplegia, C1-C4 incomplete: Secondary | ICD-10-CM | POA: Diagnosis not present

## 2021-07-03 DIAGNOSIS — R29818 Other symptoms and signs involving the nervous system: Secondary | ICD-10-CM

## 2021-07-03 DIAGNOSIS — R262 Difficulty in walking, not elsewhere classified: Secondary | ICD-10-CM | POA: Diagnosis not present

## 2021-07-03 DIAGNOSIS — M6281 Muscle weakness (generalized): Secondary | ICD-10-CM | POA: Diagnosis not present

## 2021-07-03 DIAGNOSIS — R293 Abnormal posture: Secondary | ICD-10-CM | POA: Diagnosis not present

## 2021-07-04 DIAGNOSIS — G8252 Quadriplegia, C1-C4 incomplete: Secondary | ICD-10-CM | POA: Diagnosis not present

## 2021-07-04 NOTE — Therapy (Signed)
[facility] [address] [place], [place], [address] Phone: [phone]   Fax:  [phone]  Physical Therapy Treatment  Patient Details  Name: Carlos [name] MRN: [id number] Date of Birth: 1966-07-21 Referring Provider (PT): Tommas Olp, MD   Encounter Date: 07/03/2021   PT End of Session - 07/04/21 2017     Visit Number 3    Number of Visits 19    Date for PT Re-Evaluation 08/30/21    Authorization Type [employer]-    Authorization Time Period 12 visit from 06/15/21- 08/07/21    Authorization - Visit Number 2    Authorization - Number of Visits 12    PT Start Time [id number]    PT Stop Time 1530    PT Time Calculation (min) 45 min    Equipment Utilized During Treatment Other (comment)   Slideboard and SARA plus   Activity Tolerance Patient tolerated treatment well    Behavior During Therapy [facility] for tasks assessed/performed             Past Medical History:  Diagnosis Date   Crush injury to hand 12/2018   bilateral hands with multipe fractures   Finger osteomyelitis, 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   Subjective Assessment - 07/04/21 2030     Subjective Pt arrives in manual wheelchair.  Will be getting Botox injections in LUE.  Asking about Occupational Therapy.    Patient is accompained by: Family member   mom. 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    Currently in Pain? No/denies             Alerted pt and mother that request for OT referral had been sent via Epic and fax and that no order had been received yet.  Provided pt and mother with paper referral request; mother reports she would be going to [place] and could have it signed.  Discussed possibility of starting OT after Botox injections for maximum benefit and to conserve visits.  Performed slideboard transfer w/c > mat with Max A to initiate lateral scoot out of depression in cushion.  Once on board pt able to perform slideboard with mod A and verbal cues for hand placement and use of head-hips relationship to off weight hips.  When returning to wheelchair at end of session with slideboard pt continued to require min-mod A to block LE and for head hips relationship to prevent pt from leaning head first towards wheelchair.  Once in wheelchair pt required max A to reposition hips due to cushion not providing stable base.    On elevated mat performed functional WB and LE strengthening with sit > mini squat with therapist providing verbal, visual and tactile cues for upright posture, anterior pelvic tilt to shift COG over BOS prior to initiating squat.  Performed 5 reps  with pt lifting hips higher with each repetition.  Therapist assisted under hips with mod-max A.  Attempted to perform sit > stand from elevated mat with +2 arm over shoulder support (3 musketeers) but pt unable to tolerate in L shoulder and unable to come to full stand.  Set pt up in [place] plus for UE support and to block knees with therapists x 2 assisting with weight shift forwards and initiating hip and knee extension with machine.  Performed standing x 2 x >2 minutes with pt attempting to perform small squats and terminal knee extension.  Initially pt reporting L shoulder pain but improved with second stand.       PT Education - 07/04/21  2029     Education Details Provided mother with paper OT referral request; Mother to take to [facility] to have it signed.  Discussed scheduling OT for after Botox injections.    Person(s) Educated Patient;Parent(s)    Methods Explanation    Comprehension Verbalized understanding              PT Short Term Goals - 06/14/21 1652       PT SHORT TERM GOAL #1   Title Pt and pt's family will be independent with initial HEP for ROM/strengthening in order to build upon functional gains made in therapy. ALL STGS DUE 9/122    Baseline currently dependent    Time 4    Period Weeks    Status New    Target Date 07/05/21      PT SHORT TERM GOAL #2   Title Pt will be able to perform sliding board transfer from w/c <> mat table with mod A in order to demo improved functional transfers to decr caregiver burden and transfer into his power w/c.    Baseline at home pt is transferred by CNA with total A, unable to get into power w/c.    Time 4    Period Weeks    Status New      PT SHORT TERM GOAL #3   Title Bed mobility to further be assessed with STG/LTG to be written.    Baseline not yet assessed.    Time 4    Period Weeks    Status New      PT SHORT TERM GOAL #4   Title Pt will perform dynamic sitting tasks for at least 5 minutes with supervision in order to demo improved sitting tolerance for ADLs.    Baseline not yet formally assessed.    Time 4    Period Weeks    Status New      PT SHORT TERM GOAL #5   Title Pt will stand in standing frame x1 minute with mod A in order to demo functional weight bearing.    Baseline pt has not stood since he finished inpatient rehab at [facility] in april 2022    Time 4    Period Weeks    Status New               PT Long Term Goals - 06/14/21 1656       PT LONG TERM GOAL #1   Title Bed mobility goal to be written as appropriate. ALL LTGS DUE 08/02/21    Baseline did not have time to perform at eval    Time 8    Period Weeks    Status New     Target Date 08/02/21      PT LONG TERM GOAL #2  Title Pt will perform sliding board transfers with min guard in order to demo improved functional transfers.    Baseline max A currently needed    Time 8    Period Weeks    Status New      PT LONG TERM GOAL #3   Title Pt will perform stand pivot transfers with mod A in order to demo improved functional mobility.    Baseline not yet assessed.    Time 8    Period Weeks    Status New      PT LONG TERM GOAL #4   Title Pt will be independent with mobility around obstacles and outdoor unlevel surfaces with power w/c in order to demo improved independence.    Baseline pt has not transferred into his power w/c yet for use.    Time 8    Period Weeks    Status New      PT LONG TERM GOAL #5   Title Pt will stand at [facility] with min/mod A for 1 minute in order to demo improved standing tolerance.    Baseline not yet assessed.    Time 8    Period Weeks    Status New                   Plan - 07/04/21 2018     Clinical Impression Statement Continued to focus on progressing towards STG - did not assess STG today as this is the patient's second follow up visit.  Continued to focus on increasing patient's postural control, LE strength and mm activation into extension to assist with slideboard transfers and sit > stand.  Unable to stand from elevated mat with +2 assistance but able to perform prolonged standing  for >2 minutes with use of SARA plus and +2 assistance.  Will assess STG at next visit.    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    Examination-Activity Limitations Bathing;Bed Mobility;Caring for Others;Locomotion Level;Squat;Dressing;Reach  Overhead;Stand;Toileting;Transfers;Hygiene/Grooming;Lift    Examination-Participation Restrictions Cleaning;Community Activity;Laundry;Occupation;Driving;Meal Prep    Stability/Clinical Decision Making Unstable/Unpredictable    Rehab Potential Good    PT Frequency 2x / week   1-2x a week for 12 weeks   PT Duration 12 weeks   1-2x a week for 12 weeks   PT Treatment/Interventions ADLs/Self Care Home Management;Electrical Stimulation;DME Instruction;Gait training;Functional mobility training;Therapeutic activities;Neuromuscular re-education;Balance training;Therapeutic exercise;Patient/family education;Orthotic Fit/Training;Manual techniques;Passive range of motion;Energy conservation;Vestibular    PT Next Visit Plan Check STG,was mother able to get OT order signed? WB strengthening for LE from elevated mat, sit > stand and standing with SARA if you have plus 2.  Continue to work on increasing independence with slideboards    Consulted and Agree with Plan of Care Patient;Family member/caregiver    Family Member Consulted pt's mom             Patient will benefit from skilled therapeutic intervention in order to improve the following deficits and impairments:  Decreased activity tolerance, Decreased balance, Decreased coordination, Decreased endurance, Decreased mobility, Decreased range of motion, Difficulty walking, Decreased strength, Hypomobility, Increased muscle spasms, Impaired flexibility, Impaired tone, Impaired sensation, Impaired UE functional use, Postural dysfunction  Visit Diagnosis: Difficulty in walking, not elsewhere classified  Muscle weakness (generalized)  Abnormal posture  Other symptoms and signs involving the nervous system  Problem List Patient Active Problem List   Diagnosis Date Noted   Finger osteomyelitis, left ([place]) 09/23/2019   Leukocytosis 09/23/2019    Rico Junker, PT, DPT 07/04/21    8:47 PM    [facility] [address] [place], [place], [address] Phone: [phone]   Fax:  [phone]  Name: Jadynn Hurdle MRN: [id number] Date of Birth: 1966-02-14

## 2021-07-05 ENCOUNTER — Encounter: Payer: Self-pay | Admitting: Physical Therapy

## 2021-07-05 ENCOUNTER — Ambulatory Visit: Payer: Medicaid Other | Attending: Neurology | Admitting: Physical Therapy

## 2021-07-05 ENCOUNTER — Other Ambulatory Visit: Payer: Self-pay

## 2021-07-05 DIAGNOSIS — R278 Other lack of coordination: Secondary | ICD-10-CM | POA: Insufficient documentation

## 2021-07-05 DIAGNOSIS — R262 Difficulty in walking, not elsewhere classified: Secondary | ICD-10-CM | POA: Diagnosis not present

## 2021-07-05 DIAGNOSIS — M24542 Contracture, left hand: Secondary | ICD-10-CM | POA: Diagnosis not present

## 2021-07-05 DIAGNOSIS — R29818 Other symptoms and signs involving the nervous system: Secondary | ICD-10-CM | POA: Insufficient documentation

## 2021-07-05 DIAGNOSIS — R293 Abnormal posture: Secondary | ICD-10-CM | POA: Diagnosis not present

## 2021-07-05 DIAGNOSIS — R29898 Other symptoms and signs involving the musculoskeletal system: Secondary | ICD-10-CM | POA: Diagnosis not present

## 2021-07-05 DIAGNOSIS — M6281 Muscle weakness (generalized): Secondary | ICD-10-CM | POA: Diagnosis not present

## 2021-07-05 DIAGNOSIS — G8252 Quadriplegia, C1-C4 incomplete: Secondary | ICD-10-CM | POA: Diagnosis not present

## 2021-07-05 DIAGNOSIS — Z419 Encounter for procedure for purposes other than remedying health state, unspecified: Secondary | ICD-10-CM | POA: Diagnosis not present

## 2021-07-06 DIAGNOSIS — G8252 Quadriplegia, C1-C4 incomplete: Secondary | ICD-10-CM | POA: Diagnosis not present

## 2021-07-06 DIAGNOSIS — R2689 Other abnormalities of gait and mobility: Secondary | ICD-10-CM | POA: Diagnosis not present

## 2021-07-06 NOTE — Therapy (Signed)
[facility] [address] [place], [place], [address] Phone: [phone]   Fax:  [phone]  Physical Therapy Treatment  Patient Details  Name: Carlos [name] MRN: [id number] Date of Birth: [birth year] Referring Provider (PT): Tommas Olp, MD   Encounter Date: 07/05/2021   PT End of Session - 07/05/21 1409     Visit Number 4    Number of Visits 19    Date for PT Re-Evaluation 08/30/21    Authorization Type [employer]-    Authorization Time Period 12 visit from 06/15/21- 08/07/21    Authorization - Visit Number 3    Authorization - Number of Visits 12    PT Start Time 5686    PT Stop Time 1445    PT Time Calculation (min) 43 min    Equipment Utilized During Treatment Other (comment)   Slideboard and SARA plus   Activity Tolerance Patient tolerated treatment well    Behavior During Therapy [facility] for tasks assessed/performed             Past Medical History:  Diagnosis Date   Crush injury to hand 12/2018   bilateral hands with multipe fractures   Finger osteomyelitis, 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   Subjective Assessment - 07/05/21 1407     Subjective Was tired after last session. Mom plans to get OT script signed when they go for Botox injections. No pain or falls. Started on [place] today.    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    Currently in Pain? No/denies                    [facility] Adult PT Treatment/Exercise - 07/05/21 1413       Transfers   Transfers Lateral/Scoot Transfers;Sit to Stand;Stand to Sit    Sit to Stand 2: Max assist;From elevated surface;From bed;With upper extremity assist    Sit to Stand Details (indicate cue type and reason) use of Clarise Cruz plus    Stand to Sit 2: Max assist;To elevated surface;To bed;With upper extremity assist    Stand to Sit Details use of Clarise Cruz plus    Lateral/Scoot Transfers 2: Max assist;4: Min assist;From elevated surface;With slide board    Lateral/Scoot Transfer Details (indicate cue type and reason) from wheelchair to mat table max assist to initiate getting onto the board then min assist to complete the transfer going level<>level surfaces. from mat table>wheelchair min assist to initiate getting onto board, then min guard to min assist to complete the transfer going from higher>lower surface.      Self-Care   Self-Care Other Self-Care Comments    Other Self-Care Comments  reviewd HEP. Pt and mom report doing them at home with no issues. Continue to be challenging and  remain appropriate.      Therapeutic Activites    Therapeutic Activities Other Therapeutic Activities    Other Therapeutic Activities use of Clarise Cruz plus with no foot place or waist pad for standing- performed 4 reps total with PTA/tech assist at pelvis to faciliate weight shifing and hip extension with standing. Pt stood for 2 minutes with 1st stand, 1 minute 45.63 sec's on 2cd stand and just shy of 1 minute on final 2 stands. min to mod assist at pelvis with each side working on getting into full hip extension. pt unable to extend knees off block pads without assistance.      Exercises   Exercises Other Exercises    Other Exercises  seated at edge of mat table: long arc quads  for 10 reps , yoga block squeeze between knees for 5 sec holds x 10 reps, marching x 10 reps, all bil LE's with AA at times for control and full ROM.                      PT Short Term Goals - 07/05/21 1409       PT SHORT TERM GOAL #1   Title Pt and pt's family will be independent with initial HEP for ROM/strengthening in order to build upon functional gains made in therapy. ALL STGS DUE 9/122    Baseline 07/05/21: has program that he is doing and still challenging.    Status Achieved    Target Date --      PT SHORT TERM GOAL #2   Title Pt will be able to perform sliding board transfer from w/c <> mat table with mod A in order to demo improved functional transfers to decr caregiver burden and transfer into his power w/c.    Baseline 07/05/21: max assist to initiate, the min guard to min assist, improved just not to goal level    Time --    Period --    Status Partially Met      PT SHORT TERM GOAL #3   Title Bed mobility to further be assessed with STG/LTG to be written.    Baseline see previous note from 06/28/21 for mobility details. PT to set goals as indicated.    Time --    Period --    Status Achieved      PT SHORT TERM GOAL #4   Title Pt will perform dynamic sitting tasks for at least 5 minutes with supervision in order to demo improved sitting tolerance for ADLs.    Baseline 07/05/21: met in session    Time --    Period --    Status Achieved      PT SHORT TERM GOAL #5   Title Pt will stand in standing frame x1 minute with mod A in order to demo functional weight bearing.    Baseline 07/05/21: met in session with Clarise Cruz Plus lift    Time --    Period Weeks    Status Achieved               PT Long Term Goals - 06/14/21 1656       PT LONG TERM GOAL #1   Title Bed mobility goal to be written as appropriate. ALL LTGS DUE 08/02/21    Baseline did not have time to perform at eval    Time 8    Period Weeks    Status New    Target Date 08/02/21      PT  LONG  TERM GOAL #2   Title Pt will perform sliding board transfers with min guard in order to demo improved functional transfers.    Baseline max A currently needed    Time 8    Period Weeks    Status New      PT LONG TERM GOAL #3   Title Pt will perform stand pivot transfers with mod A in order to demo improved functional mobility.    Baseline not yet assessed.    Time 8    Period Weeks    Status New      PT LONG TERM GOAL #4   Title Pt will be independent with mobility around obstacles and outdoor unlevel surfaces with power w/c in order to demo improved independence.    Baseline pt has not transferred into his power w/c yet for use.    Time 8    Period Weeks    Status New      PT LONG TERM GOAL #5   Title Pt will stand at [facility] with min/mod A for 1 minute in order to demo improved standing tolerance.    Baseline not yet assessed.    Time 8    Period Weeks    Status New                   Plan - 07/05/21 1409     Clinical Impression Statement Today's skilled session focused on progress toward STGs with goals partially to full met. Continued to work on [employer] transfers, LE strengthening and standing with use of Sara Plus. No issues noted or reported in session. The pt did attemept to lift right LE with one stand for stepping, however spasms caused flexor momement and did not attempt again. Pt has started taking oral Baclofen as of today which may help with this. The pt is making progress toward goals and should benefit from continued PT to progress toward unmet goals.    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    Examination-Activity Limitations Bathing;Bed Mobility;Caring for Others;Locomotion  Level;Squat;Dressing;Reach Overhead;Stand;Toileting;Transfers;Hygiene/Grooming;Lift    Examination-Participation Restrictions Cleaning;Community Activity;Laundry;Occupation;Driving;Meal Prep    Stability/Clinical Decision Making Unstable/Unpredictable    Rehab Potential Good    PT Frequency 2x / week   1-2x a week for 12 weeks   PT Duration 12 weeks   1-2x a week for 12 weeks   PT Treatment/Interventions ADLs/Self Care Home Management;Electrical Stimulation;DME Instruction;Gait training;Functional mobility training;Therapeutic activities;Neuromuscular re-education;Balance training;Therapeutic exercise;Patient/family education;Orthotic Fit/Training;Manual techniques;Passive range of motion;Energy conservation;Vestibular    PT Next Visit Plan ,was mother able to get OT order signed? WB strengthening for LE from elevated mat, sit > stand and standing with SARA if you have plus 2.  Continue to work on increasing independence with slideboards    Consulted and Agree with Plan of Care Patient;Family member/caregiver    Family Member Consulted pt's mom             Patient will benefit from skilled therapeutic intervention in order to improve the following deficits and impairments:  Decreased activity tolerance, Decreased balance, Decreased coordination, Decreased endurance, Decreased mobility, Decreased range of motion, Difficulty walking, Decreased strength, Hypomobility, Increased muscle spasms, Impaired flexibility, Impaired tone, Impaired sensation, Impaired UE functional use, Postural dysfunction  Visit Diagnosis: Difficulty in walking, not elsewhere classified  Muscle weakness (generalized)  Abnormal posture  Other symptoms and signs involving the nervous system     Problem List Patient Active Problem List   Diagnosis Date Noted   Finger osteomyelitis, left ([place]) 09/23/2019   Leukocytosis 09/23/2019    Willow Ora, PTA, [facility] [address], [place], [place] [address] [phone] 07/06/21, 9:24 AM   Name: Cuauhtemoc Huegel MRN: [id number] Date of Birth: 10/09/1966

## 2021-07-07 DIAGNOSIS — G8252 Quadriplegia, C1-C4 incomplete: Secondary | ICD-10-CM | POA: Diagnosis not present

## 2021-07-08 DIAGNOSIS — G8252 Quadriplegia, C1-C4 incomplete: Secondary | ICD-10-CM | POA: Diagnosis not present

## 2021-07-10 ENCOUNTER — Encounter: Payer: Self-pay | Admitting: Physical Therapy

## 2021-07-10 ENCOUNTER — Ambulatory Visit: Payer: Medicaid Other | Admitting: Physical Therapy

## 2021-07-10 ENCOUNTER — Other Ambulatory Visit: Payer: Self-pay

## 2021-07-10 DIAGNOSIS — R293 Abnormal posture: Secondary | ICD-10-CM

## 2021-07-10 DIAGNOSIS — M6281 Muscle weakness (generalized): Secondary | ICD-10-CM | POA: Diagnosis not present

## 2021-07-10 DIAGNOSIS — R262 Difficulty in walking, not elsewhere classified: Secondary | ICD-10-CM | POA: Diagnosis not present

## 2021-07-10 DIAGNOSIS — R278 Other lack of coordination: Secondary | ICD-10-CM | POA: Diagnosis not present

## 2021-07-10 DIAGNOSIS — R29898 Other symptoms and signs involving the musculoskeletal system: Secondary | ICD-10-CM | POA: Diagnosis not present

## 2021-07-10 DIAGNOSIS — G8252 Quadriplegia, C1-C4 incomplete: Secondary | ICD-10-CM | POA: Diagnosis not present

## 2021-07-10 DIAGNOSIS — M24542 Contracture, left hand: Secondary | ICD-10-CM | POA: Diagnosis not present

## 2021-07-10 DIAGNOSIS — R29818 Other symptoms and signs involving the nervous system: Secondary | ICD-10-CM | POA: Diagnosis not present

## 2021-07-11 ENCOUNTER — Encounter: Payer: Self-pay | Admitting: Physical Therapy

## 2021-07-11 DIAGNOSIS — G8252 Quadriplegia, C1-C4 incomplete: Secondary | ICD-10-CM | POA: Diagnosis not present

## 2021-07-11 NOTE — Therapy (Signed)
[facility] [address] [place], [place], [address] Phone: [phone]   Fax:  [phone]  Physical Therapy Treatment  Patient Details  Name: Carlos [name] MRN: [id number] Date of Birth: [birth year] Referring Provider (PT): Tommas Olp, MD   Encounter Date: 07/10/2021   PT End of Session - 07/11/21 0807     Visit Number 5    Number of Visits 19    Date for PT Re-Evaluation 08/30/21    Authorization Type [employer]-    Authorization Time Period 12 visit from 06/15/21- 08/07/21    Authorization - Visit Number 4    Authorization - Number of Visits 12    PT Start Time 1450    PT Stop Time 1530    PT Time Calculation (min) 40 min    Equipment Utilized During Treatment Other (comment);Gait belt   Slideboard and SARA plus   Activity Tolerance Patient tolerated treatment well    Behavior During Therapy [facility] for tasks assessed/performed             Past Medical History:  Diagnosis Date   Crush injury to hand 12/2018   bilateral hands with multipe fractures   Finger osteomyelitis, 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   Subjective Assessment - 07/10/21 1452     Subjective Has been taking his oral Baclofen - reports moving has been a little easier and stretches are getting better. Getting Botox on Friday.    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    Currently in Pain? No/denies                               [facility] Adult PT Treatment/Exercise - 07/11/21 0809       Transfers   Transfers Lateral/Scoot Transfers;Sit to Stand;Stand to Sit    Sit to Stand 2: Max assist;From elevated surface;From bed;With upper extremity assist    Sit to Stand Details (indicate cue type and reason) use of Clarise Cruz plus - without foot plate and sling    Stand to Sit 2: Max assist;To elevated surface;To bed;With upper extremity assist    Stand to Sit Details use of Clarise Cruz plus    Lateral/Scoot Transfers 2: Max assist;4: Min Passenger transport manager Details (indicate cue type and reason) from wheelchair > mat table at start of session, needing total A for slide board placement and max A for transfer, needing cues for head hips relationship to help unweight hips. at end of session performed from elevated mat table > pt's w/c (pt able to shift weight to R to unweight L hip to help with more with slideboard placement) , using gravity to help assist with pt needing min/mod A today.  cues to push through BLEs and to use RUE to help with transfer.      Therapeutic Activites    Therapeutic Activities Other Therapeutic Activities    Other Therapeutic Activities from elevated mat and therapist anteriorly in front of pt: cued to push through BLE and use RUE to assist to lift buttocks off mat and hold for 5-7 seconds for 8 reps total, mod A for weight shifting forwards and to help lift buttocks, use of Clarise Cruz plus with no foot plate or waist pad for standing - performed x3 reps total throughout session: cued for weight shift forwards and hip/knee extension when standing. 1st stand: pt able to stand for 2 minutes, performed x10 reps glute sets in standing, 2nd standing attempt: x1 minute - attempted alternating leg lifts off ground, pt  unable to lift feet. 3rd standing attempt: approx 45 seconds - 1 mintute. pt reporting incr L shoulder discomfort with standing today. pt standing with bilat knee flexion against pads                    PT Short Term Goals - 07/05/21 1409       PT SHORT TERM GOAL #1   Title Pt and pt's family will be independent with initial HEP for ROM/strengthening in order to build upon functional gains made in therapy. ALL STGS DUE 9/122    Baseline 07/05/21: has program that he is doing and still challenging.    Status Achieved    Target Date --      PT SHORT TERM GOAL #2   Title Pt will be able to perform sliding board transfer from w/c <> mat table with mod A in order to demo improved functional transfers to decr caregiver burden and transfer into his power w/c.    Baseline 07/05/21: max assist to initiate, the min guard to min assist, improved just not to goal level    Time --    Period --    Status Partially Met      PT SHORT TERM GOAL #3   Title Bed mobility to further be assessed with STG/LTG to be written.    Baseline see previous note from 06/28/21 for mobility details. PT to set goals as indicated.    Time --    Period --    Status Achieved      PT SHORT TERM GOAL #4   Title Pt will perform dynamic sitting tasks for at least 5 minutes with supervision in order to demo improved sitting tolerance for ADLs.    Baseline 07/05/21: met in session    Time --    Period --    Status Achieved      PT SHORT TERM GOAL #5   Title Pt will stand in standing frame x1 minute with mod A in order to demo functional weight bearing.    Baseline 07/05/21: met in session with Clarise Cruz Plus lift    Time --    Period Weeks    Status Achieved               PT Long Term Goals - 06/14/21 1656       PT LONG TERM GOAL #1   Title Bed mobility goal to be written as appropriate. ALL LTGS DUE 08/02/21    Baseline did not have time to perform at eval    Time 8    Period Weeks    Status New    Target  Date 08/02/21  PT LONG TERM GOAL #2   Title Pt will perform sliding board transfers with min guard in order to demo improved functional transfers.    Baseline max A currently needed    Time 8    Period Weeks    Status New      PT LONG TERM GOAL #3   Title Pt will perform stand pivot transfers with mod A in order to demo improved functional mobility.    Baseline not yet assessed.    Time 8    Period Weeks    Status New      PT LONG TERM GOAL #4   Title Pt will be independent with mobility around obstacles and outdoor unlevel surfaces with power w/c in order to demo improved independence.    Baseline pt has not transferred into his power w/c yet for use.    Time 8    Period Weeks    Status New      PT LONG TERM GOAL #5   Title Pt will stand at [facility] with min/mod A for 1 minute in order to demo improved standing tolerance.    Baseline not yet assessed.    Time 8    Period Weeks    Status New                   Plan - 07/11/21 0819     Clinical Impression Statement Continued to work on sliding board transfers, BLE weight bearing, and standing with Clarise Cruz Plus from elevated mat table. Pt able to stand for a max of 2 minutes today before needing to sit due to fatigue. Attempted stepping today from standing in [place], no flexion moment noted, but pt unable to lift. Pt will be going to [place] this friday for botox to LUE.  Will continue to progress towards LTGs.    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    Examination-Activity Limitations Bathing;Bed Mobility;Caring for Others;Locomotion Level;Squat;Dressing;Reach Overhead;Stand;Toileting;Transfers;Hygiene/Grooming;Lift    Examination-Participation Restrictions  Cleaning;Community Activity;Laundry;Occupation;Driving;Meal Prep    Stability/Clinical Decision Making Unstable/Unpredictable    Rehab Potential Good    PT Frequency 2x / week   1-2x a week for 12 weeks   PT Duration 12 weeks   1-2x a week for 12 weeks   PT Treatment/Interventions ADLs/Self Care Home Management;Electrical Stimulation;DME Instruction;Gait training;Functional mobility training;Therapeutic activities;Neuromuscular re-education;Balance training;Therapeutic exercise;Patient/family education;Orthotic Fit/Training;Manual techniques;Passive range of motion;Energy conservation;Vestibular    PT Next Visit Plan pt's mom getting OT order signed this friday. WB strengthening for LE from elevated mat, sit > stand and standing with SARA plus.  Continue to work on increasing independence with slideboards    Consulted and Agree with Plan of Care Patient;Family member/caregiver    Family Member Consulted pt's mom             Patient will benefit from skilled therapeutic intervention in order to improve the following deficits and impairments:  Decreased activity tolerance, Decreased balance, Decreased coordination, Decreased endurance, Decreased mobility, Decreased range of motion, Difficulty walking, Decreased strength, Hypomobility, Increased muscle spasms, Impaired flexibility, Impaired tone, Impaired sensation, Impaired UE functional use, Postural dysfunction  Visit Diagnosis: Difficulty in walking, not elsewhere classified  Muscle weakness (generalized)  Abnormal posture     Problem List Patient Active Problem List   Diagnosis Date Noted  Finger osteomyelitis, left ([place]) 09/23/2019   Leukocytosis 09/23/2019    Arliss Journey, PT, DPT  07/11/2021, 8:27 AM  [facility] [address] [place], [place], [address] Phone: [phone]   Fax:  [phone]  Name: Carlos [name] MRN: [id number] Date of Birth:  1965-12-18

## 2021-07-12 ENCOUNTER — Other Ambulatory Visit: Payer: Self-pay

## 2021-07-12 ENCOUNTER — Ambulatory Visit: Payer: Medicaid Other | Admitting: Physical Therapy

## 2021-07-12 ENCOUNTER — Encounter: Payer: Self-pay | Admitting: Physical Therapy

## 2021-07-12 DIAGNOSIS — R29818 Other symptoms and signs involving the nervous system: Secondary | ICD-10-CM | POA: Diagnosis not present

## 2021-07-12 DIAGNOSIS — R278 Other lack of coordination: Secondary | ICD-10-CM | POA: Diagnosis not present

## 2021-07-12 DIAGNOSIS — M24542 Contracture, left hand: Secondary | ICD-10-CM | POA: Diagnosis not present

## 2021-07-12 DIAGNOSIS — R29898 Other symptoms and signs involving the musculoskeletal system: Secondary | ICD-10-CM | POA: Diagnosis not present

## 2021-07-12 DIAGNOSIS — M6281 Muscle weakness (generalized): Secondary | ICD-10-CM | POA: Diagnosis not present

## 2021-07-12 DIAGNOSIS — G8252 Quadriplegia, C1-C4 incomplete: Secondary | ICD-10-CM | POA: Diagnosis not present

## 2021-07-12 DIAGNOSIS — R293 Abnormal posture: Secondary | ICD-10-CM | POA: Diagnosis not present

## 2021-07-12 DIAGNOSIS — R262 Difficulty in walking, not elsewhere classified: Secondary | ICD-10-CM | POA: Diagnosis not present

## 2021-07-12 NOTE — Therapy (Signed)
[facility] [address] [place], [place], [address] Phone: [phone]   Fax:  [phone]  Physical Therapy Treatment  Patient Details  Name: Carlos [name] MRN: [id number] Date of Birth: 10/13/1966 Referring Provider (PT): Tommas Olp, MD   Encounter Date: 07/12/2021   PT End of Session - 07/12/21 1452     Visit Number 6    Number of Visits 19    Date for PT Re-Evaluation 08/30/21    Authorization Type [employer]-    Authorization Time Period 12 visit from 06/15/21- 08/07/21    Authorization - Visit Number 5    Authorization - Number of Visits 12    PT Start Time 8882    PT Stop Time 1530    PT Time Calculation (min) 41 min    Equipment Utilized During Treatment Other (comment);Gait belt   Slideboard and SARA plus   Activity Tolerance Patient tolerated treatment well    Behavior During Therapy [name] for tasks assessed/performed             Past Medical History:  Diagnosis Date   Crush injury to hand 12/2018   bilateral hands with multipe fractures   Finger osteomyelitis, left ([place]) 12/2018    Past Surgical History:  Procedure Laterality Date   I & D EXTREMITY Left 09/23/2019   Procedure: IRRIGATION AND DEBRIDEMENT OF  LEFT LONG FINGER, REVISION OF LEFT LONG FINGER AMPUTATION;  Surgeon: Verner Mould, MD;  Location: [facility];  Service: Orthopedics;  Laterality: Left;   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   Subjective Assessment - 07/12/21 1451     Subjective No new complaints. No falls or pain to report. Has Botox tomorrow and mom plans to have written OT script signed at that time.    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    Currently in Pain? No/denies                    [facility] Adult PT Treatment/Exercise - 07/12/21 1453       Transfers   Transfers Lateral/Scoot Transfers;Sit to Stand;Stand to Sit    Sit to Stand 3: Mod assist;2: Max assist;From elevated surface;From bed    Sit to Stand Details (indicate cue type and reason) continued with use of Sara Plus with no footplate or sling used    Stand to Sit 2: Max assist;With upper extremity assist;To elevated surface;To bed    Stand to Sit Details use of Clarise Cruz Plus    Lateral/Scoot Transfers 2: Max assist;3: Mod assist;4: Min assist;From elevated surface;With armrests removed;With slide board    Lateral/Scoot Transfer Details (indicate cue type and reason) wheelchair>mat table with total assist to place slide board, then max assist to initiate getting onto board, once on board mod assist to complete slide transfer with cues for technique, head/hip relationship. Changed out pt's cushion to higher and more firm cushion. performed transfer mat table>wheelchair with mod assist to place board as pt was able to weight shifting off of right side. mod>min assist for transfer to wheelchair. back in wheelchair on new  cushion- max assist to place board as pt able to weight shifting toward right side and assist with lifting left LE up. mod/max assist for transfer across board toward mat table. at end of session mod>min assist to transfer to wheelchair. Pt needing cues for technique, weight shifting and to use UE's to "lift" hips with all transfers.      Therapeutic Activites    Therapeutic Activities Other Therapeutic Activities    Other Therapeutic Activities standing with Clarise Cruz Plus x 3 reps: 1st stand working on standing tolerance with emphasis on tall posture with total standing time of 3 minutes 35.23 sec's with min guard to min assist, cues for knee/hip  extension. on 2cd stand once up tall had pt slide each foot back with assistance, then working on terminal knee extension in stance. Pt able to activly perform a few reps on right side, then with assist performed a few reps on the left side. on 3rd stand- once pt up tall attempted to work on stepping in place. Pt able to perform 2-3 reps on right, unable to lift left foot at all with assitance. pt needed min guard to min assist for balance/safety in standing with rehab tech present to assist with keeping Clarise Cruz lift steady as it was moving despite locks on.                       PT Short Term Goals - 07/05/21 1409       PT SHORT TERM GOAL #1   Title Pt and pt's family will be independent with initial HEP for ROM/strengthening in order to build upon functional gains made in therapy. ALL STGS DUE 9/122    Baseline 07/05/21: has program that he is doing and still challenging.    Status Achieved    Target Date --      PT SHORT TERM GOAL #2   Title Pt will be able to perform sliding board transfer from w/c <> mat table with mod A in order to demo improved functional transfers to decr caregiver burden and transfer into his power w/c.    Baseline 07/05/21: max assist to initiate, the min guard to min assist, improved just not to goal level    Time --    Period --    Status Partially Met      PT SHORT TERM GOAL #3   Title Bed mobility to further be assessed with STG/LTG to be written.    Baseline see previous note from 06/28/21 for mobility details. PT to set goals as indicated.    Time --    Period --    Status Achieved      PT SHORT TERM GOAL #4   Title Pt will perform dynamic sitting tasks for at least 5 minutes with supervision in order to demo improved sitting tolerance for ADLs.    Baseline 07/05/21: met in session    Time --    Period --    Status Achieved      PT SHORT TERM GOAL #5   Title Pt will stand in standing frame x1 minute with mod A in order to demo functional weight  bearing.    Baseline 07/05/21: met in session with [place] lift    Time --    Period Weeks    Status Achieved               PT Long Term Goals - 06/14/21 1656  PT LONG TERM GOAL #1   Title Bed mobility goal to be written as appropriate. ALL LTGS DUE 08/02/21    Baseline did not have time to perform at eval    Time 8    Period Weeks    Status New    Target Date 08/02/21      PT LONG TERM GOAL #2   Title Pt will perform sliding board transfers with min guard in order to demo improved functional transfers.    Baseline max A currently needed    Time 8    Period Weeks    Status New      PT LONG TERM GOAL #3   Title Pt will perform stand pivot transfers with mod A in order to demo improved functional mobility.    Baseline not yet assessed.    Time 8    Period Weeks    Status New      PT LONG TERM GOAL #4   Title Pt will be independent with mobility around obstacles and outdoor unlevel surfaces with power w/c in order to demo improved independence.    Baseline pt has not transferred into his power w/c yet for use.    Time 8    Period Weeks    Status New      PT LONG TERM GOAL #5   Title Pt will stand at [facility] with min/mod A for 1 minute in order to demo improved standing tolerance.    Baseline not yet assessed.    Time 8    Period Weeks    Status New                   Plan - 07/12/21 1453     Clinical Impression Statement Today's skilled sessionconitnued to focus on use of slide board for lateral transfers with some improvement noted with more firm seat cushion. Also continued to focus on use of Clarise Cruz Plus for sit<>stand transfers and strengthening in standing with no issues noted or reported in session. The pt is progressing and should benefit from continued PT to progress toward unmet goals.    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    Examination-Activity Limitations Bathing;Bed Mobility;Caring for Others;Locomotion Level;Squat;Dressing;Reach Overhead;Stand;Toileting;Transfers;Hygiene/Grooming;Lift    Examination-Participation Restrictions Cleaning;Community Activity;Laundry;Occupation;Driving;Meal Prep    Stability/Clinical Decision Making Unstable/Unpredictable    Rehab Potential Good    PT Frequency 2x / week   1-2x a week for 12 weeks   PT Duration 12 weeks   1-2x a week for 12 weeks   PT Treatment/Interventions ADLs/Self Care Home Management;Electrical Stimulation;DME Instruction;Gait training;Functional mobility training;Therapeutic activities;Neuromuscular re-education;Balance training;Therapeutic exercise;Patient/family education;Orthotic Fit/Training;Manual techniques;Passive range of motion;Energy conservation;Vestibular    PT Next Visit Plan pt's mom getting OT order signed this friday. WB strengthening for LE from elevated mat, sit > stand and standing with SARA plus.  Continue to work on increasing independence with slideboards    Consulted and Agree with Plan of Care Patient;Family member/caregiver    Family Member Consulted pt's mom             Patient will benefit from skilled therapeutic intervention in order to improve the following deficits and impairments:  Decreased activity tolerance, Decreased balance, Decreased coordination, Decreased endurance, Decreased mobility, Decreased range of motion, Difficulty walking, Decreased strength, Hypomobility, Increased muscle  spasms, Impaired flexibility, Impaired tone, Impaired sensation, Impaired UE functional use, Postural dysfunction  Visit Diagnosis: Difficulty in walking, not elsewhere classified  Muscle weakness (generalized)  Abnormal posture  Other symptoms and signs involving the nervous  system     Problem List Patient Active Problem List   Diagnosis Date Noted   Finger osteomyelitis, left ([place]) 09/23/2019   Leukocytosis 09/23/2019    Willow Ora, PTA, [facility] [address] [place], [place] [address] [phone] 07/12/21, 4:05 PM   Name: Carlos [name] MRN: [id number] Date of Birth: 1966/09/06

## 2021-07-13 DIAGNOSIS — G8252 Quadriplegia, C1-C4 incomplete: Secondary | ICD-10-CM | POA: Diagnosis not present

## 2021-07-14 DIAGNOSIS — G8252 Quadriplegia, C1-C4 incomplete: Secondary | ICD-10-CM | POA: Diagnosis not present

## 2021-07-15 DIAGNOSIS — G8252 Quadriplegia, C1-C4 incomplete: Secondary | ICD-10-CM | POA: Diagnosis not present

## 2021-07-17 ENCOUNTER — Other Ambulatory Visit: Payer: Self-pay

## 2021-07-17 ENCOUNTER — Encounter: Payer: Self-pay | Admitting: Physical Therapy

## 2021-07-17 ENCOUNTER — Ambulatory Visit: Payer: Medicaid Other | Admitting: Physical Therapy

## 2021-07-17 DIAGNOSIS — R29818 Other symptoms and signs involving the nervous system: Secondary | ICD-10-CM

## 2021-07-17 DIAGNOSIS — G8252 Quadriplegia, C1-C4 incomplete: Secondary | ICD-10-CM | POA: Diagnosis not present

## 2021-07-17 DIAGNOSIS — R293 Abnormal posture: Secondary | ICD-10-CM | POA: Diagnosis not present

## 2021-07-17 DIAGNOSIS — M6281 Muscle weakness (generalized): Secondary | ICD-10-CM | POA: Diagnosis not present

## 2021-07-17 DIAGNOSIS — R278 Other lack of coordination: Secondary | ICD-10-CM | POA: Diagnosis not present

## 2021-07-17 DIAGNOSIS — R262 Difficulty in walking, not elsewhere classified: Secondary | ICD-10-CM

## 2021-07-17 DIAGNOSIS — M24542 Contracture, left hand: Secondary | ICD-10-CM | POA: Diagnosis not present

## 2021-07-17 DIAGNOSIS — R29898 Other symptoms and signs involving the musculoskeletal system: Secondary | ICD-10-CM | POA: Diagnosis not present

## 2021-07-17 NOTE — Therapy (Addendum)
[facility] [address] [place], [place], [address] Phone: [phone]   Fax:  [phone]  Physical Therapy Treatment  Patient Details  Name: Carlos [name] MRN: [id number] Date of Birth: 1965-12-04 Referring Provider (PT): Tommas Olp, MD   Encounter Date: 07/17/2021   PT End of Session - 07/17/21 1802     Visit Number 7    Number of Visits 19    Date for PT Re-Evaluation 08/30/21    Authorization Type [employer]-    Authorization Time Period 12 visit from 06/15/21- 08/07/21    Authorization - Visit Number 6    Authorization - Number of Visits 12    PT Start Time 9528    PT Stop Time 1531    PT Time Calculation ([name]) 44 [name]    Equipment Utilized During Treatment Other (comment);Gait belt   Slideboard and SARA plus   Activity Tolerance Patient tolerated treatment well    Behavior During Therapy Carlos [name] for tasks assessed/performed             Past Medical History:  Diagnosis Date   Crush injury to hand 12/2018   bilateral hands with multipe fractures   Finger osteomyelitis, 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   Subjective Assessment - 07/17/21 1451     Subjective Medicaid transportation was not able to pick him up for his botox appointment last week. 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 Crush injury to hand (B hands with multiple fractures), partial amputation left 3rd  finger    Patient Stated Goals wants to be able to walk to the bathroom, wants to roll onto his belly.    Currently in Pain? No/denies                               [facility] Adult PT Treatment/Exercise - 07/17/21 1512       Transfers   Transfers Lateral/Scoot Transfers;Sit to Stand;Stand to Sit    Sit to Stand 3: Mod assist;2: Max assist;From elevated surface;From bed    Sit to Stand Details (indicate cue type and reason) use of Clarise Cruz plus with no foot plate or sling    Stand to Sit 3: Mod assist    Stand to Sit Details use of Sara Plus to lower towards mat    Lateral/Scoot Transfers 2: Max assist;3: Mod assist;4: [name] assist;From elevated surface;With armrests removed;With slide board    Lateral/Scoot Transfer Details (indicate cue type and reason) from wheelchair > level mat, pt needing total A to properly place slide board, max A to initiate transfer, mod A for remainder of transfer over to mat with continued cues for head/hips relationship and pushing through legs to help lift buttocks, pt with improved ease of transfer with new cushion. at end of session performed from elevated mat table>wheelchair, pt able to shift weight to R on mat table to place  board, for transfer initially required mod A>[name] to get into w/c. once in w/c needed max A from therapist to get pt scooted back into w/c for proper positioning      Therapeutic Activites    Therapeutic Activities Other Therapeutic Activities    Other Therapeutic Activities standing in [place] [employer] from elevated mat table, put arm straps on to keep UE in place - cued for incr forward lean with sit > stand; 2 bouts of 1 minute each. 1st bout: had pt stand with tall posture and perform x10 reps glute squeezes, pt requesting to sit after 1 minute (stomach was not feeling well this past weekend and standing was bothersome), pt did want to try one more stand, stood for an additional x1 minute with performing x6 reps mini squats, pt  requesting to sit again with pt reporting incr L shoulder discomfort.      Exercises   Exercises Other Exercises    Other Exercises  seated at edge of mat table: long arc quads for 10 reps each leg - cues for isometric hold and AA for full ROM, heel slides with pillow case under foot x10 reps RLE with use of yellow tband, x10 reps LLE with no resistance - therapist assisting keeping leg in proper alignment. seated alternating marching x10 reps, marching with LLE only (AA needed at times) - cued to press down through RLE, then repeated x10 reps with RLE                     PT Education - 07/18/21 0745     Education Details Since pt did not get picked up by Medicaid transportation for his botox appt last week (this has been an issue for other appts too), discussed [facility] for spasticity management where pt will have [facility] transportation (that consistently picks pt up) - gave pt's mom information to take to next MD appt (next week) and PT to fax over referral request. PT to also re-send OT referral request since pt was not able to get order signed at last appt.    Person(s) Educated Patient;Parent(s)    Methods Explanation    Comprehension Verbalized understanding              PT Short Term Goals - 07/05/21 1409       PT SHORT TERM GOAL #1   Title Pt and pt's family will be independent with initial HEP for ROM/strengthening in order to build upon functional gains made in therapy. ALL STGS DUE 9/122    Baseline 07/05/21: has program that he is doing and still challenging.    Status Achieved    Target Date --      PT SHORT TERM GOAL #2   Title Pt will be able to perform sliding board transfer from w/c <> mat table with mod A in order to demo improved functional transfers to decr caregiver burden and transfer into his power w/c.    Baseline 07/05/21: max assist to initiate, the [name] guard to [name] assist, improved just not to goal level    Time --    Period --     Status Partially Met      PT SHORT TERM GOAL #3   Title Bed mobility to further be assessed with STG/LTG to be written.    Baseline see previous note from 06/28/21 for mobility details. PT to set goals as indicated.    Time --    Period --  Status Achieved      PT SHORT TERM GOAL #4   Title Pt will perform dynamic sitting tasks for at least 5 minutes with supervision in order to demo improved sitting tolerance for ADLs.    Baseline 07/05/21: met in session    Time --    Period --    Status Achieved      PT SHORT TERM GOAL #5   Title Pt will stand in standing frame x1 minute with mod A in order to demo functional weight bearing.    Baseline 07/05/21: met in session with Clarise Cruz Plus lift    Time --    Period Weeks    Status Achieved               PT Long Term Goals - 06/14/21 1656       PT LONG TERM GOAL #1   Title Bed mobility goal to be written as appropriate. ALL LTGS DUE 08/02/21    Baseline did not have time to perform at eval    Time 8    Period Weeks    Status New    Target Date 08/02/21      PT LONG TERM GOAL #2   Title Pt will perform sliding board transfers with [name] guard in order to demo improved functional transfers.    Baseline max A currently needed    Time 8    Period Weeks    Status New      PT LONG TERM GOAL #3   Title Pt will perform stand pivot transfers with mod A in order to demo improved functional mobility.    Baseline not yet assessed.    Time 8    Period Weeks    Status New      PT LONG TERM GOAL #4   Title Pt will be independent with mobility around obstacles and outdoor unlevel surfaces with power w/c in order to demo improved independence.    Baseline pt has not transferred into his power w/c yet for use.    Time 8    Period Weeks    Status New      PT LONG TERM GOAL #5   Title Pt will stand at [name] with [name]/mod A for 1 minute in order to demo improved standing tolerance.    Baseline not yet assessed.    Time 8    Period  Weeks    Status New                   Plan - 07/18/21 0758     Clinical Impression Statement Pt with improvement in sliding board transfers with more firm seat cushion - needing mod/max A from w/c > mat table and mod/[name] from mat table > w/c. Continued to work on [employer] for standing today - only able to stand for a max of 1 minute bouts. Limited today due to incr L shoulder pain in standing and pt's stomach not feeling well. Remainder of session focused on seated BLE strengthening with active assist needed for full ROM. Will continue to progress towards LTGs.    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    Examination-Activity Limitations Bathing;Bed Mobility;Caring for Others;Locomotion Level;Squat;Dressing;Reach Overhead;Stand;Toileting;Transfers;Hygiene/Grooming;Lift    Examination-Participation Restrictions Cleaning;Community Activity;Laundry;Occupation;Driving;Meal Prep    Stability/Clinical Decision Making  Unstable/Unpredictable    Rehab Potential Good    PT Frequency 2x / week   1-2x a week for 12 weeks   PT Duration 12 weeks   1-2x a week for 12 weeks   PT Treatment/Interventions ADLs/Self Care Home Management;Electrical Stimulation;DME Instruction;Gait training;Functional mobility training;Therapeutic activities;Neuromuscular re-education;Balance training;Therapeutic exercise;Patient/family education;Orthotic Fit/Training;Manual techniques;Passive range of motion;Energy conservation;Vestibular    PT Next Visit Plan any update with OT referral or physical medicine and rehab referral? continue WB strengthening for LE from elevated mat, sit > stand and standing with SARA plus.  Continue to work on increasing independence  with slideboards. seated strengthening.    Consulted and Agree with Plan of Care Patient;Family member/caregiver    Family Member Consulted pt's mom             Patient will benefit from skilled therapeutic intervention in order to improve the following deficits and impairments:  Decreased activity tolerance, Decreased balance, Decreased coordination, Decreased endurance, Decreased mobility, Decreased range of motion, Difficulty walking, Decreased strength, Hypomobility, Increased muscle spasms, Impaired flexibility, Impaired tone, Impaired sensation, Impaired UE functional use, Postural dysfunction  Visit Diagnosis: Difficulty in walking, not elsewhere classified  Muscle weakness (generalized)  Abnormal posture  Other symptoms and signs involving the nervous system     Problem List Patient Active Problem List   Diagnosis Date Noted   Finger osteomyelitis, left ([place]) 09/23/2019   Leukocytosis 09/23/2019    Arliss Journey, PT, DPT  07/18/2021, 8:02 AM  [facility] [address] [place], [place], [address] Phone: [phone]   Fax:  [phone]  Name: Carlos [name] MRN: [id number] Date of Birth: 1966/03/13

## 2021-07-18 ENCOUNTER — Telehealth: Payer: Self-pay | Admitting: Physical Therapy

## 2021-07-18 DIAGNOSIS — G8252 Quadriplegia, C1-C4 incomplete: Secondary | ICD-10-CM | POA: Diagnosis not present

## 2021-07-18 NOTE — Telephone Encounter (Signed)
Faxed to provider as provider not in Epic:  Dr. Shela Leff,  Leldon Normil has been seen by Physical Therapy at [facility].   The patient would benefit from a referral to [facility] for Spasticity management. Pt has been unable to go to appts for botox due to Medicaid transportation not picking him up. He has [employer] transportation that has been reliable so he would be able to make appts in the [facility] system.  Their information is as follows:  [facility] [place],  [place]  [address] Phone: [phone] Fax: [phone]   Would you please be able to fax over again an order for OT for decr functional BUE use, R hand contracture, incr spasticity, and decr strength? For some reason it didn't come through before.   Please fax the order to [phone].  Thank you so much, Janann August, PT, DPT 07/18/21 8:23 AM    [facility] [address] [place], [place]  [address] Phone:  [phone] Fax:  [phone]

## 2021-07-19 ENCOUNTER — Other Ambulatory Visit: Payer: Self-pay

## 2021-07-19 ENCOUNTER — Ambulatory Visit: Payer: Medicaid Other | Admitting: Physical Therapy

## 2021-07-19 DIAGNOSIS — R293 Abnormal posture: Secondary | ICD-10-CM

## 2021-07-19 DIAGNOSIS — M24542 Contracture, left hand: Secondary | ICD-10-CM | POA: Diagnosis not present

## 2021-07-19 DIAGNOSIS — R262 Difficulty in walking, not elsewhere classified: Secondary | ICD-10-CM

## 2021-07-19 DIAGNOSIS — R29898 Other symptoms and signs involving the musculoskeletal system: Secondary | ICD-10-CM | POA: Diagnosis not present

## 2021-07-19 DIAGNOSIS — G8252 Quadriplegia, C1-C4 incomplete: Secondary | ICD-10-CM | POA: Diagnosis not present

## 2021-07-19 DIAGNOSIS — R29818 Other symptoms and signs involving the nervous system: Secondary | ICD-10-CM

## 2021-07-19 DIAGNOSIS — R278 Other lack of coordination: Secondary | ICD-10-CM | POA: Diagnosis not present

## 2021-07-19 DIAGNOSIS — M6281 Muscle weakness (generalized): Secondary | ICD-10-CM | POA: Diagnosis not present

## 2021-07-19 NOTE — Therapy (Signed)
[facility] [address] [place], [place], [address] Phone: [phone]   Fax:  [phone]  Physical Therapy Treatment  Patient Details  Name: Carlos [name] MRN: [id number] Date of Birth: 11-15-1965 Referring Provider (PT): Tommas Olp, MD   Encounter Date: 07/19/2021   PT End of Session - 07/19/21 1458     Visit Number 8    Number of Visits 19    Date for PT Re-Evaluation 08/30/21    Authorization Type [employer]-    Authorization Time Period 12 visit from 06/15/21- 08/07/21    Authorization - Visit Number 7    Authorization - Number of Visits 12    PT Start Time 8768    PT Stop Time 1535    PT Time Calculation (min) 41 min    Equipment Utilized During Treatment Other (comment)   Slideboard   Activity Tolerance Patient tolerated treatment well    Behavior During Therapy [facility] for tasks assessed/performed             Past Medical History:  Diagnosis Date   Crush injury to hand 12/2018   bilateral hands with multipe fractures   Finger osteomyelitis, 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   Subjective Assessment - 07/19/21 1459     Subjective Arrived a little late.  Was able to get Botox rescheduled for October 3rd.  Mom is going to rent a [employer] just in case transportation can't take him.    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 Crush injury to hand (B hands with multiple fractures), partial amputation left  3rd finger    Patient Stated Goals wants to be able to walk to the bathroom, wants to roll onto his belly.    Currently in Pain? No/denies              [facility] Adult PT Treatment/Exercise - 07/19/21 1616       Transfers   Transfers Lateral/Scoot Transfers    Lateral/Scoot Transfers 4: Min guard;4: Min assist;With Museum/gallery conservator Details (indicate cue type and reason) from w/c > mat on L pushing through RUE and LE with cues for anterior lean and weight shift forwards over BOS; pt able to scoot across board to L with supervision once set up.  Going back to w/c from mat to R cued pt to WB and push through RUE with hand in fist position and therapist stabilizing wrist; continued to cue pt to lean head and shoulders to L to unweight hips; pt preformed transfer with min A back into wheelchair    Number of Reps 2 sets      Exercises   Exercises Knee/Hip;Shoulder      Knee/Hip Exercises: Aerobic   Stepper SCI FIT stepper at level 3.0 x 8 minutes with LE only for extensor strengthening and endurance training with therapist providing facilitation to maintain neutral hip and knee position on LLE      Shoulder Exercises: Seated  Extension Strengthening;Right;5 reps;Limitations    Extension Limitations Shoulder and elbow strengthening with RUE on push up block performing anterior lean with activation for UE extension and LE extension.  Unable to clear hips from mat.  Transitioned to hand on the mat and therapist placing sheet under buttocks; continued to cue pt to perform anterior lean with upright trunk and anterior pelvic tilt to shift COG over BOS and to extend through shoulder and LE; able to clear hips slightly from lower mat with therapist facilitating lift of pelvis and anterior weight shift over BOS.               PT Education - 07/19/21 1615     Education Details Set up OT eval today; visit limitation with Medicaid    Person(s) Educated Patient;Parent(s)     Methods Explanation    Comprehension Verbalized understanding              PT Short Term Goals - 07/05/21 1409       PT SHORT TERM GOAL #1   Title Pt and pt's family will be independent with initial HEP for ROM/strengthening in order to build upon functional gains made in therapy. ALL STGS DUE 9/122    Baseline 07/05/21: has program that he is doing and still challenging.    Status Achieved    Target Date --      PT SHORT TERM GOAL #2   Title Pt will be able to perform sliding board transfer from w/c <> mat table with mod A in order to demo improved functional transfers to decr caregiver burden and transfer into his power w/c.    Baseline 07/05/21: max assist to initiate, the min guard to min assist, improved just not to goal level    Time --    Period --    Status Partially Met      PT SHORT TERM GOAL #3   Title Bed mobility to further be assessed with STG/LTG to be written.    Baseline see previous note from 06/28/21 for mobility details. PT to set goals as indicated.    Time --    Period --    Status Achieved      PT SHORT TERM GOAL #4   Title Pt will perform dynamic sitting tasks for at least 5 minutes with supervision in order to demo improved sitting tolerance for ADLs.    Baseline 07/05/21: met in session    Time --    Period --    Status Achieved      PT SHORT TERM GOAL #5   Title Pt will stand in standing frame x1 minute with mod A in order to demo functional weight bearing.    Baseline 07/05/21: met in session with Clarise Cruz Plus lift    Time --    Period Weeks    Status Achieved               PT Long Term Goals - 06/14/21 1656       PT LONG TERM GOAL #1   Title Bed mobility goal to be written as appropriate. ALL LTGS DUE 08/02/21    Baseline did not have time to perform at eval    Time 8    Period Weeks    Status New    Target Date 08/02/21      PT LONG TERM GOAL #2   Title Pt will perform sliding board transfers with min guard in order to demo improved  functional transfers.  Baseline max A currently needed    Time 8    Period Weeks    Status New      PT LONG TERM GOAL #3   Title Pt will perform stand pivot transfers with mod A in order to demo improved functional mobility.    Baseline not yet assessed.    Time 8    Period Weeks    Status New      PT LONG TERM GOAL #4   Title Pt will be independent with mobility around obstacles and outdoor unlevel surfaces with power w/c in order to demo improved independence.    Baseline pt has not transferred into his power w/c yet for use.    Time 8    Period Weeks    Status New      PT LONG TERM GOAL #5   Title Pt will stand at [facility] with min/mod A for 1 minute in order to demo improved standing tolerance.    Baseline not yet assessed.    Time 8    Period Weeks    Status New                   Plan - 07/19/21 1556     Clinical Impression Statement Pt demonstrates significant improvement in endurance and ability to sequence slideboard transfers today. Pt performed LE extension strengthening and endurance on SciFit stepper x 8 minutes with LE only and was able to perform level slideboard transfers to L with set up assistance only.  When performing slideboard back to the R pt able to perform WB through L fist and LUE with therapist stabilizing wrist; continued to require cues for use of head hips relationship to weight shift off of buttocks to advance back into w/c.  Continued to address UE and LE extension weakness on mat with functional WB activities.  Pt now has order for OT: discussed plan for OT eval and upcoming visits.    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    Examination-Activity Limitations Bathing;Bed  Mobility;Caring for Others;Locomotion Level;Squat;Dressing;Reach Overhead;Stand;Toileting;Transfers;Hygiene/Grooming;Lift    Examination-Participation Restrictions Cleaning;Community Activity;Laundry;Occupation;Driving;Meal Prep    Stability/Clinical Decision Making Unstable/Unpredictable    Rehab Potential Good    PT Frequency 2x / week   1-2x a week for 12 weeks   PT Duration 12 weeks   1-2x a week for 12 weeks   PT Treatment/Interventions ADLs/Self Care Home Management;Electrical Stimulation;DME Instruction;Gait training;Functional mobility training;Therapeutic activities;Neuromuscular re-education;Balance training;Therapeutic exercise;Patient/family education;Orthotic Fit/Training;Manual techniques;Passive range of motion;Energy conservation;Vestibular    PT Next Visit Plan SCI Fit in wheelchair with LE only for extension/endurance; continue WB strengthening for LE from elevated mat, sit > stand and standing with SARA plus.  Continue to work on increasing independence with slideboards. seated strengthening.    Consulted and Agree with Plan of Care Patient;Family member/caregiver    Family Member Consulted pt's mom             Patient will benefit from skilled therapeutic intervention in order to improve the following deficits and impairments:  Decreased activity tolerance, Decreased balance, Decreased coordination, Decreased endurance, Decreased mobility, Decreased range of motion, Difficulty walking, Decreased strength, Hypomobility, Increased muscle spasms, Impaired flexibility, Impaired tone, Impaired sensation, Impaired UE functional use, Postural dysfunction  Visit Diagnosis: Difficulty in walking, not elsewhere classified  Muscle weakness (  generalized)  Abnormal posture  Other symptoms and signs involving the nervous system     Problem List Patient Active Problem List   Diagnosis Date Noted   Finger osteomyelitis, left ([place]) 09/23/2019   Leukocytosis 09/23/2019     Rico Junker, PT, DPT 07/19/21    4:31 PM   [facility] [address] [place], [place], [address] Phone: [phone]   Fax:  [phone]  Name: Carlos [name] MRN: [id number] Date of Birth: 1966/04/05

## 2021-07-20 DIAGNOSIS — G8252 Quadriplegia, C1-C4 incomplete: Secondary | ICD-10-CM | POA: Diagnosis not present

## 2021-07-21 DIAGNOSIS — G8252 Quadriplegia, C1-C4 incomplete: Secondary | ICD-10-CM | POA: Diagnosis not present

## 2021-07-22 DIAGNOSIS — G8252 Quadriplegia, C1-C4 incomplete: Secondary | ICD-10-CM | POA: Diagnosis not present

## 2021-07-23 DIAGNOSIS — G8252 Quadriplegia, C1-C4 incomplete: Secondary | ICD-10-CM | POA: Diagnosis not present

## 2021-07-24 ENCOUNTER — Ambulatory Visit: Payer: Medicaid Other | Admitting: Occupational Therapy

## 2021-07-24 ENCOUNTER — Encounter: Payer: Self-pay | Admitting: Physical Therapy

## 2021-07-24 ENCOUNTER — Other Ambulatory Visit: Payer: Self-pay

## 2021-07-24 ENCOUNTER — Ambulatory Visit: Payer: Medicaid Other | Admitting: Physical Therapy

## 2021-07-24 DIAGNOSIS — M6281 Muscle weakness (generalized): Secondary | ICD-10-CM

## 2021-07-24 DIAGNOSIS — R29898 Other symptoms and signs involving the musculoskeletal system: Secondary | ICD-10-CM

## 2021-07-24 DIAGNOSIS — M24542 Contracture, left hand: Secondary | ICD-10-CM | POA: Diagnosis not present

## 2021-07-24 DIAGNOSIS — R29818 Other symptoms and signs involving the nervous system: Secondary | ICD-10-CM

## 2021-07-24 DIAGNOSIS — R278 Other lack of coordination: Secondary | ICD-10-CM | POA: Diagnosis not present

## 2021-07-24 DIAGNOSIS — R262 Difficulty in walking, not elsewhere classified: Secondary | ICD-10-CM

## 2021-07-24 DIAGNOSIS — R293 Abnormal posture: Secondary | ICD-10-CM | POA: Diagnosis not present

## 2021-07-24 DIAGNOSIS — G8252 Quadriplegia, C1-C4 incomplete: Secondary | ICD-10-CM | POA: Diagnosis not present

## 2021-07-24 NOTE — Therapy (Addendum)
[facility] [address] [place], [place], [address] Phone: [phone]   Fax:  [phone]  Physical Therapy Treatment  Patient Details  Name: Carlos [name] MRN: [id number] Date of Birth: January 11, 1966 Referring Provider (PT): Tommas Olp, MD   Encounter Date: 07/24/2021   PT End of Session - 07/24/21 1630     Visit Number 9    Number of Visits 19    Date for PT Re-Evaluation 08/30/21    Authorization Type [employer]-    Authorization Time Period 12 visit from 06/15/21- 08/07/21    Authorization - Visit Number 8    Authorization - Number of Visits 12    PT Start Time 2458   pt running late from OT eval   PT Stop Time 1530    PT Time Calculation (min) 39 min    Equipment Utilized During Treatment Other (comment)   Slideboard   Activity Tolerance Patient tolerated treatment well    Behavior During Therapy [facility] for tasks assessed/performed             Past Medical History:  Diagnosis Date   Crush injury to hand 12/2018   bilateral hands with multipe fractures   Finger osteomyelitis, 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   Subjective Assessment - 07/24/21 1455     Subjective No changes since he was last here. Just had his OT eval.    Patient is accompained by: Family member   mom, Carlos [name]   Pertinent History Had posterior C3-C7 laminectomies for excision of intradural intramedullary mass on 11/23/2020. 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    Currently in Pain? No/denies                               [facility] Adult PT Treatment/Exercise - 07/25/21 1410       Transfers   Transfers Lateral/Scoot Transfers    Sit to Stand 1: +2 Total assist    Sit to Stand Details Verbal cues for sequencing;Verbal cues for technique;Manual facilitation for weight shifting;Manual facilitation for placement    Sit to Stand Details (indicate cue type and reason) trialed Stedy today after discussion with OT to see if pt would be able to help grip with LUE to help stand up for potential future goal of family using Stedy to help transfer pt to bathroom, standing with helping with dressing/ADLs and other transfers. with elevated mat table, 2 attempts with PT and PTA to get pt to come to stand, but unsuccessful with manual/verbal cues for forward weight shift, pt using underhand grip with LUE. able to stand with 3rd attempt with total A x2 - pt able to stand for ~5 seconds before pt reporting incr pain in shins due to pad placement and wishing to sit back down, put seat pads immediately placed under pt. with pt partially seated in [place] had  discussion with OT/PT about how this can be a LTG going forward with trying to stand with the [facility] for functional use at home. Removed Stedy pads and brought pt back down to mat table with max A    Stand to Sit 2: Max assist    Stand to Sit Details for controlled descent back to mat table from [employer]    Lateral/Scoot Transfers 4: Min guard;4: Min assist;With Museum/gallery conservator Details (indicate cue type and reason) from w/c > mat table going towards R needing total A for slide board placement, cues for head/hips relationship and using LUE to help scoot over towards mat, initially needing min A to get onto slideboard and then min guard for remainder of transfer to mat. performed at end of session going towards pt L into w/c, pt able to weight  shift towards R to unweight buttocks for therapist to place slideboard, pt able to perform transfer back into chair with assist of gravity with initial min A and then min guard      Knee/Hip Exercises: Aerobic   Stepper SCI FIT stepper at level 3.0 > 3.2 x 8 minutes with LE only for extensor strengthening and endurance training, cues for full ROM, used belt around legs for neutral positioning. anti-tippers behind wheels and therapist helping to steady w/c                     PT Education - 07/25/21 1402     Education Details Discussed with OT and pt/pt's mom at end of OT eval how to split up pt's Medicaid visits for remainder of calendar year - pt would like to use 17 for PT and 10 for OT    Person(s) Educated Patient;Parent(s)    Methods Explanation    Comprehension Verbalized understanding              PT Short Term Goals - 07/05/21 1409       PT SHORT TERM GOAL #1   Title Pt and pt's family will be independent with initial HEP for ROM/strengthening in order to build upon functional gains made in therapy. ALL STGS DUE 9/122    Baseline 07/05/21: has program that he is doing and still challenging.    Status Achieved    Target Date --      PT SHORT TERM GOAL #2   Title Pt will be able to perform sliding board transfer from w/c <> mat table with mod A in order to demo improved functional transfers to decr caregiver burden and transfer into his power w/c.    Baseline 07/05/21: max assist to initiate, the min guard to min assist, improved just not to goal level    Time --    Period --    Status Partially Met      PT SHORT TERM GOAL #3   Title Bed mobility to further be assessed with STG/LTG to be written.    Baseline see previous note from 06/28/21 for mobility details. PT to set goals as indicated.    Time --    Period --    Status Achieved      PT SHORT TERM GOAL #4   Title Pt will perform dynamic sitting tasks for at least 5 minutes with supervision in order to demo  improved sitting tolerance for ADLs.    Baseline 07/05/21: met in session    Time --    Period --    Status Achieved  PT SHORT TERM GOAL #5   Title Pt will stand in standing frame x1 minute with mod A in order to demo functional weight bearing.    Baseline 07/05/21: met in session with Clarise Cruz Plus lift    Time --    Period Weeks    Status Achieved               PT Long Term Goals - 06/14/21 1656       PT LONG TERM GOAL #1   Title Bed mobility goal to be written as appropriate. ALL LTGS DUE 08/02/21    Baseline did not have time to perform at eval    Time 8    Period Weeks    Status New    Target Date 08/02/21      PT LONG TERM GOAL #2   Title Pt will perform sliding board transfers with min guard in order to demo improved functional transfers.    Baseline max A currently needed    Time 8    Period Weeks    Status New      PT LONG TERM GOAL #3   Title Pt will perform stand pivot transfers with mod A in order to demo improved functional mobility.    Baseline not yet assessed.    Time 8    Period Weeks    Status New      PT LONG TERM GOAL #4   Title Pt will be independent with mobility around obstacles and outdoor unlevel surfaces with power w/c in order to demo improved independence.    Baseline pt has not transferred into his power w/c yet for use.    Time 8    Period Weeks    Status New      PT LONG TERM GOAL #5   Title Pt will stand at [facility] with min/mod A for 1 minute in order to demo improved standing tolerance.    Baseline not yet assessed.    Time 8    Period Weeks    Status New                   Plan - 07/25/21 1421     Clinical Impression Statement Pt continues with improvement of sliding board transfers today, able to perform with min guard/min A to and from mat table. Continues to need cues for head/hips relationship throughout transfer to unweight buttocks. Trialed the [facility] today for standing with pt needing total A x2 to come to  stand, pt unable to tolerate standing in [place] today due to incr pain in shins (will have to see for next session if knee pads can be moved up for improved comfort due to pt's height). Discussed that using the Stedy could be a goal going forward for transfers/ADLs for home. Pt and pt's mom in agreement. Will continue to progress towards LTGs.    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    Examination-Activity Limitations Bathing;Bed Mobility;Caring for Others;Locomotion Level;Squat;Dressing;Reach Overhead;Stand;Toileting;Transfers;Hygiene/Grooming;Lift    Examination-Participation Restrictions Cleaning;Community Activity;Laundry;Occupation;Driving;Meal Prep    Stability/Clinical Decision Making Unstable/Unpredictable    Rehab Potential Good    PT Frequency 2x / week   1-2x a week for 12 weeks   PT Duration 12 weeks   1-2x a week for 12 weeks   PT Treatment/Interventions  ADLs/Self Care Home Management;Electrical Stimulation;DME Instruction;Gait training;Functional mobility training;Therapeutic activities;Neuromuscular re-education;Balance training;Therapeutic exercise;Patient/family education;Orthotic Fit/Training;Manual techniques;Passive range of motion;Energy conservation;Vestibular    PT Next Visit Plan SCI Fit in wheelchair with LE only for extension/endurance; continue WB strengthening for LE from elevated mat, sit > stand and standing with SARA plus. can try again with Stedy if have +2 assist and see if Carlos [name] can move Stedy pads up.  Continue to work on increasing independence with slideboards. seated strengthening.    Consulted and Agree with Plan of Care Patient;Family member/caregiver    Family Member Consulted pt's mom              Patient will benefit from skilled therapeutic intervention in order to improve the following deficits and impairments:  Decreased activity tolerance, Decreased balance, Decreased coordination, Decreased endurance, Decreased mobility, Decreased range of motion, Difficulty walking, Decreased strength, Hypomobility, Increased muscle spasms, Impaired flexibility, Impaired tone, Impaired sensation, Impaired UE functional use, Postural dysfunction  Visit Diagnosis: Difficulty in walking, not elsewhere classified  Muscle weakness (generalized)  Other symptoms and signs involving the nervous system     Problem List Patient Active Problem List   Diagnosis Date Noted   Finger osteomyelitis, left ([place]) 09/23/2019   Leukocytosis 09/23/2019    Carlos [name], PT, DPT 07/25/2021, 2:24 PM  [facility] [address] [place], [place], [address] Phone: [phone]   Fax:  [phone]  Name: Carlos [name] MRN: [id number] Date of Birth: [birth year]

## 2021-07-24 NOTE — Therapy (Signed)
[facility] [address] [place], [place], [address] Phone: [phone]   Fax:  [phone]  Occupational Therapy Evaluation  Patient Details  Name: Carlos [name] MRN: [id number] Date of Birth: 04-01-1966 No data recorded  Encounter Date: 07/24/2021   OT End of Session - 07/24/21 1552     Visit Number 1    Number of Visits 11    Date for OT Re-Evaluation --   up to 10 weeks out (10/06/21) if 1x/wk   Authorization Type [facility] - awaiting authorization, asking for 10 (P.T. 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   Subjective Assessment - 07/24/21 1408     Patient is accompanied by: Family member   mom and daughter   Pertinent History spinal cord ependymoma diagnosed 09/2020, C3-C7 laminectomies on 11/23/20 to remove intradural intramedullary mass. Crush injuries bilateral hands 2 years ago w/ multiple fx's, Lt long finger partial amputation. Lt hand now min opening since neck surgery    Currently in Pain? No/denies               [facility] OT Assessment - 07/24/21 0001       Assessment   Medical Diagnosis spinal cord ependymoma    Onset Date/Surgical Date 11/23/20    Hand Dominance Left   now using  Rt hand   Prior Therapy [facility]      Precautions   Precautions Fall      Balance Screen   Has the patient fallen in the past 6 months --   w/c bound at this time     Home  Environment   Additional Comments Pt lives in 1 story home with 92 y.o. daughter and 64 y.o. son. Mother comes in daily prn. CNA comes 6x/week for up to 3 hours and gives pt sponge bath in bed, changes sheets, cleans. 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, systems    Leisure used to Set designer classes, fishing, hiking      ADL   Eating/Feeding Needs assist with cutting food   eating w/ Rt non dominant hand   Grooming Moderate assistance   brushing teeth Rt hand, dependent for brushing hair   Upper Body Bathing Maximal assistance   sponge bathing in bed   Lower Body Bathing Maximal assistance   spong bathing in bed   Upper Body Dressing + 1 Total assistance    Lower Body Dressing +1 Total aassistance    Toilet Transfer --  uses bed pan or urinal mostly but beginning to use BSC   Toileting - Clothing Manipulation Maximal assistance    Toileting -  Hygiene + 1 Total assistance    Tub/Shower Transfer --   N/A at this time   ADL comments dependent for IADLS      Mobility   Mobility Status Comments Dependent - uses w/c   has power w/c     Written Expression   Dominant Hand Left   now uses Rt hand - can write name     Vision - History   Baseline Vision Wears glasses only for reading      Cognition   Overall Cognitive Status Within Functional Limits for tasks assessed      Observation/Other Assessments   Observations w/c bound      Posture/Postural Control   Posture/Postural Control Postural limitations    Postural Limitations Posterior pelvic tilt;Forward head;Rounded Shoulders      Sensation   Light Touch Impaired Detail    Additional Comments Pt abscent Rt hand except  thumb, Lt hand feels some dorsally and base of thumb, but none volarly - improves more proximally      Coordination   Box and Blocks TBA for RUE (unable LUE)    Coordination No functional use Lt hand due to contractures, Rt hand has gross flex/ext and some function      Edema   Edema none      ROM / Strength   AROM / PROM / Strength AROM      AROM   Overall AROM Comments RUE: sh flex to 65*, ER approx 60%, IR approx 50%, elbow WFL's, gross full finger flex/ext w/ dystonia. Long and small fingers w/ limited PIP extension.  LUE: elbow flexed w/ forearm supinated, wrist extension and finger flexion. Can passively extend Lt elbow to -45*, passive wrist flex to neutral only, contractures at MP joints in full flex, however can passively extend IP joints w/ limitations index finger      Hand Function   Right Hand Grip (lbs) TBA    Left Hand Grip (lbs) Unable      RUE Tone   RUE Tone Mild      LUE Tone   LUE Tone Hypertonic   severe in Lt hand w/ contractures, mod forearm/elbow                             OT Education - 07/24/21 1541     Education Details OT POC, MCD visit limit, Progression towards possible use of Stedy for toilet transfers    Person(s) Educated Patient;Parent(s);Child(ren)    Methods Explanation    Comprehension Verbalized understanding              OT Short Term Goals - 07/24/21 1619       OT SHORT TERM GOAL #1   Title Caregiver independent w/ LUE stretching HEP    Baseline Not yet issued    Time 2    Period Weeks    Status New      OT SHORT TERM GOAL #2   Title Pt independent with RUE HEP for shoulder ROM, functional use, and hand coordination    Baseline Not yet issued    Time 2    Period Weeks    Status New      OT SHORT TERM GOAL #3   Title Caregiver independent with splint wear and care Lt hand  Baseline not yet issued    Time 2    Period Weeks    Status New               OT Long Term Goals - 07/24/21 1620        OT LONG TERM GOAL #1   Title Pt to improve Rt shoulder motion to brush 75% or more of hair, and assist w/ donning shirt w/ mod assist    Baseline dependent, sh flex to 65*, ER approx 60%    Time 5    Period Weeks    Status New      OT LONG TERM GOAL #2   Title Box & Blocks goal TBD to improve RUE function    Baseline not yet assessed    Time 5    Period Weeks    Status New      OT LONG TERM GOAL #3   Title Pt to perform toilet transfer using sliding board and drop arm BSC or Stedy lift w/ max assist x 1    Baseline unable - currently using bed pan in bed    Time 5    Period Weeks    Status New      OT LONG TERM GOAL #4   Title Pt/family to verbalize understanding with task modifications and AE/DME needs to increase participation and independence w/ ADLS    Baseline Dependent    Time 5    Period Weeks    Status New                   Plan - 07/24/21 1555     Clinical Impression Statement Pt is a 55 y.o. male who presents to [place] with spinal cord ependymoma s/p C3-C7 laminectomies on 11/23/20 for excision of intradural intramedullary mass. Pt also reports crush injuries bilateral hands w/ mutiple fx's approx 2 years ago w/ Lt long finger partial amputation, however contractures of Lt hand has developed since neck surgery per pt report. Pt has multiple deficits including limited ROM LUE and limited shoulder motion RUE, contractures Lt hand, dystonia Rt hand, dependent for transfers and most ADLS. Pt would benefit from O.T. to increase independence w/ ADLS, decrease caregiver burden of care, prevent future contractures, and maintain/improve ROM, as well as improve participation in functional transfers, and any A/E and DME recommendations.    OT Occupational Profile and History Detailed Assessment- Review of Records and additional review of physical, cognitive, psychosocial history related to current functional performance    Occupational performance deficits (Please refer  to evaluation for details): ADL's;IADL's;Leisure;Social Participation    Body Structure / Function / Physical Skills ADL;Decreased knowledge of use of DME;Strength;Pain;Dexterity;UE functional use;Body mechanics;Proprioception;IADL;Endurance;ROM;Improper spinal/pelvic alignment;Mobility;Coordination;Flexibility;Sensation;FMC    Rehab Potential Fair    Clinical Decision Making Multiple treatment options, significant modification of task necessary    Comorbidities Affecting Occupational Performance: May have comorbidities impacting occupational performance    Modification or Assistance to Complete Evaluation  Min-Moderate modification of tasks or assist with assess necessary to complete eval    OT Frequency 2x / week    OT Duration --   for 5 weeks OR 10 visits over 10 weeks (if pt reduces down to 1x/wk)   OT Treatment/Interventions Self-care/ADL training;Moist Heat;Fluidtherapy;DME and/or AE instruction;Splinting;Therapeutic activities;Therapeutic exercise;Coping strategies training;Neuromuscular education;Functional Mobility Training;Passive range of motion;Patient/family education;Paraffin    Plan assess Box & Blocks and grip strength Rt hand and update goals, caregiver stretching program for LUE (focus on shoulder, elbow ext, pronation,  wrist flex, and finger ext as able) and HEP for RUE for shoulder ROM, functional use RUE, and coordination Rt hand, consider splinting options for Lt hand    Consulted and Agree with Plan of Care Patient;Family member/caregiver    Family Member Consulted mother and daughter             Patient will benefit from skilled therapeutic intervention in order to improve the following deficits and impairments:   Body Structure / Function / Physical Skills: ADL, Decreased knowledge of use of DME, Strength, Pain, Dexterity, UE functional use, Body mechanics, Proprioception, IADL, Endurance, ROM, Improper spinal/pelvic alignment, Mobility, Coordination, Flexibility,  Sensation, [facility]       Visit Diagnosis: Abnormal posture  Other symptoms and signs involving the nervous system  Other symptoms and signs involving the musculoskeletal system  Contracture of left hand  Muscle weakness (generalized)  Other lack of coordination    Problem List Patient Active Problem List   Diagnosis Date Noted   Finger osteomyelitis, left ([place]) 09/23/2019   Leukocytosis 09/23/2019   [employer] Authorization   Choose one: Neuro Rehabilitative  Standardized Assessment or Functional Outcome Tool: See Pain Assessment and N/A  Score or Percent Disability: 90% disabled  Body Parts Treated (Select each separately):  Shoulder. Overall deficits/functional limitations for body part selected: severe Elbow. Overall deficits/functional limitations for body part selected: moderate Hand/Wrist. Overall deficits/functional limitations for body part selected: severe   Carey Bullocks, OTR/L 07/24/2021, 4:28 PM  [facility] [address] [place], [place], [address] Phone: [phone]   Fax:  [phone]  Name: Carlos [name] MRN: [id number] Date of Birth: 1966-07-16

## 2021-07-25 DIAGNOSIS — G8252 Quadriplegia, C1-C4 incomplete: Secondary | ICD-10-CM | POA: Diagnosis not present

## 2021-07-26 ENCOUNTER — Other Ambulatory Visit: Payer: Self-pay

## 2021-07-26 ENCOUNTER — Encounter: Payer: Self-pay | Admitting: Occupational Therapy

## 2021-07-26 ENCOUNTER — Encounter: Payer: Self-pay | Admitting: Physical Therapy

## 2021-07-26 ENCOUNTER — Ambulatory Visit: Payer: Medicaid Other | Admitting: Occupational Therapy

## 2021-07-26 ENCOUNTER — Ambulatory Visit: Payer: Medicaid Other | Admitting: Physical Therapy

## 2021-07-26 DIAGNOSIS — R262 Difficulty in walking, not elsewhere classified: Secondary | ICD-10-CM | POA: Diagnosis not present

## 2021-07-26 DIAGNOSIS — R29818 Other symptoms and signs involving the nervous system: Secondary | ICD-10-CM

## 2021-07-26 DIAGNOSIS — M6281 Muscle weakness (generalized): Secondary | ICD-10-CM

## 2021-07-26 DIAGNOSIS — R278 Other lack of coordination: Secondary | ICD-10-CM

## 2021-07-26 DIAGNOSIS — R29898 Other symptoms and signs involving the musculoskeletal system: Secondary | ICD-10-CM | POA: Diagnosis not present

## 2021-07-26 DIAGNOSIS — R293 Abnormal posture: Secondary | ICD-10-CM

## 2021-07-26 DIAGNOSIS — M24542 Contracture, left hand: Secondary | ICD-10-CM

## 2021-07-26 DIAGNOSIS — G8252 Quadriplegia, C1-C4 incomplete: Secondary | ICD-10-CM | POA: Diagnosis not present

## 2021-07-26 NOTE — Therapy (Signed)
[facility] [address] [place], [place], [address] Phone: [phone]   Fax:  [phone]  Occupational Therapy Treatment  Patient Details  Name: Carlos [name] MRN: [id number] Date of Birth: 25-Jul-1966 No data recorded  Encounter Date: 07/26/2021   OT End of Session - 07/26/21 1532     Visit Number 2    Number of Visits 11    Date for OT Re-Evaluation --   up to 10 weeks out (10/06/21) if 1x/wk   Authorization Type M[facility] - awaiting authorization, asking for 10 (P.T. 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   Subjective Assessment - 07/26/21 1531     Subjective  Pt reports no pain or changes.    Patient is accompanied by: Family member   son   Pertinent History spinal cord ependymoma diagnosed 09/2020, C3-C7 laminectomies on 11/23/20 to remove intradural intramedullary mass. Crush injuries bilateral hands 2 years ago w/ multiple fx's, Lt long finger partial amputation. Lt hand now min opening since neck surgery    Currently in Pain? No/denies                     Began custom resting hand splint fabrication today for LUE.                OT Short Term Goals - 07/24/21 1619        OT SHORT TERM GOAL #1   Title Caregiver independent w/ LUE stretching HEP    Baseline Not yet issued    Time 2    Period Weeks    Status New      OT SHORT TERM GOAL #2   Title Pt independent with RUE HEP for shoulder ROM, functional use, and hand coordination    Baseline Not yet issued    Time 2    Period Weeks    Status New      OT SHORT TERM GOAL #3   Title Caregiver independent with splint wear and care Lt hand    Baseline not yet issued    Time 2    Period Weeks    Status New               OT Long Term Goals - 07/24/21 1620       OT LONG TERM GOAL #1   Title Pt to improve Rt shoulder motion to brush 75% or more of hair, and assist w/ donning shirt w/ mod assist    Baseline dependent, sh flex to 65*, ER approx 60%    Time 5    Period Weeks  Status New      OT LONG TERM GOAL #2   Title Box & Blocks goal TBD to improve RUE function    Baseline not yet assessed    Time 5    Period Weeks    Status New      OT LONG TERM GOAL #3   Title Pt to perform toilet transfer using sliding board and drop arm BSC or Stedy lift w/ max assist x 1    Baseline unable - currently using bed pan in bed    Time 5    Period Weeks    Status New      OT LONG TERM GOAL #4   Title Pt/family to verbalize understanding with task modifications and AE/DME needs to increase participation and independence w/ ADLS    Baseline Dependent    Time 5    Period Weeks    Status New                   Plan - 07/26/21 1636     Clinical Impression Statement Began custom resting hand splint fabrication today.    OT Occupational Profile and History Detailed Assessment- Review of Records and additional review of physical, cognitive, psychosocial history related to current functional performance    Occupational performance deficits (Please refer to evaluation for details): ADL's;IADL's;Leisure;Social Participation    Body Structure / Function / Physical Skills ADL;Decreased  knowledge of use of DME;Strength;Pain;Dexterity;UE functional use;Body mechanics;Proprioception;IADL;Endurance;ROM;Improper spinal/pelvic alignment;Mobility;Coordination;Flexibility;Sensation;FMC    Rehab Potential Fair    Clinical Decision Making Multiple treatment options, significant modification of task necessary    Comorbidities Affecting Occupational Performance: May have comorbidities impacting occupational performance    Modification or Assistance to Complete Evaluation  Min-Moderate modification of tasks or assist with assess necessary to complete eval    OT Frequency 2x / week    OT Duration --   for 5 weeks OR 10 visits over 10 weeks (if pt reduces down to 1x/wk)   OT Treatment/Interventions Self-care/ADL training;Moist Heat;Fluidtherapy;DME and/or AE instruction;Splinting;Therapeutic activities;Therapeutic exercise;Coping strategies training;Neuromuscular education;Functional Mobility Training;Passive range of motion;Patient/family education;Paraffin    Plan assess Box & Blocks and grip strength Rt hand and update goals, caregiver stretching program for LUE (focus on shoulder, elbow ext, pronation, wrist flex, and finger ext as able) and HEP for RUE for shoulder ROM, functional use RUE, and coordination Rt hand, consider splinting options for Lt hand    Consulted and Agree with Plan of Care Patient;Family member/caregiver    Family Member Consulted mother and daughter             Patient will benefit from skilled therapeutic intervention in order to improve the following deficits and impairments:   Body Structure / Function / Physical Skills: ADL, Decreased knowledge of use of DME, Strength, Pain, Dexterity, UE functional use, Body mechanics, Proprioception, IADL, Endurance, ROM, Improper spinal/pelvic alignment, Mobility, Coordination, Flexibility, Sensation, FMC       Visit Diagnosis: Other symptoms and signs involving the nervous system  Other symptoms and signs involving  the musculoskeletal system  Contracture of left hand  Muscle weakness (generalized)  Other lack of coordination    Problem List Patient Active Problem List   Diagnosis Date Noted   Finger osteomyelitis, left ([place]) 09/23/2019   Leukocytosis 09/23/2019    Zachery Conch, OT/L 07/26/2021, 4:38 PM  [facility] [address] [place], [place], [address] Phone: [phone]   Fax:  [phone]  Name: Nicklous  Hoopes MRN: [id number] Date of Birth: 05-15-1966

## 2021-07-27 DIAGNOSIS — G8252 Quadriplegia, C1-C4 incomplete: Secondary | ICD-10-CM | POA: Diagnosis not present

## 2021-07-27 NOTE — Therapy (Signed)
[facility] [address] [place], [place], [address] Phone: [phone]   Fax:  [phone]  Physical Therapy Treatment  Patient Details  Name: Carlos [name] MRN: [id number] Date of Birth: [birth year] Referring Provider (PT): Tommas Olp, MD   Encounter Date: 07/26/2021   PT End of Session - 07/26/21 1452     Visit Number 10    Number of Visits 19    Date for PT Re-Evaluation 08/30/21    Authorization Type [employer]-    Authorization Time Period 12 visit from 06/15/21- 08/07/21    Authorization - Visit Number 9    Authorization - Number of Visits 12    PT Start Time 2248    PT Stop Time 1530    PT Time Calculation (min) 41 min    Equipment Utilized During Treatment Other (comment)   Slideboard   Activity Tolerance Patient tolerated treatment well    Behavior During Therapy [facility] for tasks assessed/performed             Past Medical History:  Diagnosis Date   Crush injury to hand 12/2018   bilateral hands with multipe fractures   Finger osteomyelitis, 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   Subjective Assessment - 07/26/21 1451     Subjective No new complaints. No falls or pain to report.    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    Currently in Pain? No/denies                     [facility] Adult PT Treatment/Exercise - 07/26/21 1453       Transfers   Transfers Lateral/Scoot Transfers    Lateral/Scoot Transfers 4: Min assist;4: Min guard    Research officer, [employer] Details (indicate cue type and reason) wheelchair<>mat table. total assist to place board each time with pt able to weight shift away to allow for board placement. once board placed min assist to intiate the slide on to the board, then min guard assist for pt to complete the transfer. reminder cues needed for head/hips relationship. Inceased time needed to allow for maximal pt performance.      Exercises   Exercises Other Exercises    Other Exercises  seated at edge of mat with inverted chair with pillow behind pt: modified curl ups with no UE support for 10 reps; lateral elbow taps to mat table<>back up to sitting tall, alternating sides for 2 sets of 6 reps each side; with yogal block between knees- had pt squeeze block for 5 sec holds x 10 reps. cues for hold times needed.      Knee/Hip Exercises: Aerobic   Stepper SCI FIT stepper at level 3.5 x 8 minutes with LE only for extensor strengthening and endurance training,  cues for full ROM, used green theraband around legs for neutral positioning. anti-tippers behind wheels and therapist helping to steady w/c.                   PT Short Term Goals - 07/05/21 1409       PT SHORT TERM GOAL #1   Title Pt and pt's family will be independent with initial HEP for ROM/strengthening in order to build upon functional gains made in therapy. ALL STGS DUE 9/122    Baseline 07/05/21: has program that he is doing and still challenging.    Status Achieved    Target Date --      PT SHORT TERM GOAL #2   Title Pt will be able to perform sliding board transfer from w/c <> mat table with mod A in order to demo improved functional transfers to decr caregiver burden and transfer into his power w/c.     Baseline 07/05/21: max assist to initiate, the min guard to min assist, improved just not to goal level    Time --    Period --    Status Partially Met      PT SHORT TERM GOAL #3   Title Bed mobility to further be assessed with STG/LTG to be written.    Baseline see previous note from 06/28/21 for mobility details. PT to set goals as indicated.    Time --    Period --    Status Achieved      PT SHORT TERM GOAL #4   Title Pt will perform dynamic sitting tasks for at least 5 minutes with supervision in order to demo improved sitting tolerance for ADLs.    Baseline 07/05/21: met in session    Time --    Period --    Status Achieved      PT SHORT TERM GOAL #5   Title Pt will stand in standing frame x1 minute with mod A in order to demo functional weight bearing.    Baseline 07/05/21: met in session with Clarise Cruz Plus lift    Time --    Period Weeks    Status Achieved               PT Long Term Goals - 06/14/21 1656       PT LONG TERM GOAL #1   Title Bed mobility goal to be written as appropriate. ALL LTGS DUE 08/02/21    Baseline did not have time to perform at eval    Time 8    Period Weeks    Status New    Target Date 08/02/21      PT LONG TERM GOAL #2   Title Pt will perform sliding board transfers with min guard in order to demo improved functional transfers.    Baseline max A currently needed    Time 8    Period Weeks    Status New      PT LONG TERM GOAL #3   Title Pt will perform stand pivot transfers with mod A in order to demo improved functional mobility.    Baseline not yet assessed.    Time 8    Period Weeks    Status New      PT LONG TERM GOAL #4   Title Pt will be independent with mobility around obstacles and outdoor unlevel surfaces with power w/c in order to demo improved independence.    Baseline pt has not transferred into his power w/c yet for  use.    Time 8    Period Weeks    Status New      PT LONG TERM GOAL #5   Title Pt will stand at [facility]  with min/mod A for 1 minute in order to demo improved standing tolerance.    Baseline not yet assessed.    Time 8    Period Weeks    Status New                   Plan - 07/26/21 1452     Clinical Impression Statement Today's skilled session continued to focus on strengthening and transfer training. Rest breaks taken as needed. Pt continues to improve with slide board transfers. The pt is making progress and should benefit from continued PT to progress toward unmet goals.    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    Examination-Activity Limitations Bathing;Bed Mobility;Caring for Others;Locomotion Level;Squat;Dressing;Reach Overhead;Stand;Toileting;Transfers;Hygiene/Grooming;Lift    Examination-Participation Restrictions Cleaning;Community Activity;Laundry;Occupation;Driving;Meal Prep    Stability/Clinical Decision Making Unstable/Unpredictable    Rehab Potential Good    PT Frequency 2x / week   1-2x a week for 12 weeks   PT Duration 12 weeks   1-2x a week for 12 weeks   PT Treatment/Interventions ADLs/Self Care Home Management;Electrical Stimulation;DME Instruction;Gait training;Functional mobility training;Therapeutic activities;Neuromuscular re-education;Balance training;Therapeutic exercise;Patient/family education;Orthotic Fit/Training;Manual techniques;Passive range of motion;Energy conservation;Vestibular    PT Next Visit Plan SCI Fit in wheelchair with LE only for extension/endurance; continue WB strengthening for LE from elevated mat, sit > stand and standing with SARA plus. can try again with Stedy if have +2 assist and see if Thurmond Butts can move Stedy pads up.  Continue to work on increasing independence with slideboards.  seated strengthening.    Consulted and Agree with Plan of Care Patient;Family member/caregiver    Family Member Consulted pt's mom             Patient will benefit from skilled therapeutic intervention in order to improve the following deficits and impairments:  Decreased activity tolerance, Decreased balance, Decreased coordination, Decreased endurance, Decreased mobility, Decreased range of motion, Difficulty walking, Decreased strength, Hypomobility, Increased muscle spasms, Impaired flexibility, Impaired tone, Impaired sensation, Impaired UE functional use, Postural dysfunction  Visit Diagnosis: Abnormal posture  Other symptoms and signs involving the nervous system  Muscle weakness (generalized)     Problem List Patient Active Problem List   Diagnosis Date Noted   Finger osteomyelitis, left ([place]) 09/23/2019   Leukocytosis 09/23/2019    Willow Ora, PTA 07/27/2021, 6:18 PM  [facility] [address] [place], [place], [address] Phone: [phone]   Fax:  [phone]  Name: Carlos [name] MRN: [id number] Date of Birth: 26-Jan-1966

## 2021-07-30 DIAGNOSIS — G8252 Quadriplegia, C1-C4 incomplete: Secondary | ICD-10-CM | POA: Diagnosis not present

## 2021-07-31 ENCOUNTER — Ambulatory Visit: Payer: Medicaid Other | Admitting: Physical Therapy

## 2021-07-31 ENCOUNTER — Ambulatory Visit: Payer: Medicaid Other | Admitting: Occupational Therapy

## 2021-07-31 DIAGNOSIS — G8252 Quadriplegia, C1-C4 incomplete: Secondary | ICD-10-CM | POA: Diagnosis not present

## 2021-08-01 DIAGNOSIS — G8252 Quadriplegia, C1-C4 incomplete: Secondary | ICD-10-CM | POA: Diagnosis not present

## 2021-08-02 ENCOUNTER — Other Ambulatory Visit: Payer: Self-pay

## 2021-08-02 ENCOUNTER — Ambulatory Visit: Payer: Medicaid Other | Admitting: Occupational Therapy

## 2021-08-02 DIAGNOSIS — G8252 Quadriplegia, C1-C4 incomplete: Secondary | ICD-10-CM | POA: Diagnosis not present

## 2021-08-03 DIAGNOSIS — G8252 Quadriplegia, C1-C4 incomplete: Secondary | ICD-10-CM | POA: Diagnosis not present

## 2021-08-04 DIAGNOSIS — Z419 Encounter for procedure for purposes other than remedying health state, unspecified: Secondary | ICD-10-CM | POA: Diagnosis not present

## 2021-08-04 DIAGNOSIS — G8252 Quadriplegia, C1-C4 incomplete: Secondary | ICD-10-CM | POA: Diagnosis not present

## 2021-08-05 DIAGNOSIS — R2689 Other abnormalities of gait and mobility: Secondary | ICD-10-CM | POA: Diagnosis not present

## 2021-08-06 DIAGNOSIS — R269 Unspecified abnormalities of gait and mobility: Secondary | ICD-10-CM | POA: Diagnosis not present

## 2021-08-06 DIAGNOSIS — G959 Disease of spinal cord, unspecified: Secondary | ICD-10-CM | POA: Diagnosis not present

## 2021-08-06 DIAGNOSIS — Z1211 Encounter for screening for malignant neoplasm of colon: Secondary | ICD-10-CM | POA: Diagnosis not present

## 2021-08-06 DIAGNOSIS — G8252 Quadriplegia, C1-C4 incomplete: Secondary | ICD-10-CM | POA: Diagnosis not present

## 2021-08-06 DIAGNOSIS — C720 Malignant neoplasm of spinal cord: Secondary | ICD-10-CM | POA: Diagnosis not present

## 2021-08-06 DIAGNOSIS — D334 Benign neoplasm of spinal cord: Secondary | ICD-10-CM | POA: Diagnosis not present

## 2021-08-06 DIAGNOSIS — R200 Anesthesia of skin: Secondary | ICD-10-CM | POA: Diagnosis not present

## 2021-08-06 DIAGNOSIS — M5412 Radiculopathy, cervical region: Secondary | ICD-10-CM | POA: Diagnosis not present

## 2021-08-06 DIAGNOSIS — M2450 Contracture, unspecified joint: Secondary | ICD-10-CM | POA: Diagnosis not present

## 2021-08-06 DIAGNOSIS — K594 Anal spasm: Secondary | ICD-10-CM | POA: Diagnosis not present

## 2021-08-07 ENCOUNTER — Ambulatory Visit: Payer: Medicaid Other | Admitting: Occupational Therapy

## 2021-08-07 ENCOUNTER — Ambulatory Visit: Payer: Medicaid Other | Admitting: Physical Therapy

## 2021-08-07 DIAGNOSIS — G8252 Quadriplegia, C1-C4 incomplete: Secondary | ICD-10-CM | POA: Diagnosis not present

## 2021-08-08 DIAGNOSIS — G8252 Quadriplegia, C1-C4 incomplete: Secondary | ICD-10-CM | POA: Diagnosis not present

## 2021-08-09 ENCOUNTER — Ambulatory Visit: Payer: Medicaid Other | Admitting: Physical Therapy

## 2021-08-09 DIAGNOSIS — G8252 Quadriplegia, C1-C4 incomplete: Secondary | ICD-10-CM | POA: Diagnosis not present

## 2021-08-10 DIAGNOSIS — G8252 Quadriplegia, C1-C4 incomplete: Secondary | ICD-10-CM | POA: Diagnosis not present

## 2021-08-11 DIAGNOSIS — G8252 Quadriplegia, C1-C4 incomplete: Secondary | ICD-10-CM | POA: Diagnosis not present

## 2021-08-12 DIAGNOSIS — G8252 Quadriplegia, C1-C4 incomplete: Secondary | ICD-10-CM | POA: Diagnosis not present

## 2021-08-13 DIAGNOSIS — G8252 Quadriplegia, C1-C4 incomplete: Secondary | ICD-10-CM | POA: Diagnosis not present

## 2021-08-14 ENCOUNTER — Ambulatory Visit: Payer: Medicaid Other | Attending: Neurology | Admitting: Physical Therapy

## 2021-08-14 ENCOUNTER — Other Ambulatory Visit: Payer: Self-pay

## 2021-08-14 ENCOUNTER — Encounter: Payer: Self-pay | Admitting: Occupational Therapy

## 2021-08-14 ENCOUNTER — Ambulatory Visit: Payer: Medicaid Other | Admitting: Occupational Therapy

## 2021-08-14 ENCOUNTER — Encounter: Payer: Self-pay | Admitting: Physical Therapy

## 2021-08-14 DIAGNOSIS — R293 Abnormal posture: Secondary | ICD-10-CM | POA: Insufficient documentation

## 2021-08-14 DIAGNOSIS — R29898 Other symptoms and signs involving the musculoskeletal system: Secondary | ICD-10-CM

## 2021-08-14 DIAGNOSIS — M6281 Muscle weakness (generalized): Secondary | ICD-10-CM | POA: Insufficient documentation

## 2021-08-14 DIAGNOSIS — R262 Difficulty in walking, not elsewhere classified: Secondary | ICD-10-CM | POA: Diagnosis not present

## 2021-08-14 DIAGNOSIS — R278 Other lack of coordination: Secondary | ICD-10-CM | POA: Insufficient documentation

## 2021-08-14 DIAGNOSIS — R29818 Other symptoms and signs involving the nervous system: Secondary | ICD-10-CM | POA: Insufficient documentation

## 2021-08-14 DIAGNOSIS — M24542 Contracture, left hand: Secondary | ICD-10-CM | POA: Insufficient documentation

## 2021-08-14 DIAGNOSIS — G8252 Quadriplegia, C1-C4 incomplete: Secondary | ICD-10-CM | POA: Diagnosis not present

## 2021-08-14 NOTE — Therapy (Signed)
[facility] [address] [place], [place], [address] Phone: [phone]   Fax:  [phone]  Occupational Therapy Treatment  Patient Details  Name: Carlos [name] MRN: [id number] Date of Birth: 09-22-1966 No data recorded  Encounter Date: 08/14/2021   OT End of Session - 08/14/21 1622     Visit Number 3    Number of Visits 11    Date for OT Re-Evaluation --   up to 10 weeks out (10/06/21) if 1x/wk   Authorization Type [facility] - awaiting authorization, asking for 10 (P.T. 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   Subjective Assessment - 08/14/21 1622     Subjective  Pt reports no pain and denies any changes    Patient is accompanied by: Family member   son   Pertinent History spinal cord ependymoma diagnosed 09/2020, C3-C7 laminectomies on 11/23/20 to remove intradural intramedullary mass. Crush injuries bilateral hands 2 years ago w/ multiple fx's, Lt long finger partial amputation. Lt hand now min opening since neck surgery    Currently in Pain? No/denies                Worked on making adjustments to resting hand splint for LUE. Pt had botox since last session. Pt unable to get in resting  hand splint that was fitted last session and had to work on problem solving for best possible orthosis for maintaining range of motion and positioning for LUE.   Issued double blue foam for increasing positioning and maintaining palm protection and integrity in LUE.  Manual therapy for LUE for wrist and hand.  Will continue to work on determining best orthosis for LUE positioning.                    OT Short Term Goals - 07/24/21 1619       OT SHORT TERM GOAL #1   Title Caregiver independent w/ LUE stretching HEP    Baseline Not yet issued    Time 2    Period Weeks    Status New      OT SHORT TERM GOAL #2   Title Pt independent with RUE HEP for shoulder ROM, functional use, and hand coordination    Baseline Not yet issued    Time 2    Period Weeks    Status New      OT SHORT TERM GOAL #3   Title Caregiver independent with splint wear and care Lt hand    Baseline not yet issued    Time 2    Period Weeks    Status New  OT Long Term Goals - 07/24/21 1620       OT LONG TERM GOAL #1   Title Pt to improve Rt shoulder motion to brush 75% or more of hair, and assist w/ donning shirt w/ mod assist    Baseline dependent, sh flex to 65*, ER approx 60%    Time 5    Period Weeks    Status New      OT LONG TERM GOAL #2   Title Box & Blocks goal TBD to improve RUE function    Baseline not yet assessed    Time 5    Period Weeks    Status New      OT LONG TERM GOAL #3   Title Pt to perform toilet transfer using sliding board and drop arm BSC or Stedy lift w/ max assist x 1    Baseline unable - currently using bed pan in bed    Time 5    Period Weeks    Status New      OT LONG TERM GOAL #4   Title Pt/family to verbalize understanding with task modifications and AE/DME needs to increase participation and independence w/ ADLS    Baseline Dependent    Time 5    Period Weeks    Status New                   Plan - 08/14/21 1625      Clinical Impression Statement Continued to address positioning orthosis for LUE. Unable to get custom resting hand splint to fit appropriately for patient at this time.    OT Occupational Profile and History Detailed Assessment- Review of Records and additional review of physical, cognitive, psychosocial history related to current functional performance    Occupational performance deficits (Please refer to evaluation for details): ADL's;IADL's;Leisure;Social Participation    Body Structure / Function / Physical Skills ADL;Decreased knowledge of use of DME;Strength;Pain;Dexterity;UE functional use;Body mechanics;Proprioception;IADL;Endurance;ROM;Improper spinal/pelvic alignment;Mobility;Coordination;Flexibility;Sensation;FMC    Rehab Potential Fair    Clinical Decision Making Multiple treatment options, significant modification of task necessary    Comorbidities Affecting Occupational Performance: May have comorbidities impacting occupational performance    Modification or Assistance to Complete Evaluation  Min-Moderate modification of tasks or assist with assess necessary to complete eval    OT Frequency 2x / week    OT Duration --   for 5 weeks OR 10 visits over 10 weeks (if pt reduces down to 1x/wk)   OT Treatment/Interventions Self-care/ADL training;Moist Heat;Fluidtherapy;DME and/or AE instruction;Splinting;Therapeutic activities;Therapeutic exercise;Coping strategies training;Neuromuscular education;Functional Mobility Training;Passive range of motion;Patient/family education;Paraffin    Plan assess Box & Blocks and grip strength Rt hand and update goals, caregiver stretching program for LUE (focus on shoulder, elbow ext, pronation, wrist flex, and finger ext as able) and HEP for RUE for shoulder ROM, functional use RUE, and coordination Rt hand, consider splinting options for Lt hand    Consulted and Agree with Plan of Care Patient;Family member/caregiver    Family Member Consulted mother and  daughter             Patient will benefit from skilled therapeutic intervention in order to improve the following deficits and impairments:   Body Structure / Function / Physical Skills: ADL, Decreased knowledge of use of DME, Strength, Pain, Dexterity, UE functional use, Body mechanics, Proprioception, IADL, Endurance, ROM, Improper spinal/pelvic alignment, Mobility, Coordination, Flexibility, Sensation, FMC       Visit Diagnosis: Other symptoms and signs involving the nervous system  Other symptoms and signs involving the musculoskeletal system  Contracture of left hand  Muscle weakness (generalized)  Other lack of coordination    Problem List Patient Active Problem List   Diagnosis Date Noted   Finger osteomyelitis, left ([place]) 09/23/2019   Leukocytosis 09/23/2019    Zachery Conch, OT/L 08/14/2021, 4:53 PM  [facility] [address] [place], [place], [address] Phone: [phone]   Fax:  [phone]  Name: Carlos [name] MRN: [id number] Date of Birth: 17-Jun-1966

## 2021-08-14 NOTE — Therapy (Addendum)
[facility] [address] [place], [place], [address] Phone: [phone]   Fax:  [phone]  Physical Therapy Treatment/Re-Cert  Patient Details  Name: Carlos [name] MRN: [id number] Date of Birth: 1966/10/10 Referring Provider (PT): Tommas Olp, MD   Encounter Date: 08/14/2021   PT End of Session - 08/14/21 1737     Visit Number 11    Number of Visits 19    Date for PT Re-Evaluation 10/13/21    Authorization Type [employer]-awaiting additional auth    Authorization Time Period 12 visit from 06/15/21- 08/07/21    Authorization - Visit Number 10    Authorization - Number of Visits 12    PT Start Time 6701    PT Stop Time 1532    PT Time Calculation (min) 46 min    Equipment Utilized During Treatment Other (comment)   Slideboard, Clarise Cruz plus   Activity Tolerance Patient tolerated treatment well    Behavior During Therapy [facility] for tasks assessed/performed             Past Medical History:  Diagnosis Date   Crush injury to hand 12/2018   bilateral hands with multipe fractures   Finger osteomyelitis, 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   Subjective Assessment - 08/14/21 1448     Subjective Got Botox, feels that L hand is looser. Had a fall last week- his mom was trying to transfer him for his therapy appt and slid to the ground. EMS had to come and help him out.    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    Currently in Pain? No/denies                [facility] PT Assessment - 08/14/21 1450       Assessment   Medical Diagnosis spinal cord ependymoma    Referring Provider (PT) Tommas Olp, MD    Onset Date/Surgical Date 11/23/20    Prior [facility]      Prior Function   Level of Independence Independent                           [facility] PT Treatment/Exercise - 08/14/21 1450       Transfers   Transfers Lateral/Scoot Transfers    Sit to Stand 3: Mod assist;2: Max assist    Sit to Stand Details Verbal cues for sequencing;Verbal cues for technique;Manual facilitation for weight shifting;Manual facilitation for placement    Sit to Stand Details (indicate cue type and reason) use of Clarise Cruz plus with no foot plate or sling, verbal/manual cues for incr forward lean to stand    Stand to Sit 2: Max assist    Stand to  Sit Details from Clarise Cruz plus for controlled descent down to mat table    Lateral/Scoot Transfers 4: Min assist;4: Min guard    Lateral/Scoot Transfer Details (indicate cue type and reason) wheelchair<>mat table. Continues to need total assist to place board each time with pt able to weight shift away to allow for board placement. Once board placed min assist to intiate the transfer on to the board (when going from w/c > mat table), then min guard for remainder of transfer. When coming back from w/c > mat table, pt able to lean towards R to fully unweight buttocks for sliding board placement and performs transfer with min guard. Does need cues throughout for head/hips relationship.    Comments Performed standing x2 rep with Clarise Cruz Plus - first rep able to stand for 1 minute and 10 seconds - working on posture/standing tolerance and performing x10  reps glute squeezes. 2nd attempt pt able to stand for 30 seconds      Exercises   Exercises Other Exercises    Other Exercises  Prior to standing: performed seated hamstring stretch with PT putting pt's leg on her leg and providing gentle over pressure into knee extension 2 x30 seconds, bilat with cues for posture. Showed pt seated exercises that he can perform at home for HEP (did not get the chance to print out pictures due to time constraints but had pt perform and wrote down exercises): seated glute squeezes, LAQs with isometric hold x10 reps each leg, hip ADD pillow squeezes, heel <> toe raises.      Knee/Hip Exercises: Aerobic   Stepper SCI FIT stepper at level 3.5 x 8 minutes with LE only for extensor strengthening and endurance training, cues for full ROM, used gait belt around legs for neutral positioning. anti-tippers behind wheels and therapist helping to steady w/c.                     PT Education - 08/14/21 1736     Education Details Additions to HEP, Medicaid visit limit and POC going forwards - plan to do 1x week over 7-8 weeks to allow visits to go through remainder of calendar year vs. using them up more quickly if pt came 2x a week    Person(s) Educated Patient;Parent(s)    Methods Explanation    Comprehension Verbalized understanding              PT Short Term Goals - 07/05/21 1409       PT SHORT TERM GOAL #1   Title Pt and pt's family will be independent with initial HEP for ROM/strengthening in order to build upon functional gains made in therapy. ALL STGS DUE 9/122    Baseline 07/05/21: has program that he is doing and still challenging.    Status Achieved    Target Date --      PT SHORT TERM GOAL #2   Title Pt will be able to perform sliding board transfer from w/c <> mat table with mod A in order to demo improved functional transfers to decr caregiver burden and transfer into his power w/c.    Baseline 07/05/21: max assist to initiate, the min guard  to min assist, improved just not to goal level    Time --    Period --    Status Partially Met      PT SHORT TERM GOAL #3   Title Bed mobility to further be assessed with STG/LTG to be written.    Baseline  see previous note from 06/28/21 for mobility details. PT to set goals as indicated.    Time --    Period --    Status Achieved      PT SHORT TERM GOAL #4   Title Pt will perform dynamic sitting tasks for at least 5 minutes with supervision in order to demo improved sitting tolerance for ADLs.    Baseline 07/05/21: met in session    Time --    Period --    Status Achieved      PT SHORT TERM GOAL #5   Title Pt will stand in standing frame x1 minute with mod A in order to demo functional weight bearing.    Baseline 07/05/21: met in session with Clarise Cruz Plus lift    Time --    Period Weeks    Status Achieved            Revised STGs:   PT Short Term Goals - 08/15/21 1219       PT SHORT TERM GOAL #1   Title Pt will be able to stand at [facility] for 2 minutes with min guard in order to demo improved standing tolerance. ALL STGS DUE 09/12/21    Baseline 1 minute and 6 seconds on 08/14/21    Time 4    Period Weeks    Status New    Target Date 09/12/21      PT SHORT TERM GOAL #2   Title Pt will be able to perform sliding board transfer from w/c <> mat table with min guard in order to demo improved functional transfers to decr caregiver burden and transfer into his power w/c.    Baseline needs total A for sliding board placement, min A to initiate transfer and min guard for remainder of transfer    Time 4    Period Weeks    Status Revised      PT SHORT TERM GOAL #3   Title Pt and pt's family will be independent with exercise program for stretching/ROM in order to build upon functional gains made in therapy.    Baseline Has not been performing as consistently - added seated exercises for home.    Time 4    Period Weeks    Status New      PT SHORT TERM GOAL #4   Title --     Baseline --    Status --      PT SHORT TERM GOAL #5   Title --    Baseline --    Period --    Status --               PT Long Term Goals - 08/15/21 1206       PT LONG TERM GOAL #1   Title Pt will perform bed mobility with min A in order to demo decr caregiver burden. ALL LTGS DUE 08/02/21    Baseline needs mod A, did not have time to assess on 08/14/21 due to time constraints.    Time 8    Period Weeks    Status Deferred      PT LONG TERM GOAL #2   Title Pt will perform sliding board transfers with min guard in order to demo improved functional transfers.    Baseline needs total A for sliding board placement, min A to initiate transfer and min guard for remainder of transfer    Time 8    Period Weeks    Status Partially Met  PT LONG TERM GOAL #3   Title Pt will perform stand pivot transfers with mod A in order to demo improved functional mobility.    Baseline pt not able to stand without use of Clarise Cruz plus    Time 8    Period Weeks    Status Not Met      PT LONG TERM GOAL #4   Title Pt will be independent with mobility around obstacles and outdoor unlevel surfaces with power w/c in order to demo improved independence.    Baseline pt has not transferred into his power w/c yet for use.    Time 8    Period Weeks    Status Not Met      PT LONG TERM GOAL #5   Title Pt will stand at [facility] with min/mod A for 1 minute in order to demo improved standing tolerance.    Baseline able to stand at [facility] for 1 minute and 6 seconds with min/mod A    Time 8    Period Weeks    Status Partially Met            Revised LTGs:     PT Long Term Goals - 08/15/21 1222       PT LONG TERM GOAL #1   Title Pt will perform bed mobility with min A in order to demo decr caregiver burden. ALL LTGS DUE 10/10/21    Baseline needs mod A, did not have time to assess on 08/14/21 due to time constraints.    Time 8    Period Weeks    Status On-going    Target Date 10/10/21       PT LONG TERM GOAL #2   Title Pt will perform sliding board transfers with supervision/min guard in order to demo improved functional transfers.    Baseline needs total A for sliding board placement, min A to initiate transfer and min guard for remainder of transfer    Time 8    Period Weeks    Status Revised      PT LONG TERM GOAL #3   Title Pt will perform sit <> stand in [place] with max A in order to demo improved functional BLE strength for transfers.    Baseline needed total A+2    Time 8    Period Weeks    Status New      PT LONG TERM GOAL #4   Title Pt will be independent with mobility around obstacles and outdoor unlevel surfaces with power w/c in order to demo improved independence.    Baseline pt has not transferred into his power w/c yet for use.    Time 8    Period Weeks    Status On-going      PT LONG TERM GOAL #5   Title Pt will stand at [employer] vs. [facility] for 3 minutes in order to demo improved standing tolerance for ADLs.    Baseline able to stand at [facility] for 1 minute and 6 seconds    Time 8    Period Weeks    Status Revised                   Plan - 08/15/21 1208     Clinical Impression Statement Pt returns to PT today after a couple weeks due to missing appts due to being sick/transportation issues. Checked pt's LTGs today. Did not have time to check LTG #1 in regards to bed mobility due to time constraints.  Pt did not meet LTG #3 and #4 - pt note safe/unable to perform stand pivot transfers and pt has not yet been able to transfer into power w/c - pt not able to perform slide board transfers at home with family assist. Pt partially met LTG #2 and #5 - pt needs total A for sliding board placement, min A to initiate transfer and then min guard for remainder of transfer with pt continuing to need cues for head/hips relationship. Pt able to stand with Clarise Cruz Plus for 1 minute and 6 seconds before fatigue/incr pain at bilat knees. Pt needing max A to help stand.  Will request an additional 8 Medicaid visits (PT planning on using 17, OT planning using 10) to continue to work on strength, ROM, functional mobility/transfers, bed mobility, standing in order to improve functional mobility independence and decr fall risk.    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    Examination-Activity Limitations Bathing;Bed Mobility;Caring for Others;Locomotion Level;Squat;Dressing;Reach Overhead;Stand;Toileting;Transfers;Hygiene/Grooming;Lift    Examination-Participation Restrictions Cleaning;Community Activity;Laundry;Occupation;Driving;Meal Prep    Stability/Clinical Decision Making Unstable/Unpredictable    Rehab Potential Good    PT Frequency 1x / week    PT Duration 8 weeks   1-2x a week for 12 weeks   PT Treatment/Interventions ADLs/Self Care Home Management;Electrical Stimulation;DME Instruction;Gait training;Functional mobility training;Therapeutic activities;Neuromuscular re-education;Balance training;Therapeutic exercise;Patient/family education;Orthotic Fit/Training;Manual techniques;Passive range of motion;Energy conservation;Vestibular    PT Next Visit Plan bed mobility/supine exercises/stretching. SCI Fit in wheelchair with LE only for extension/endurance; continue WB strengthening for LE from elevated mat, sit > stand and standing with SARA plus.Continue to work on increasing independence with slideboards. seated strengthening.    Consulted and Agree with Plan of Care Patient;Family member/caregiver    Family Member Consulted pt's mom             Patient will benefit from skilled therapeutic intervention in order to improve the following deficits and impairments:  Decreased activity tolerance,  Decreased balance, Decreased coordination, Decreased endurance, Decreased mobility, Decreased range of motion, Difficulty walking, Decreased strength, Hypomobility, Increased muscle spasms, Impaired flexibility, Impaired tone, Impaired sensation, Impaired UE functional use, Postural dysfunction  Visit Diagnosis: Other symptoms and signs involving the nervous system  Muscle weakness (generalized)  Difficulty in walking, not elsewhere classified  Abnormal posture     Problem List Patient Active Problem List   Diagnosis Date Noted   Finger osteomyelitis, left ([place]) 09/23/2019   Leukocytosis 09/23/2019    Arliss Journey, PT, DPT  08/15/2021, 12:16 PM  [facility] [address] [place], [place], [address] Phone: [phone]   Fax:  [phone]  Name: Kodi Steil MRN: [id number] Date of Birth: 1966-07-10

## 2021-08-15 DIAGNOSIS — G8252 Quadriplegia, C1-C4 incomplete: Secondary | ICD-10-CM | POA: Diagnosis not present

## 2021-08-15 NOTE — Addendum Note (Signed)
Addended by: Arliss Journey on: 08/15/2021 12:25 PM   Modules accepted: Orders

## 2021-08-16 DIAGNOSIS — G8252 Quadriplegia, C1-C4 incomplete: Secondary | ICD-10-CM | POA: Diagnosis not present

## 2021-08-17 DIAGNOSIS — G8252 Quadriplegia, C1-C4 incomplete: Secondary | ICD-10-CM | POA: Diagnosis not present

## 2021-08-18 DIAGNOSIS — G8252 Quadriplegia, C1-C4 incomplete: Secondary | ICD-10-CM | POA: Diagnosis not present

## 2021-08-19 DIAGNOSIS — G8252 Quadriplegia, C1-C4 incomplete: Secondary | ICD-10-CM | POA: Diagnosis not present

## 2021-08-20 DIAGNOSIS — G8252 Quadriplegia, C1-C4 incomplete: Secondary | ICD-10-CM | POA: Diagnosis not present

## 2021-08-21 ENCOUNTER — Ambulatory Visit: Payer: Medicaid Other | Admitting: Physical Therapy

## 2021-08-21 ENCOUNTER — Ambulatory Visit: Payer: Medicaid Other | Admitting: Occupational Therapy

## 2021-08-21 DIAGNOSIS — G8252 Quadriplegia, C1-C4 incomplete: Secondary | ICD-10-CM | POA: Diagnosis not present

## 2021-08-22 DIAGNOSIS — G8252 Quadriplegia, C1-C4 incomplete: Secondary | ICD-10-CM | POA: Diagnosis not present

## 2021-08-23 DIAGNOSIS — G8252 Quadriplegia, C1-C4 incomplete: Secondary | ICD-10-CM | POA: Diagnosis not present

## 2021-08-24 DIAGNOSIS — G8252 Quadriplegia, C1-C4 incomplete: Secondary | ICD-10-CM | POA: Diagnosis not present

## 2021-08-25 DIAGNOSIS — G8252 Quadriplegia, C1-C4 incomplete: Secondary | ICD-10-CM | POA: Diagnosis not present

## 2021-08-26 DIAGNOSIS — G8252 Quadriplegia, C1-C4 incomplete: Secondary | ICD-10-CM | POA: Diagnosis not present

## 2021-08-27 DIAGNOSIS — G8252 Quadriplegia, C1-C4 incomplete: Secondary | ICD-10-CM | POA: Diagnosis not present

## 2021-08-28 ENCOUNTER — Other Ambulatory Visit: Payer: Self-pay

## 2021-08-28 ENCOUNTER — Ambulatory Visit: Payer: Medicaid Other | Admitting: Physical Therapy

## 2021-08-28 ENCOUNTER — Ambulatory Visit: Payer: Medicaid Other | Admitting: Occupational Therapy

## 2021-08-28 ENCOUNTER — Encounter: Payer: Self-pay | Admitting: Occupational Therapy

## 2021-08-28 DIAGNOSIS — M6281 Muscle weakness (generalized): Secondary | ICD-10-CM

## 2021-08-28 DIAGNOSIS — R29818 Other symptoms and signs involving the nervous system: Secondary | ICD-10-CM

## 2021-08-28 DIAGNOSIS — R262 Difficulty in walking, not elsewhere classified: Secondary | ICD-10-CM

## 2021-08-28 DIAGNOSIS — G8252 Quadriplegia, C1-C4 incomplete: Secondary | ICD-10-CM | POA: Diagnosis not present

## 2021-08-28 DIAGNOSIS — M24542 Contracture, left hand: Secondary | ICD-10-CM

## 2021-08-28 DIAGNOSIS — R278 Other lack of coordination: Secondary | ICD-10-CM | POA: Diagnosis not present

## 2021-08-28 DIAGNOSIS — R29898 Other symptoms and signs involving the musculoskeletal system: Secondary | ICD-10-CM

## 2021-08-28 DIAGNOSIS — R293 Abnormal posture: Secondary | ICD-10-CM | POA: Diagnosis not present

## 2021-08-28 NOTE — Therapy (Signed)
[facility] [address] [place], [place], [address] Phone: [phone]   Fax:  [phone]  Occupational Therapy Treatment  Patient Details  Name: Carlos [name] MRN: [id number] Date of Birth: 09/08/1966 No data recorded  Encounter Date: 08/28/2021   OT End of Session - 08/28/21 1540     Visit Number 4    Number of Visits 11    Date for OT Re-Evaluation --   up to 10 weeks out (10/06/21) if 1x/wk   Authorization Type [facility] - awaiting authorization, asking for 10 (P.T. 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   Subjective Assessment - 08/28/21 1538     Subjective  Pt reports being sick last week.    Patient is accompanied by: Family member   son   Pertinent History spinal cord ependymoma diagnosed 09/2020, C3-C7 laminectomies on 11/23/20 to remove intradural intramedullary mass. Crush injuries bilateral hands 2 years ago w/ multiple fx's, Lt long finger partial amputation. Lt hand now min opening since neck surgery    Currently in Pain? Yes    Pain Score 2     Pain Location Back    Pain Orientation Lower    Pain Descriptors / Indicators Aching;Tightness    Pain Type Acute pain    Pain Onset In the  past 7 days    Pain Frequency Intermittent    Aggravating Factors  tightness from being in bed                [facility] OT Assessment - 08/28/21 0001       Coordination   Box and Blocks R 10 blocks             Manual Therapy PROM to LUE wrist and hand. Pt very limited with range of motion and demonstrates contractures in LUE hand - specifically at MP joint.  Self PROM - challenging on able d/t limied strength in RUE.   Coordination with RUE with color dowels - grasp, sustained grasp and release with removing w min difficulty. Encouraged to take time for more normal grasp pattern. Placing dowel into board with max difficulty.                  OT Short Term Goals - 08/28/21 1546       OT SHORT TERM GOAL #1   Title Caregiver independent w/ LUE stretching HEP    Baseline Not yet issued    Time 2    Period Weeks    Status On-going      OT SHORT TERM GOAL #2   Title Pt independent with RUE  HEP for shoulder ROM, functional use, and hand coordination    Baseline Not yet issued    Time 2    Period Weeks    Status New      OT SHORT TERM GOAL #3   Title Caregiver independent with splint wear and care Lt hand    Baseline not yet issued    Time 2    Period Weeks    Status On-going               OT Long Term Goals - 08/28/21 1545       OT LONG TERM GOAL #1   Title Pt to improve Rt shoulder motion to brush 75% or more of hair, and assist w/ donning shirt w/ mod assist    Baseline dependent, sh flex to 65*, ER approx 60%    Time 5    Period Weeks    Status New      OT LONG TERM GOAL #2   Title Pt will score 15 blocks or greater with RUE for increasing functional use of RUE.    Baseline R 10 blocks    Time 5    Period Weeks    Status Revised      OT LONG TERM GOAL #3   Title Pt to perform toilet transfer using sliding board and drop arm BSC or Stedy lift w/ max assist x 1    Baseline unable - currently using bed pan in bed    Time 5    Period  Weeks    Status New      OT LONG TERM GOAL #4   Title Pt/family to verbalize understanding with task modifications and AE/DME needs to increase participation and independence w/ ADLS    Baseline Dependent    Time 5    Period Weeks    Status New                    Patient will benefit from skilled therapeutic intervention in order to improve the following deficits and impairments:           Visit Diagnosis: Other symptoms and signs involving the nervous system  Other symptoms and signs involving the musculoskeletal system  Contracture of left hand  Other lack of coordination  Muscle weakness (generalized)    Problem List Patient Active Problem List   Diagnosis Date Noted   Finger osteomyelitis, left ([place]) 09/23/2019   Leukocytosis 09/23/2019    Zachery Conch, OT/L 08/28/2021, 3:59 PM  [facility] [address] [place], [place], [address] Phone: [phone]   Fax:  [phone]  Name: Carlos [name] MRN: [id number] Date of Birth: 1966/05/29

## 2021-08-29 DIAGNOSIS — G8252 Quadriplegia, C1-C4 incomplete: Secondary | ICD-10-CM | POA: Diagnosis not present

## 2021-08-29 NOTE — Therapy (Signed)
[facility] [address] [place], [place], [address] Phone: [phone]   Fax:  [phone]  Physical Therapy Treatment  Patient Details  Name: Carlos [name] MRN: [id number] Date of Birth: [birth year] Referring Provider (PT): Tommas Olp, MD   Encounter Date: 08/28/2021   PT End of Session - 08/29/21 1239     Visit Number 12    Number of Visits 20    Date for PT Re-Evaluation 10/09/21    Authorization Type [employer]    Authorization Time Period 8 visits from 08/09/21 - 10/09/2021    Authorization - Visit Number 1    Authorization - Number of Visits 8    PT Start Time 1450    PT Stop Time 1535    PT Time Calculation (min) 45 min    Equipment Utilized During Treatment Other (comment)   Slideboard, Clarise Cruz plus   Activity Tolerance Patient limited by pain    Behavior During Therapy [facility] for tasks assessed/performed             Past Medical History:  Diagnosis Date   Crush injury to hand 12/2018   bilateral hands with multipe fractures   Finger osteomyelitis, left ([place]) 12/2018    Past Surgical History:  Procedure Laterality Date   I & D EXTREMITY Left 09/23/2019   Procedure: IRRIGATION AND DEBRIDEMENT OF  LEFT LONG FINGER, REVISION OF LEFT LONG FINGER AMPUTATION;  Surgeon: Verner Mould, MD;  Location: [place];  Service: Orthopedics;  Laterality: Left;   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   Subjective Assessment - 08/28/21 1451     Subjective Was sick with some stomach issues last week.  Goes to see GI and urology next week.  Tried to do some stretches and exercises in the bed last week.  Did a lot of bridges.  Having some tightness in stomach.    Patient is accompained by: Family member   son [name]   Pertinent History Had posterior C3-C7 laminectomies for excision of intradural intramedullary mass on 11/23/2020. 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    Currently in Pain? No/denies               [facility] Adult PT Treatment/Exercise - 08/29/21 1255       Transfers   Transfers Lateral/Scoot Transfers;Stand to Sit;Sit to Stand    Sit to Stand 2: Max assist    Sit to Stand Details (indicate cue type and reason) use of SARA plus and green harness to assist with bringing trunk and COG more anterior over BOS during sit > stand and during standing.  Performed x 2; first stand pt only able to perform weight shifting due to increased stretching in shoulder, hips, knees and ankles.  Second stand pt able to perform active hip and knee extension and maintain x 10 seconds x 5 reps.  Unable to take a step with either foot today.    Stand to Sit 2: Max assist    Lateral/Scoot Transfers 4: Min guard    Lateral/Scoot Transfer Details (indicate cue type and reason) w/c <> mat with cues to push through UE including L fist, cues for head-hips relationship and therapist provided assistance with LE positioning and blocking knees to prevent  sliding off board.    Transfer via Ambulance person      Knee/Hip Exercises: Aerobic   Stepper SCI FIT stepper at level 2.0 x 6 minutes with LE only for extensor strengthening and endurance training, cues for full ROM through hip and knee extension and cues for use of hip muscles to keep hips in neutral rotation.                PT Education - 08/29/21 1238     Education Details continued to educate on purpose of spreading out visits due to limited number and importance of standing each visit, even if 1x/week, benefits of standing    Person(s) Educated Patient    Methods Explanation    Comprehension Verbalized understanding              PT Short Term Goals - 08/15/21 1219       PT SHORT TERM GOAL #1   Title Pt will be able to stand  at [facility] for 2 minutes with min guard in order to demo improved standing tolerance. ALL STGS DUE 09/12/21    Baseline 1 minute and 6 seconds on 08/14/21    Time 4    Period Weeks    Status New    Target Date 09/12/21      PT SHORT TERM GOAL #2   Title Pt will be able to perform sliding board transfer from w/c <> mat table with min guard in order to demo improved functional transfers to decr caregiver burden and transfer into his power w/c.    Baseline needs total A for sliding board placement, min A to initiate transfer and min guard for remainder of transfer    Time 4    Period Weeks    Status Revised      PT SHORT TERM GOAL #3   Title Pt and pt's family will be independent with exercise program for stretching/ROM in order to build upon functional gains made in therapy.    Baseline Has not been performing as consistently - added seated exercises for home.    Time 4    Period Weeks    Status New      PT SHORT TERM GOAL #4   Title --    Baseline --    Status --      PT SHORT TERM GOAL #5   Title --    Baseline --    Period --    Status --               PT Long Term Goals - 08/15/21 1222       PT LONG TERM GOAL #1   Title Pt will perform bed mobility with min A in order to demo decr caregiver burden. ALL LTGS DUE 10/10/21    Baseline needs mod A, did not have time to assess on 08/14/21 due to time constraints.    Time 8    Period Weeks    Status On-going    Target Date 10/10/21      PT LONG TERM GOAL #2   Title Pt will perform sliding board transfers with supervision/min guard in order to demo improved functional transfers.    Baseline needs total A for sliding board placement, min A to initiate transfer and min guard for remainder of transfer    Time 8    Period Weeks    Status Revised      PT LONG TERM GOAL #3  Title Pt will perform sit <> stand in [place] with max A in order to demo improved functional BLE strength for transfers.    Baseline needed total  A+2    Time 8    Period Weeks    Status New      PT LONG TERM GOAL #4   Title Pt will be independent with mobility around obstacles and outdoor unlevel surfaces with power w/c in order to demo improved independence.    Baseline pt has not transferred into his power w/c yet for use.    Time 8    Period Weeks    Status On-going      PT LONG TERM GOAL #5   Title Pt will stand at [employer] vs. Charlaine Dalton for 3 minutes in order to demo improved standing tolerance for ADLs.    Baseline able to stand at [facility] for 1 minute and 6 seconds    Time 8    Period Weeks    Status Revised                   Plan - 08/29/21 1240     Clinical Impression Statement Pt continues to have missed visits due to illness and presented with increased pain and decreased ROM In low back, shoulder, elbow, hand, hips, knees and ankles.  Despite pain and decreased ROM pt continued to demonstrate ability to perform slideboard transfers with close supervision-min guard and performed sit > stand with total A of SARA.  During first stand pt required increased time to allow shoulder, hips, knees and ankle muscles to elongate; second sit > stand pt able to focus on activation of LE extensors.  Pain and ROM improved by end of session.  Will continue to perform standing, WB and functional muscle lengthening in order to progress towards goals.    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    Examination-Activity Limitations Bathing;Bed Mobility;Caring for Others;Locomotion Level;Squat;Dressing;Reach Overhead;Stand;Toileting;Transfers;Hygiene/Grooming;Lift    Examination-Participation Restrictions Cleaning;Community Activity;Laundry;Occupation;Driving;Meal Prep     Stability/Clinical Decision Making Unstable/Unpredictable    Rehab Potential Good    PT Frequency 1x / week    PT Duration 8 weeks   1-2x a week for 12 weeks   PT Treatment/Interventions ADLs/Self Care Home Management;Electrical Stimulation;DME Instruction;Gait training;Functional mobility training;Therapeutic activities;Neuromuscular re-education;Balance training;Therapeutic exercise;Patient/family education;Orthotic Fit/Training;Manual techniques;Passive range of motion;Energy conservation;Vestibular    PT Next Visit Plan Continue to use slideboard but focus on using LUE to push and lifting hips as he scoots.  May need to warm up with SCI Fit - LE and RUE from w/c.  Standing with SARA and Medium GREEN sling around back to assist with bringing COG over BOS.  Work on weight shifting, LE activation in standing and possibly taking steps (may have to remove knee block).    Consulted and Agree with Plan of Care Patient;Family member/caregiver    Family Member Consulted pt's mom             Patient will benefit from skilled therapeutic intervention in order to improve the following deficits and impairments:  Decreased activity tolerance, Decreased balance, Decreased coordination, Decreased endurance, Decreased mobility, Decreased range of motion, Difficulty walking, Decreased strength, Hypomobility, Increased muscle spasms, Impaired flexibility, Impaired tone, Impaired sensation, Impaired UE functional use, Postural dysfunction  Visit Diagnosis: Other symptoms and signs  involving the nervous system  Other symptoms and signs involving the musculoskeletal system  Muscle weakness (generalized)  Difficulty in walking, not elsewhere classified  Abnormal posture     Problem List Patient Active Problem List   Diagnosis Date Noted   Finger osteomyelitis, left ([place]) 09/23/2019   Leukocytosis 09/23/2019   Rico Junker, PT, DPT 08/29/21    1:03 PM    [facility] [address] [place], [place], [address] Phone: [phone]   Fax:  [phone]  Name: Verlon Pischke MRN: [id number] Date of Birth: [birth year]

## 2021-08-30 DIAGNOSIS — G8252 Quadriplegia, C1-C4 incomplete: Secondary | ICD-10-CM | POA: Diagnosis not present

## 2021-08-31 DIAGNOSIS — G8252 Quadriplegia, C1-C4 incomplete: Secondary | ICD-10-CM | POA: Diagnosis not present

## 2021-09-02 DIAGNOSIS — G8252 Quadriplegia, C1-C4 incomplete: Secondary | ICD-10-CM | POA: Diagnosis not present

## 2021-09-03 DIAGNOSIS — G8252 Quadriplegia, C1-C4 incomplete: Secondary | ICD-10-CM | POA: Diagnosis not present

## 2021-09-04 ENCOUNTER — Ambulatory Visit: Payer: Medicaid Other | Admitting: Occupational Therapy

## 2021-09-04 ENCOUNTER — Ambulatory Visit: Payer: Medicaid Other | Admitting: Physical Therapy

## 2021-09-04 DIAGNOSIS — G8252 Quadriplegia, C1-C4 incomplete: Secondary | ICD-10-CM | POA: Diagnosis not present

## 2021-09-04 DIAGNOSIS — Z419 Encounter for procedure for purposes other than remedying health state, unspecified: Secondary | ICD-10-CM | POA: Diagnosis not present

## 2021-09-05 ENCOUNTER — Ambulatory Visit: Payer: Medicaid Other | Attending: Neurology | Admitting: Occupational Therapy

## 2021-09-05 ENCOUNTER — Other Ambulatory Visit: Payer: Self-pay

## 2021-09-05 ENCOUNTER — Encounter: Payer: Self-pay | Admitting: Physical Therapy

## 2021-09-05 ENCOUNTER — Ambulatory Visit: Payer: Medicaid Other | Admitting: Physical Therapy

## 2021-09-05 ENCOUNTER — Encounter: Payer: Self-pay | Admitting: Occupational Therapy

## 2021-09-05 DIAGNOSIS — R29898 Other symptoms and signs involving the musculoskeletal system: Secondary | ICD-10-CM | POA: Diagnosis not present

## 2021-09-05 DIAGNOSIS — R29818 Other symptoms and signs involving the nervous system: Secondary | ICD-10-CM | POA: Diagnosis not present

## 2021-09-05 DIAGNOSIS — R278 Other lack of coordination: Secondary | ICD-10-CM | POA: Insufficient documentation

## 2021-09-05 DIAGNOSIS — R293 Abnormal posture: Secondary | ICD-10-CM | POA: Diagnosis not present

## 2021-09-05 DIAGNOSIS — M6281 Muscle weakness (generalized): Secondary | ICD-10-CM | POA: Diagnosis not present

## 2021-09-05 DIAGNOSIS — M24542 Contracture, left hand: Secondary | ICD-10-CM | POA: Insufficient documentation

## 2021-09-05 DIAGNOSIS — G8252 Quadriplegia, C1-C4 incomplete: Secondary | ICD-10-CM | POA: Diagnosis not present

## 2021-09-05 NOTE — Therapy (Signed)
[facility] [address] [place], [place], [address] Phone: [phone]   Fax:  [phone]  Occupational Therapy Treatment  Patient Details  Name: Carlos [name] MRN: [id number] Date of Birth: 03/28/1966 No data recorded  Encounter Date: 09/05/2021   OT End of Session - 09/05/21 1107     Visit Number 5    Number of Visits 11    Date for OT Re-Evaluation 10/06/21   up to 10 weeks out (10/06/21) if 1x/wk   Authorization Type [facility] - awaiting authorization, asking for 10 (P.T. to use 17)    Authorization Time Period 10 visits (07/26/21 - 09/11/21) - resubmitted/email John for more visits 09/05/21 and got scheduled for more visits.    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   Subjective Assessment - 09/05/21 1109     Subjective  Pt denies any pain.    Patient is accompanied by: Family member   son   Pertinent History spinal cord ependymoma diagnosed 09/2020, C3-C7 laminectomies on 11/23/20 to remove intradural intramedullary mass. Crush injuries bilateral hands 2 years ago w/ multiple fx's, Lt long finger partial amputation. Lt hand now min opening since neck surgery     Currently in Pain? No/denies    Pain Score 0-No pain              PROM for caregivers and HEP - see pt instructions  Resistance Clothespins with RUE with increased reach. Pt with mod compensations for RUE shoulder flexion. Pt able to reach mid level. Freq breaks d/t fatigue in RUE shoulder.    Manual Therapy to LUE with PROM to hand and wrist.                 OT Education - 09/05/21 1131     Education Details demonstrated passive stretching for LUE to mother of patient and sent handout for daughter to complete. Continue to review. Access Code: [email or web address]    Person(s) Educated Patient;Parent(s)    Methods Explanation;Demonstration    Comprehension Verbalized understanding;Need further instruction              OT Short Term Goals - 08/28/21 1546       OT SHORT TERM GOAL #1   Title Caregiver independent w/ LUE stretching HEP    Baseline Not yet issued    Time 2    Period Weeks    Status On-going      OT SHORT TERM GOAL #2   Title Pt independent with RUE HEP for shoulder ROM, functional use, and hand coordination    Baseline  Not yet issued    Time 2    Period Weeks    Status New      OT SHORT TERM GOAL #3   Title Caregiver independent with splint wear and care Lt hand    Baseline not yet issued    Time 2    Period Weeks    Status On-going               OT Long Term Goals - 08/28/21 1545       OT LONG TERM GOAL #1   Title Pt to improve Rt shoulder motion to brush 75% or more of hair, and assist w/ donning shirt w/ mod assist    Baseline dependent, sh flex to 65*, ER approx 60%    Time 5    Period Weeks    Status New      OT LONG TERM GOAL #2   Title Pt will score 15 blocks or greater with RUE for increasing functional use of RUE.    Baseline R 10 blocks    Time 5    Period Weeks    Status Revised      OT LONG TERM GOAL #3   Title Pt to perform toilet transfer using sliding board and drop arm BSC or Stedy lift w/ max assist x 1     Baseline unable - currently using bed pan in bed    Time 5    Period Weeks    Status New      OT LONG TERM GOAL #4   Title Pt/family to verbalize understanding with task modifications and AE/DME needs to increase participation and independence w/ ADLS    Baseline Dependent    Time 5    Period Weeks    Status New                   Plan - 09/05/21 1258     Clinical Impression Statement Continue to progress with RUE range of motion and functional use and LUE range of motion and NMR.    OT Occupational Profile and History Detailed Assessment- Review of Records and additional review of physical, cognitive, psychosocial history related to current functional performance    Occupational performance deficits (Please refer to evaluation for details): ADL's;IADL's;Leisure;Social Participation    Body Structure / Function / Physical Skills ADL;Decreased knowledge of use of DME;Strength;Pain;Dexterity;UE functional use;Body mechanics;Proprioception;IADL;Endurance;ROM;Improper spinal/pelvic alignment;Mobility;Coordination;Flexibility;Sensation;FMC    Rehab Potential Fair    Clinical Decision Making Multiple treatment options, significant modification of task necessary    Comorbidities Affecting Occupational Performance: May have comorbidities impacting occupational performance    Modification or Assistance to Complete Evaluation  Min-Moderate modification of tasks or assist with assess necessary to complete eval    OT Frequency 2x / week    OT Duration --   for 5 weeks OR 10 visits over 10 weeks (if pt reduces down to 1x/wk)   OT Treatment/Interventions Self-care/ADL training;Moist Heat;Fluidtherapy;DME and/or AE instruction;Splinting;Therapeutic activities;Therapeutic exercise;Coping strategies training;Neuromuscular education;Functional Mobility Training;Passive range of motion;Patient/family education;[place]    Plan caregiver stretching program for LUE (focus on shoulder, elbow ext,  pronation, wrist flex, and finger ext as able) and HEP for RUE for shoulder ROM, functional use RUE, and coordination Rt hand, consider splinting options for Lt hand    Consulted and Agree with Plan of Care Patient;Family member/caregiver    Family Member Consulted mother and daughter             Patient will benefit  from skilled therapeutic intervention in order to improve the following deficits and impairments:   Body Structure / Function / Physical Skills: ADL, Decreased knowledge of use of DME, Strength, Pain, Dexterity, UE functional use, Body mechanics, Proprioception, IADL, Endurance, ROM, Improper spinal/pelvic alignment, Mobility, Coordination, Flexibility, Sensation, [facility]       Visit Diagnosis: Other symptoms and signs involving the nervous system  Other symptoms and signs involving the musculoskeletal system  Contracture of left hand  Other lack of coordination  Muscle weakness (generalized)    Problem List Patient Active Problem List   Diagnosis Date Noted   Finger osteomyelitis, left ([place]) 09/23/2019   Leukocytosis 09/23/2019    Zachery Conch, OT/L 09/05/2021, 1:05 PM  [facility] [address] [place], [place], [address] Phone: [phone]   Fax:  [phone]  Name: Carlos [name] MRN: [id number] Date of Birth: [birth year]

## 2021-09-05 NOTE — Patient Instructions (Signed)
Access Code: [email or web address] URL: [email or web address] Date: 09/05/2021 Prepared by: Waldo Laine  Exercises Elbow Extension with Caregiver - 1 x daily - 7 x weekly - 3 sets - 10 reps Wrist Flexion Stretch with Caregiver - 1 x daily - 7 x weekly - 3 sets - 10 reps Shoulder Flexion Caregiver PROM - 1 x daily - 7 x weekly - 3 sets - 10 reps Forearm Pronation and Supination Caregiver PROM - 1 x daily - 7 x weekly - 3 sets - 10 reps Elbow Flexion and Extension Caregiver PROM - 1 x daily - 7 x weekly - 3 sets - 10 reps Supine Shoulder External Rotation and Internal Rotation PROM with Caregiver - 1 x daily - 7 x weekly - 3 sets - 10 reps Shoulder Abduction Caregiver PROM - 1 x daily - 7 x weekly - 3 sets - 10 reps Finger Extension with Wrist Flexion Caregiver PROM - 1 x daily - 7 x weekly - 3 sets - 10 reps

## 2021-09-06 DIAGNOSIS — N5319 Other ejaculatory dysfunction: Secondary | ICD-10-CM | POA: Diagnosis not present

## 2021-09-06 DIAGNOSIS — Z7689 Persons encountering health services in other specified circumstances: Secondary | ICD-10-CM | POA: Diagnosis not present

## 2021-09-06 DIAGNOSIS — G8252 Quadriplegia, C1-C4 incomplete: Secondary | ICD-10-CM | POA: Diagnosis not present

## 2021-09-06 NOTE — Therapy (Signed)
[facility] [address] [place], [place], [address] Phone: [phone]   Fax:  [phone]  Physical Therapy Treatment  Patient Details  Name: Carlos [name] MRN: [id number] Date of Birth: 06-Sep-1966 Referring Provider (PT): Tommas Olp, MD   Encounter Date: 09/05/2021   PT End of Session - 09/05/21 1235     Visit Number 13    Number of Visits 20    Date for PT Re-Evaluation 10/09/21    Authorization Type [employer]    Authorization Time Period 8 visits from 08/09/21 - 10/09/2021    Authorization - Visit Number 2    Authorization - Number of Visits 8    PT Start Time 1232    PT Stop Time 1324    PT Time Calculation (min) 43 min    Equipment Utilized During Treatment Other (comment)   Slideboard, Clarise Cruz plus   Activity Tolerance Patient limited by pain;Patient tolerated treatment well    Behavior During Therapy [facility] for tasks assessed/performed             Past Medical History:  Diagnosis Date   Crush injury to hand 12/2018   bilateral hands with multipe fractures   Finger osteomyelitis, 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   Subjective Assessment - 09/05/21 1234     Subjective Feeling better this week. Has been doing his seated exercises at home when he can sit up.    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    Currently in Pain? No/denies                               [facility] Adult PT Treatment/Exercise - 09/06/21 0943       Transfers   Transfers Lateral/Scoot Transfers;Stand to Sit;Sit to Stand    Sit to Stand 2: Max assist    Sit to Stand Details (indicate cue type and reason) Use of Clarise Cruz plus with no foot plate and green sling. When standing, cues from therapist for bringing COG more anterior to stand. Performed x3 stands today. First stand for 2 minutes performed x10 reps glute squeezes for 5 seconds each and cued for standing tall, total 2 minutes.2nd stand worked on lateral weight shifting with assist from therapist at hips. Attempted to weight shift and lift a foot/heel, pt able to perform x2 reps when weight shifting to R and gently lifting LLE off ground for 1 second. Pt unable to tolerate weight shift to L, reporting incr shoulder pain, performed x2 minutes total standing. With 3rd rep, pt only able to tolerate standing for 30 seconds with working on weight shifting laterally.    Stand to Sit 2: Max assist    Stand to Sit Details  Cues and assist from therapist to help sit bottom back ont onto mat table    Lateral/Scoot Transfers 4: Min guard;4: Min assist    Lateral/Scoot Transfer Details (indicate cue type and reason) wheelchair <> mat table, needs initial min A to help get on sliding board, min guard for remainder of transfer. Cues for head hips relationship and therapist helping with BLE positioning    Comments Once pt in w/c after sliding board transfer from mat, needing mod/max A to help scoot back into manual w/c      Knee/Hip Exercises: Aerobic   Stepper SCI FIT stepper at level 2.0 x 6 minutes with LE only for extensor strengthening and endurance training, cues for full ROM through hip and knee extension and cues for use of hip muscles to keep hips in neutral rotation. Anti-tippers  behind w/c and therapist helping to steady. Performed before transfers/standing today as a warm up                       PT Short Term Goals - 08/15/21 1219       PT SHORT TERM GOAL #1   Title Pt will be able to stand at [facility] for 2 minutes with min guard in order to demo improved standing tolerance. ALL STGS DUE 09/12/21    Baseline 1 minute and 6 seconds on 08/14/21    Time 4    Period Weeks    Status New    Target Date 09/12/21      PT SHORT TERM GOAL #2   Title Pt will be able to perform sliding board transfer from w/c <> mat table with min guard in order to demo improved functional transfers to decr caregiver burden and transfer into his power w/c.    Baseline needs total A for sliding board placement, min A to initiate transfer and min guard for remainder of transfer    Time 4    Period Weeks    Status Revised      PT SHORT TERM GOAL #3   Title Pt and pt's family will be independent with exercise program for stretching/ROM in order to build upon functional gains made in therapy.    Baseline Has not been performing as consistently - added seated exercises for home.    Time 4    Period Weeks    Status New      PT SHORT TERM GOAL #4   Title --    Baseline --    Status --      PT SHORT TERM GOAL #5   Title --    Baseline --    Period --    Status --               PT Long Term Goals - 08/15/21 1222       PT LONG TERM GOAL #1   Title Pt will perform bed mobility with min A in order to demo decr caregiver burden. ALL LTGS DUE 10/10/21    Baseline needs mod A, did not have time to assess on 08/14/21 due to time constraints.    Time 8    Period Weeks    Status On-going    Target Date 10/10/21      PT LONG TERM GOAL #2   Title Pt will perform sliding board transfers with supervision/min guard in order to demo improved functional transfers.    Baseline needs total A for sliding board placement, min A to  initiate transfer and min guard for  remainder of transfer    Time 8    Period Weeks    Status Revised      PT LONG TERM GOAL #3   Title Pt will perform sit <> stand in [place] with max A in order to demo improved functional BLE strength for transfers.    Baseline needed total A+2    Time 8    Period Weeks    Status New      PT LONG TERM GOAL #4   Title Pt will be independent with mobility around obstacles and outdoor unlevel surfaces with power w/c in order to demo improved independence.    Baseline pt has not transferred into his power w/c yet for use.    Time 8    Period Weeks    Status On-going      PT LONG TERM GOAL #5   Title Pt will stand at [employer] vs. Charlaine Dalton for 3 minutes in order to demo improved standing tolerance for ADLs.    Baseline able to stand at [facility] for 1 minute and 6 seconds    Time 8    Period Weeks    Status Revised                   Plan - 09/06/21 0953     Clinical Impression Statement With sliding board transfers from w/c > mat table, pt needing initial min A to help initiate, but able to perform remainder with min guard. From mat table > w/c, pt able to perform transfer with min guard. Continued to utilize SARA plus for standing tolerance, stretching, weight shifting, and muscle activation in weight bearing. During 2nd stand attempt, pt able to weight shift to R and briefly unweight LLE. Unable to try weight shifting to LLE due to incr pain in knee and L shoulder. Will continue to progress towards LTGs.    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    Examination-Activity Limitations Bathing;Bed Mobility;Caring for Others;Locomotion Level;Squat;Dressing;Reach Overhead;Stand;Toileting;Transfers;Hygiene/Grooming;Lift     Examination-Participation Restrictions Cleaning;Community Activity;Laundry;Occupation;Driving;Meal Prep    Stability/Clinical Decision Making Unstable/Unpredictable    Rehab Potential Good    PT Frequency 1x / week    PT Duration 8 weeks   1-2x a week for 12 weeks   PT Treatment/Interventions ADLs/Self Care Home Management;Electrical Stimulation;DME Instruction;Gait training;Functional mobility training;Therapeutic activities;Neuromuscular re-education;Balance training;Therapeutic exercise;Patient/family education;Orthotic Fit/Training;Manual techniques;Passive range of motion;Energy conservation;Vestibular    PT Next Visit Plan Continue to use slideboard but focus on using LUE to push and lifting hips as he scoots.  May need to warm up with SCI Fit - LE and RUE from w/c.  Standing with SARA and Medium GREEN sling around back to assist with bringing COG over BOS.  Work on weight shifting, LE activation in standing and possibly taking steps (may have to remove knee block).    Consulted and Agree with Plan of Care Patient;Family member/caregiver    Family Member Consulted pt's mom             Patient will benefit from skilled therapeutic intervention in order to improve the following deficits and impairments:  Decreased activity tolerance, Decreased balance, Decreased coordination, Decreased endurance, Decreased mobility, Decreased range of motion, Difficulty walking, Decreased strength, Hypomobility, Increased muscle spasms, Impaired flexibility, Impaired tone,  Impaired sensation, Impaired UE functional use, Postural dysfunction  Visit Diagnosis: Other symptoms and signs involving the nervous system  Other symptoms and signs involving the musculoskeletal system  Muscle weakness (generalized)     Problem List Patient Active Problem List   Diagnosis Date Noted   Finger osteomyelitis, left ([place]) 09/23/2019   Leukocytosis 09/23/2019    Arliss Journey, PT, DPT  09/06/2021, 9:56  AM  [facility] [address] [place], [place], [address] Phone: [phone]   Fax:  [phone]  Name: Koleson Reifsteck MRN: [id number] Date of Birth: 27-Jul-1966

## 2021-09-07 DIAGNOSIS — G8252 Quadriplegia, C1-C4 incomplete: Secondary | ICD-10-CM | POA: Diagnosis not present

## 2021-09-08 DIAGNOSIS — G8252 Quadriplegia, C1-C4 incomplete: Secondary | ICD-10-CM | POA: Diagnosis not present

## 2021-09-09 DIAGNOSIS — G8252 Quadriplegia, C1-C4 incomplete: Secondary | ICD-10-CM | POA: Diagnosis not present

## 2021-09-10 DIAGNOSIS — Z1211 Encounter for screening for malignant neoplasm of colon: Secondary | ICD-10-CM | POA: Diagnosis not present

## 2021-09-10 DIAGNOSIS — Z7689 Persons encountering health services in other specified circumstances: Secondary | ICD-10-CM | POA: Diagnosis not present

## 2021-09-10 DIAGNOSIS — G8252 Quadriplegia, C1-C4 incomplete: Secondary | ICD-10-CM | POA: Diagnosis not present

## 2021-09-10 DIAGNOSIS — R198 Other specified symptoms and signs involving the digestive system and abdomen: Secondary | ICD-10-CM | POA: Diagnosis not present

## 2021-09-11 ENCOUNTER — Ambulatory Visit: Payer: Medicaid Other | Admitting: Physical Therapy

## 2021-09-11 DIAGNOSIS — G8252 Quadriplegia, C1-C4 incomplete: Secondary | ICD-10-CM | POA: Diagnosis not present

## 2021-09-12 DIAGNOSIS — G8252 Quadriplegia, C1-C4 incomplete: Secondary | ICD-10-CM | POA: Diagnosis not present

## 2021-09-13 DIAGNOSIS — G8252 Quadriplegia, C1-C4 incomplete: Secondary | ICD-10-CM | POA: Diagnosis not present

## 2021-09-14 DIAGNOSIS — G8252 Quadriplegia, C1-C4 incomplete: Secondary | ICD-10-CM | POA: Diagnosis not present

## 2021-09-15 DIAGNOSIS — G8252 Quadriplegia, C1-C4 incomplete: Secondary | ICD-10-CM | POA: Diagnosis not present

## 2021-09-16 DIAGNOSIS — G8252 Quadriplegia, C1-C4 incomplete: Secondary | ICD-10-CM | POA: Diagnosis not present

## 2021-09-17 DIAGNOSIS — G8252 Quadriplegia, C1-C4 incomplete: Secondary | ICD-10-CM | POA: Diagnosis not present

## 2021-09-17 DIAGNOSIS — C720 Malignant neoplasm of spinal cord: Secondary | ICD-10-CM | POA: Diagnosis not present

## 2021-09-18 ENCOUNTER — Ambulatory Visit: Payer: Medicaid Other | Admitting: Physical Therapy

## 2021-09-18 DIAGNOSIS — G8252 Quadriplegia, C1-C4 incomplete: Secondary | ICD-10-CM | POA: Diagnosis not present

## 2021-09-19 ENCOUNTER — Other Ambulatory Visit: Payer: Self-pay

## 2021-09-19 ENCOUNTER — Ambulatory Visit: Payer: Medicaid Other | Admitting: Physical Therapy

## 2021-09-19 ENCOUNTER — Ambulatory Visit: Payer: Medicaid Other | Admitting: Occupational Therapy

## 2021-09-19 DIAGNOSIS — R29898 Other symptoms and signs involving the musculoskeletal system: Secondary | ICD-10-CM

## 2021-09-19 DIAGNOSIS — M6281 Muscle weakness (generalized): Secondary | ICD-10-CM | POA: Diagnosis not present

## 2021-09-19 DIAGNOSIS — M24542 Contracture, left hand: Secondary | ICD-10-CM | POA: Diagnosis not present

## 2021-09-19 DIAGNOSIS — R29818 Other symptoms and signs involving the nervous system: Secondary | ICD-10-CM | POA: Diagnosis not present

## 2021-09-19 DIAGNOSIS — R293 Abnormal posture: Secondary | ICD-10-CM | POA: Diagnosis not present

## 2021-09-19 DIAGNOSIS — R278 Other lack of coordination: Secondary | ICD-10-CM | POA: Diagnosis not present

## 2021-09-19 DIAGNOSIS — G8252 Quadriplegia, C1-C4 incomplete: Secondary | ICD-10-CM | POA: Diagnosis not present

## 2021-09-19 NOTE — Therapy (Signed)
[facility] [address] [place], [place], [address] Phone: [phone]   Fax:  [phone]  Occupational Therapy Treatment  Patient Details  Name: Carlos [name] MRN: [id number] Date of Birth: 08/12/1966 No data recorded  Encounter Date: 09/19/2021   OT End of Session - 09/19/21 1436     Visit Number 6    Number of Visits 11    Date for OT Re-Evaluation 10/06/21   up to 10 weeks out (10/06/21) if 1x/wk   Authorization Type [facility] - awaiting authorization, asking for 10 (P.T. to use 17)    Authorization Time Period 10 visits (07/26/21 - 09/11/21) - resubmitted/email [facility] for more visits 09/05/21 and got scheduled for more visits.    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   Subjective Assessment - 09/19/21 1324     Subjective  Pt denies any pain.    Patient is accompanied by: Family member   son   Pertinent History spinal cord ependymoma diagnosed 09/2020, C3-C7 laminectomies on 11/23/20 to remove intradural intramedullary mass. Crush injuries bilateral hands 2 years ago w/ multiple fx's, Lt long finger partial amputation. Lt hand now min opening since neck surgery     Currently in Pain? No/denies             Reviewed caregiver stretches for LUE including: sh flex, sh abd, elbow ext, forearm pronation, wrist flex, and finger ext  Pt flipping large cards over Rt hand for function.  Simulated pulling up to stand w/ RUE tug of war ex   Discussed with pt/mother LUE and why to focus just on stretches as pt has no functional use and limited by spasticity. Discussed requirements needed for Steady lift. Pt/mother also had questions about aquatic therapy however have concerns about logistics of getting pt in/out of pool and drying off and changing. Pt requires assist x 2 just to transfer.                        OT Short Term Goals - 09/19/21 1437       OT SHORT TERM GOAL #1   Title Caregiver independent w/ LUE stretching HEP    Baseline Not yet issued    Time 2    Period Weeks    Status Achieved      OT SHORT TERM GOAL #2   Title Pt independent with RUE HEP for shoulder ROM, functional use, and hand coordination    Baseline Not yet issued    Time 2    Period Weeks  Status On-going      OT SHORT TERM GOAL #3   Title Caregiver independent with splint wear and care Lt hand    Baseline not yet issued    Time 2    Period Weeks    Status Deferred   Due to spasticity, could only tolerate foam roll w/ wash cloth around it for palm              OT Long Term Goals - 08/28/21 1545       OT LONG TERM GOAL #1   Title Pt to improve Rt shoulder motion to brush 75% or more of hair, and assist w/ donning shirt w/ mod assist    Baseline dependent, sh flex to 65*, ER approx 60%    Time 5    Period Weeks    Status New      OT LONG TERM GOAL #2   Title Pt will score 15 blocks or greater with RUE for increasing functional use of RUE.    Baseline R 10 blocks    Time 5    Period Weeks    Status Revised      OT LONG TERM GOAL #3   Title Pt to perform toilet transfer using sliding board and drop arm BSC or Stedy lift w/ max  assist x 1    Baseline unable - currently using bed pan in bed    Time 5    Period Weeks    Status New      OT LONG TERM GOAL #4   Title Pt/family to verbalize understanding with task modifications and AE/DME needs to increase participation and independence w/ ADLS    Baseline Dependent    Time 5    Period Weeks    Status New                   Plan - 09/19/21 1438     Clinical Impression Statement ? carryover of recommendations/stretches at home. Pt also limited by visits d/t MCD    OT Occupational Profile and History Detailed Assessment- Review of Records and additional review of physical, cognitive, psychosocial history related to current functional performance    Occupational performance deficits (Please refer to evaluation for details): ADL's;IADL's;Leisure;Social Participation    Body Structure / Function / Physical Skills ADL;Decreased knowledge of use of DME;Strength;Pain;Dexterity;UE functional use;Body mechanics;Proprioception;IADL;Endurance;ROM;Improper spinal/pelvic alignment;Mobility;Coordination;Flexibility;Sensation;FMC    Rehab Potential Fair    Clinical Decision Making Multiple treatment options, significant modification of task necessary    Comorbidities Affecting Occupational Performance: May have comorbidities impacting occupational performance    Modification or Assistance to Complete Evaluation  Min-Moderate modification of tasks or assist with assess necessary to complete eval    OT Frequency 2x / week    OT Duration --   for 5 weeks OR 10 visits over 10 weeks (if pt reduces down to 1x/wk)   OT Treatment/Interventions Self-care/ADL training;Moist Heat;Fluidtherapy;DME and/or AE instruction;Splinting;Therapeutic activities;Therapeutic exercise;Coping strategies training;Neuromuscular education;Functional Mobility Training;Passive range of motion;Patient/family education;[place]    Plan Attempt use of Steady w/ assist and/or [place] Laundry and Agree with  Plan of Care Patient;Family member/caregiver    Family Member Consulted mother and daughter             Patient will benefit from skilled therapeutic intervention in order to improve the following deficits and impairments:   Body Structure / Function / Physical Skills: ADL, Decreased knowledge of use of DME, Strength, Pain, Dexterity, UE functional use,  Body mechanics, Proprioception, IADL, Endurance, ROM, Improper spinal/pelvic alignment, Mobility, Coordination, Flexibility, Sensation, [facility]       Visit Diagnosis: Other symptoms and signs involving the nervous system  Other symptoms and signs involving the musculoskeletal system    Problem List Patient Active Problem List   Diagnosis Date Noted   Finger osteomyelitis, left ([place]) 09/23/2019   Leukocytosis 09/23/2019    Carey Bullocks, OTR/L 09/19/2021, 2:42 PM  [facility] [address] [place], [place], [address] Phone: [phone]   Fax:  [phone]  Name: Carlos [name] MRN: [id number] Date of Birth: Dec 22, 1965

## 2021-09-20 DIAGNOSIS — G8252 Quadriplegia, C1-C4 incomplete: Secondary | ICD-10-CM | POA: Diagnosis not present

## 2021-09-21 ENCOUNTER — Ambulatory Visit: Payer: Medicaid Other | Admitting: Occupational Therapy

## 2021-09-21 DIAGNOSIS — G8252 Quadriplegia, C1-C4 incomplete: Secondary | ICD-10-CM | POA: Diagnosis not present

## 2021-09-22 DIAGNOSIS — G8252 Quadriplegia, C1-C4 incomplete: Secondary | ICD-10-CM | POA: Diagnosis not present

## 2021-09-23 DIAGNOSIS — G8252 Quadriplegia, C1-C4 incomplete: Secondary | ICD-10-CM | POA: Diagnosis not present

## 2021-09-24 DIAGNOSIS — G8252 Quadriplegia, C1-C4 incomplete: Secondary | ICD-10-CM | POA: Diagnosis not present

## 2021-09-25 ENCOUNTER — Encounter: Payer: Self-pay | Admitting: Physical Therapy

## 2021-09-25 ENCOUNTER — Other Ambulatory Visit: Payer: Self-pay

## 2021-09-25 ENCOUNTER — Ambulatory Visit: Payer: Medicaid Other | Admitting: Physical Therapy

## 2021-09-25 ENCOUNTER — Ambulatory Visit: Payer: Medicaid Other | Admitting: Occupational Therapy

## 2021-09-25 DIAGNOSIS — R293 Abnormal posture: Secondary | ICD-10-CM | POA: Diagnosis not present

## 2021-09-25 DIAGNOSIS — R29818 Other symptoms and signs involving the nervous system: Secondary | ICD-10-CM

## 2021-09-25 DIAGNOSIS — G8252 Quadriplegia, C1-C4 incomplete: Secondary | ICD-10-CM | POA: Diagnosis not present

## 2021-09-25 DIAGNOSIS — M24542 Contracture, left hand: Secondary | ICD-10-CM | POA: Diagnosis not present

## 2021-09-25 DIAGNOSIS — M6281 Muscle weakness (generalized): Secondary | ICD-10-CM | POA: Diagnosis not present

## 2021-09-25 DIAGNOSIS — R29898 Other symptoms and signs involving the musculoskeletal system: Secondary | ICD-10-CM

## 2021-09-25 DIAGNOSIS — R278 Other lack of coordination: Secondary | ICD-10-CM | POA: Diagnosis not present

## 2021-09-25 NOTE — Therapy (Signed)
[facility] [address] [place], [place], [address] Phone: [phone]   Fax:  [phone]  Physical Therapy Treatment  Patient Details  Name: Carlos [name] MRN: [id number] Date of Birth: 09-Jun-1966 Referring Provider (PT): Tommas Olp, MD   Encounter Date: 09/25/2021   PT End of Session - 09/25/21 1442     Visit Number 14    Number of Visits 20    Date for PT Re-Evaluation 10/09/21    Authorization Type [employer]    Authorization Time Period 8 visits from 08/09/21 - 10/09/2021    Authorization - Visit Number 3    Authorization - Number of Visits 8    PT Start Time 1440    PT Stop Time 1528    PT Time Calculation (min) 48 min    Equipment Utilized During Treatment Other (comment)   Slideboard, Clarise Cruz plus   Activity Tolerance Patient limited by pain;Patient tolerated treatment well    Behavior During Therapy [facility] for tasks assessed/performed             Past Medical History:  Diagnosis Date   Crush injury to hand 12/2018   bilateral hands with multipe fractures   Finger osteomyelitis, left ([place] [place]) 12/2018    Past Surgical History:  Procedure Laterality Date   I & D EXTREMITY Left 09/23/2019   Procedure: IRRIGATION AND DEBRIDEMENT OF  LEFT LONG FINGER, REVISION OF LEFT LONG FINGER AMPUTATION;  Surgeon: Verner Mould, MD;  Location: [facility];  Service: Orthopedics;  Laterality: Left;   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   Subjective Assessment - 09/25/21 1440     Subjective Nothing new since he was last here.    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    Currently in Pain? No/denies                               [facility] Adult PT Treatment/Exercise - 09/25/21 1554       Bed Mobility   Supine to Sit Moderate Assistance - Patient 50-74%    Supine to Sit Details (indicate cue type and reason) Tried to attempt to get pt to roll towards L and use RUE to help push from arm rest of chair as simulated bed rail, attempted a couple times but pt kept saying he can't do it this way. Pt instead brough his legs off the mat and PT needing to provide mod A at pt's trunk with cues to help push through R hand from mat table (pt's RUE retracted when he tried to do this)      Transfers   Transfers Lateral/Scoot Transfers;Stand to Sit;Sit to Stand    Sit to Stand 2: Max assist    Sit to Stand Details (indicate cue type and reason) Use of Clarise Cruz plus with no foot plate and green sling. Performed from elevated mat table. Cues to lean forward for anterior weight shift and to help push through legs to stand. On first stand worked on standing tolerance, tall posture and hip extensor  activation. Able to stand for approx 1 minute. Removed shin plate during 2nd stand, PT and PTA blocking pt's knees working on active knee extension x8 reps. Pt then needing to sit down due to fatigue. PT tech helping to steady Clarise Cruz during sit <> stands and standing.    Stand to Sit 2: Max assist    Stand to Sit Details From Sara plus to mat table    Lateral/Scoot Transfers 4: Min guard;4: Min assist    Lateral/Scoot Transfer Details (indicate cue type and reason) From mat table > w/c at end of session (pt still on mat from previous OT session), going towards pt's R. Needs initial assist to get onto board, otherwise remainder with min guard and assist with leg positioning. Cues throughout for head/hips relationship.      Knee/Hip Exercises: Supine   Quad Sets Strengthening;AROM;Both;1 set;10 reps    Quad Sets Limitations Verbal and tactile cues  for technique and therapist helping pt maintain leg in neutral position.    [employer] Strengthening;AROM;Both;1 set;10 Industrial/product designer and tactile cues for technique and isometric hold.    Heel Slides AAROM;AROM;Strengthening;Both;1 set;10 reps    Heel Slides Limitations With pillow case on pt's foot to help decrease friction. AAROM needed for LLE for knee flexion and therapist assisting to help keep legs in proper position.    Bridges Strengthening;AROM;1 set;10 reps    [employer] for full ROM and hip ADD activation    Other Supine Knee/Hip Exercises 2 x 10 reps hooklying yoga block squeezes for hip ADD with therapist holding block, x3 second holds, cues for incr activation with LLE.                       PT Short Term Goals - 09/25/21 2115       PT SHORT TERM GOAL #1   Title Pt will be able to stand at [facility] for 2 minutes with min guard in order to demo improved standing tolerance. ALL STGS DUE 09/12/21    Baseline able to perform at session on 09/05/21, stood for a max of 1:30 on 09/25/21    Time 4    Period Weeks    Status Partially Met    Target Date 09/12/21      PT SHORT TERM GOAL #2   Title Pt will be able to perform sliding board transfer from w/c <> mat table with min guard in order to demo improved functional transfers to decr caregiver burden and transfer into his power w/c.    Baseline min A to initiate transfer and min guard for remainder of transfer    Time 4    Period Weeks    Status Partially Met      PT SHORT TERM GOAL #3   Title Pt and pt's family will be independent with exercise program for stretching/ROM in order to build upon functional gains made in therapy.    Baseline --    Time 4    Period Weeks    Status New               PT Long Term Goals - 08/15/21 1222       PT LONG TERM GOAL #1   Title Pt will perform bed mobility with min A in order to demo decr caregiver burden. ALL  LTGS DUE 10/10/21    Baseline needs mod A, did not have time  to assess on 08/14/21 due to time constraints.    Time 8    Period Weeks    Status On-going    Target Date 10/10/21      PT LONG TERM GOAL #2   Title Pt will perform sliding board transfers with supervision/min guard in order to demo improved functional transfers.    Baseline needs total A for sliding board placement, min A to initiate transfer and min guard for remainder of transfer    Time 8    Period Weeks    Status Revised      PT LONG TERM GOAL #3   Title Pt will perform sit <> stand in [place] with max A in order to demo improved functional BLE strength for transfers.    Baseline needed total A+2    Time 8    Period Weeks    Status New      PT LONG TERM GOAL #4   Title Pt will be independent with mobility around obstacles and outdoor unlevel surfaces with power w/c in order to demo improved independence.    Baseline pt has not transferred into his power w/c yet for use.    Time 8    Period Weeks    Status On-going      PT LONG TERM GOAL #5   Title Pt will stand at [employer] vs. Charlaine Dalton for 3 minutes in order to demo improved standing tolerance for ADLs.    Baseline able to stand at [facility] for 1 minute and 6 seconds    Time 8    Period Weeks    Status Revised                   Plan - 09/25/21 2118     Clinical Impression Statement Pt more fatigued during today's session due to pt having OT earlier and did a lot of standing with the SaraPlus. Only able to stand today for a max of 1:30 seconds. Tried standing today without the shin plate/knee block with Clarise Cruz Plus with PT tech providing additional assist to block other knee with pt able to perform active bilat knee extension. Pt needing min A at end of session to initiate sliding board transfer but performs the remainder with min guard, with continued cues for head hips relationship. Will continue to progress towards LTGs.    Personal Factors and  Comorbidities Comorbidity 3+;Time since onset of injury/illness/exacerbation;Past/Current Experience;Behavior Pattern    Comorbidities excision of intradural intramedullary mass on 11/23/2020. 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    Examination-Activity Limitations Bathing;Bed Mobility;Caring for Others;Locomotion Level;Squat;Dressing;Reach Overhead;Stand;Toileting;Transfers;Hygiene/Grooming;Lift    Examination-Participation Restrictions Cleaning;Community Activity;Laundry;Occupation;Driving;Meal Prep    Stability/Clinical Decision Making Unstable/Unpredictable    Rehab Potential Good    PT Frequency 1x / week    PT Duration 8 weeks   1-2x a week for 12 weeks   PT Treatment/Interventions ADLs/Self Care Home Management;Electrical Stimulation;DME Instruction;Gait training;Functional mobility training;Therapeutic activities;Neuromuscular re-education;Balance training;Therapeutic exercise;Patient/family education;Orthotic Fit/Training;Manual techniques;Passive range of motion;Energy conservation;Vestibular    PT Next Visit Plan Continue to use slideboard but focus on using LUE to push and lifting hips as he scoots.  May need to warm up with SCI Fit - LE and RUE from w/c.  Standing with SARA and Medium GREEN sling around back to assist with bringing COG over BOS.  Work on weight shifting, LE activation in standing and possibly taking steps    Consulted and Agree with Plan  of Care Patient;Family member/caregiver    Family Member Consulted pt's mom             Patient will benefit from skilled therapeutic intervention in order to improve the following deficits and impairments:  Decreased activity tolerance, Decreased balance, Decreased coordination, Decreased endurance, Decreased mobility, Decreased range of motion, Difficulty walking, Decreased strength, Hypomobility, Increased muscle spasms, Impaired flexibility, Impaired tone, Impaired sensation,  Impaired UE functional use, Postural dysfunction  Visit Diagnosis: Other symptoms and signs involving the nervous system  Muscle weakness (generalized)     Problem List Patient Active Problem List   Diagnosis Date Noted   Finger osteomyelitis, left ([place]) 09/23/2019   Leukocytosis 09/23/2019    Arliss Journey, PT, DPT  09/25/2021, 9:20 PM  [facility] [address] [place], [place], [address] Phone: [phone]   Fax:  [phone]  Name: Carlos [name] MRN: [id number] Date of Birth: [birth year]

## 2021-09-25 NOTE — Therapy (Signed)
[facility] [address] [place], [place], [address] Phone: [phone]   Fax:  [phone]  Occupational Therapy Treatment  Patient Details  Name: Carlos [name] MRN: [id number] Date of Birth: 1966/10/26 No data recorded  Encounter Date: 09/25/2021   OT End of Session - 09/25/21 1417     Visit Number 7    Number of Visits 11    Date for OT Re-Evaluation 10/06/21   up to 10 weeks out (10/06/21) if 1x/wk   Authorization Type [employer] [facility] - awaiting authorization, asking for 10 (P.T. to use 17)    Authorization Time Period 10 visits (07/26/21 - 09/11/21) - approved 5 more 09/19/21 - 11/18/21    Authorization - Visit Number 2    Authorization - Number of Visits 5    OT Start Time 1320    OT Stop Time 1400    OT Time Calculation (min) 40 min    Activity Tolerance Patient limited by fatigue    Behavior During Therapy [facility] for tasks assessed/performed             Past Medical History:  Diagnosis Date   Crush injury to hand 12/2018   bilateral hands with multipe fractures   Finger osteomyelitis, left ([facility]) 12/2018    Past Surgical History:  Procedure Laterality Date   I & D EXTREMITY Left 09/23/2019   Procedure: IRRIGATION AND DEBRIDEMENT OF  LEFT LONG FINGER, REVISION OF LEFT LONG FINGER AMPUTATION;  Surgeon: Verner Mould, MD;  Location: [facility];  Service: Orthopedics;  Laterality: Left;   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   Subjective Assessment - 09/25/21 1417     Subjective  Pt only c/o pain when attempting to use Steady    Patient is accompanied by: Family member   son   Pertinent History spinal cord ependymoma diagnosed 09/2020, C3-C7 laminectomies on 11/23/20 to remove intradural intramedullary mass. Crush injuries bilateral hands 2 years ago w/ multiple fx's, Lt long finger partial amputation. Lt hand now min opening since neck surgery     Currently in Pain? No/denies             Pt transferred from w/c to mat using transfer board and mod assist x 1.   Sit to stand from elevated mat using Sarah: standing x 3.5 min first time, x 5 min second time w/ rest breaks. Pt cued occasionally to stand upright and to shift weight more to Lt side  Attempted sit to stand using Steady x 3  in prep for transfers and toileting, with 2 skilled therapists, but unable to do and c/o pain Lt elbow and knee.   Sit to supine with max assist and second person assist for LE management.                        OT Short Term Goals - 09/19/21 1437       OT SHORT TERM GOAL #1   Title Caregiver independent w/ LUE stretching HEP    Baseline Not yet issued    Time 2    Period Weeks    Status Achieved      OT SHORT TERM GOAL #2   Title Pt independent with RUE HEP for shoulder ROM, functional use, and hand coordination    Baseline Not yet issued    Time 2    Period Weeks  Status On-going      OT SHORT TERM GOAL #3   Title Caregiver independent with splint wear and care Lt hand    Baseline not yet issued    Time 2    Period Weeks    Status Deferred   Due to spasticity, could only tolerate foam roll w/ wash cloth around it for palm              OT Long Term Goals - 08/28/21 1545       OT LONG TERM GOAL #1   Title Pt to improve Rt shoulder motion to brush 75% or more of hair, and assist w/ donning shirt w/ mod assist    Baseline dependent, sh flex to 65*, ER approx 60%    Time 5    Period Weeks    Status New      OT LONG TERM GOAL #2   Title Pt will score 15 blocks or greater with RUE for increasing functional use of RUE.    Baseline R 10 blocks    Time 5    Period Weeks    Status Revised      OT LONG TERM GOAL #3   Title Pt to perform toilet transfer using sliding board and drop arm BSC or Stedy lift w/ max assist x 1    Baseline unable - currently using bed pan in bed    Time 5    Period Weeks     Status New      OT LONG TERM GOAL #4   Title Pt/family to verbalize understanding with task modifications and AE/DME needs to increase participation and independence w/ ADLS    Baseline Dependent    Time 5    Period Weeks    Status New                   Plan - 09/25/21 1420     Clinical Impression Statement Pt with decreased tolerance to Steady and c/o pain only with this today    OT Occupational Profile and History Detailed Assessment- Review of Records and additional review of physical, cognitive, psychosocial history related to current functional performance    Occupational performance deficits (Please refer to evaluation for details): ADL's;IADL's;Leisure;Social Participation    Body Structure / Function / Physical Skills ADL;Decreased knowledge of use of DME;Strength;Pain;Dexterity;UE functional use;Body mechanics;Proprioception;IADL;Endurance;ROM;Improper spinal/pelvic alignment;Mobility;Coordination;Flexibility;Sensation;FMC    Rehab Potential Fair    Clinical Decision Making Multiple treatment options, significant modification of task necessary    Comorbidities Affecting Occupational Performance: May have comorbidities impacting occupational performance    Modification or Assistance to Complete Evaluation  Min-Moderate modification of tasks or assist with assess necessary to complete eval    OT Frequency 2x / week    OT Duration --   for 5 weeks OR 10 visits over 10 weeks (if pt reduces down to 1x/wk)   OT Treatment/Interventions Self-care/ADL training;Moist Heat;Fluidtherapy;DME and/or AE instruction;Splinting;Therapeutic activities;Therapeutic exercise;Coping strategies training;Neuromuscular education;Functional Mobility Training;Passive range of motion;Patient/family education;Paraffin    Plan Supine: stretches for BUE's, functional use of RUE, work on transfers using sliding board    Consulted and Agree with Plan of Care Patient;Family member/caregiver    Family Member  Consulted mother and daughter             Patient will benefit from skilled therapeutic intervention in order to improve the following deficits and impairments:   Body Structure / Function / Physical Skills: ADL, Decreased knowledge of use of DME,  Strength, Pain, Dexterity, UE functional use, Body mechanics, Proprioception, IADL, Endurance, ROM, Improper spinal/pelvic alignment, Mobility, Coordination, Flexibility, Sensation, [facility]       Visit Diagnosis: Other symptoms and signs involving the nervous system  Other symptoms and signs involving the musculoskeletal system  Abnormal posture    Problem List Patient Active Problem List   Diagnosis Date Noted   Finger osteomyelitis, left ([facility]) 09/23/2019   Leukocytosis 09/23/2019    Carey Bullocks, OTR/L 09/25/2021, 2:22 PM  [facility] [address] [place], [place], [address] Phone: [phone]   Fax:  [phone]  Name: Carlos [name] MRN: [id number] Date of Birth: 24-May-1966

## 2021-09-26 DIAGNOSIS — G8252 Quadriplegia, C1-C4 incomplete: Secondary | ICD-10-CM | POA: Diagnosis not present

## 2021-09-27 DIAGNOSIS — G8252 Quadriplegia, C1-C4 incomplete: Secondary | ICD-10-CM | POA: Diagnosis not present

## 2021-09-28 DIAGNOSIS — G8252 Quadriplegia, C1-C4 incomplete: Secondary | ICD-10-CM | POA: Diagnosis not present

## 2021-09-30 DIAGNOSIS — G8252 Quadriplegia, C1-C4 incomplete: Secondary | ICD-10-CM | POA: Diagnosis not present

## 2021-10-02 ENCOUNTER — Other Ambulatory Visit: Payer: Self-pay

## 2021-10-02 ENCOUNTER — Ambulatory Visit: Payer: Medicaid Other | Admitting: Physical Therapy

## 2021-10-02 ENCOUNTER — Ambulatory Visit: Payer: Medicaid Other | Admitting: Occupational Therapy

## 2021-10-02 ENCOUNTER — Encounter: Payer: Self-pay | Admitting: Occupational Therapy

## 2021-10-02 DIAGNOSIS — Z743 Need for continuous supervision: Secondary | ICD-10-CM | POA: Diagnosis not present

## 2021-10-02 DIAGNOSIS — R6889 Other general symptoms and signs: Secondary | ICD-10-CM | POA: Diagnosis not present

## 2021-10-02 DIAGNOSIS — R29818 Other symptoms and signs involving the nervous system: Secondary | ICD-10-CM

## 2021-10-02 DIAGNOSIS — R55 Syncope and collapse: Secondary | ICD-10-CM | POA: Diagnosis not present

## 2021-10-02 NOTE — Therapy (Signed)
[facility] [address] [place], [place], [address] Phone: [phone]   Fax:  [phone]  Occupational Therapy Treatment  Arrive/No Charge  Patient Details  Name: Carlos [name] MRN: [id number] Date of Birth: 14-May-1966 No data recorded  Encounter Date: 10/02/2021   OT End of Session - 10/02/21 1416     Visit Number 0   arrive no charge   Number of Visits 11    Date for OT Re-Evaluation 10/06/21   up to 10 weeks out (10/06/21) if 1x/wk   Authorization Type MCD [facility] - awaiting authorization, asking for 10 (P.T. to use 17)    Authorization Time Period 10 visits (07/26/21 - 09/11/21) - approved 5 more 09/19/21 - 11/18/21    Authorization - Visit Number 2    Authorization - Number of Visits 5    Activity Tolerance Patient limited by fatigue    Behavior During Therapy [facility] for tasks assessed/performed             Past Medical History:  Diagnosis Date   Crush injury to hand 12/2018   bilateral hands with multipe fractures   Finger osteomyelitis, left ([facility]) 12/2018    Past Surgical History:  Procedure Laterality Date   I & D EXTREMITY Left 09/23/2019   Procedure: IRRIGATION AND DEBRIDEMENT OF  LEFT LONG FINGER, REVISION OF LEFT LONG FINGER AMPUTATION;  Surgeon: Verner Mould, MD;  Location: [facility];  Service: Orthopedics;  Laterality: Left;   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    Pt was picked up from lobby and assisted to therapy gym for OT session. Pt reported feeling dizzy and immediately turned clammy and pale in the face with perspiration. Pt went unresponsive and EMS was called. Pt was tilted back and feet were elevated in wheelchair. Pt began getting color back in face and was more responsive and alert. BP 106/51 and HR 51 bpm. EMS arrived and assessed patient. Pt with decreased blood pressure upon EMS assessment. Pt assisted with water and  crackers. Pt handed over to mother.                       OT Short Term Goals - 09/19/21 1437       OT SHORT TERM GOAL #1   Title Caregiver independent w/ LUE stretching HEP    Baseline Not yet issued    Time 2    Period Weeks    Status Achieved      OT SHORT TERM GOAL #2   Title Pt independent with RUE HEP for shoulder ROM, functional use, and hand coordination    Baseline Not yet issued    Time 2    Period Weeks    Status On-going      OT SHORT TERM GOAL #3   Title Caregiver independent with splint wear and care Lt hand    Baseline not yet issued    Time 2    Period Weeks    Status Deferred   Due to spasticity, could only tolerate foam roll w/ wash cloth around it for palm              OT Long Term Goals - 08/28/21 1545       OT LONG TERM GOAL #1   Title Pt to improve Rt shoulder motion to brush 75% or more of hair, and assist w/ donning shirt w/  mod assist    Baseline dependent, sh flex to 65*, ER approx 60%    Time 5    Period Weeks    Status New      OT LONG TERM GOAL #2   Title Pt will score 15 blocks or greater with RUE for increasing functional use of RUE.    Baseline R 10 blocks    Time 5    Period Weeks    Status Revised      OT LONG TERM GOAL #3   Title Pt to perform toilet transfer using sliding board and drop arm BSC or Stedy lift w/ max assist x 1    Baseline unable - currently using bed pan in bed    Time 5    Period Weeks    Status New      OT LONG TERM GOAL #4   Title Pt/family to verbalize understanding with task modifications and AE/DME needs to increase participation and independence w/ ADLS    Baseline Dependent    Time 5    Period Weeks    Status New                    Patient will benefit from skilled therapeutic intervention in order to improve the following deficits and impairments:           Visit Diagnosis: Other symptoms and signs involving the nervous system    Problem  List Patient Active Problem List   Diagnosis Date Noted   Finger osteomyelitis, left ([place]) 09/23/2019   Leukocytosis 09/23/2019    Zachery Conch, OT/L 10/02/2021, 2:21 PM  [facility] [address] [place], [place], [address] Phone: [phone]   Fax:  [phone]  Name: Carlos [name] MRN: [id number] Date of Birth: 16-May-1966

## 2021-10-04 DIAGNOSIS — Z419 Encounter for procedure for purposes other than remedying health state, unspecified: Secondary | ICD-10-CM | POA: Diagnosis not present

## 2021-10-09 ENCOUNTER — Ambulatory Visit: Payer: Medicaid Other | Admitting: Occupational Therapy

## 2021-10-09 ENCOUNTER — Ambulatory Visit: Payer: Medicaid Other | Admitting: Physical Therapy

## 2021-10-09 DIAGNOSIS — G8252 Quadriplegia, C1-C4 incomplete: Secondary | ICD-10-CM | POA: Diagnosis not present

## 2021-10-10 DIAGNOSIS — G8252 Quadriplegia, C1-C4 incomplete: Secondary | ICD-10-CM | POA: Diagnosis not present

## 2021-10-11 DIAGNOSIS — G8252 Quadriplegia, C1-C4 incomplete: Secondary | ICD-10-CM | POA: Diagnosis not present

## 2021-10-12 DIAGNOSIS — G8252 Quadriplegia, C1-C4 incomplete: Secondary | ICD-10-CM | POA: Diagnosis not present

## 2021-10-15 DIAGNOSIS — G8252 Quadriplegia, C1-C4 incomplete: Secondary | ICD-10-CM | POA: Diagnosis not present

## 2021-10-16 DIAGNOSIS — G8252 Quadriplegia, C1-C4 incomplete: Secondary | ICD-10-CM | POA: Diagnosis not present

## 2021-10-17 DIAGNOSIS — G8252 Quadriplegia, C1-C4 incomplete: Secondary | ICD-10-CM | POA: Diagnosis not present

## 2021-10-18 DIAGNOSIS — G8252 Quadriplegia, C1-C4 incomplete: Secondary | ICD-10-CM | POA: Diagnosis not present

## 2021-10-19 ENCOUNTER — Ambulatory Visit: Payer: Medicaid Other | Attending: Neurology | Admitting: Physical Therapy

## 2021-10-19 ENCOUNTER — Encounter: Payer: Self-pay | Admitting: Occupational Therapy

## 2021-10-19 ENCOUNTER — Other Ambulatory Visit: Payer: Self-pay

## 2021-10-19 ENCOUNTER — Ambulatory Visit: Payer: Medicaid Other | Admitting: Occupational Therapy

## 2021-10-19 VITALS — BP 122/77 | HR 99

## 2021-10-19 DIAGNOSIS — M24542 Contracture, left hand: Secondary | ICD-10-CM

## 2021-10-19 DIAGNOSIS — R278 Other lack of coordination: Secondary | ICD-10-CM

## 2021-10-19 DIAGNOSIS — R293 Abnormal posture: Secondary | ICD-10-CM

## 2021-10-19 DIAGNOSIS — G8252 Quadriplegia, C1-C4 incomplete: Secondary | ICD-10-CM | POA: Diagnosis not present

## 2021-10-19 DIAGNOSIS — M6281 Muscle weakness (generalized): Secondary | ICD-10-CM | POA: Diagnosis not present

## 2021-10-19 DIAGNOSIS — R29818 Other symptoms and signs involving the nervous system: Secondary | ICD-10-CM

## 2021-10-19 DIAGNOSIS — R262 Difficulty in walking, not elsewhere classified: Secondary | ICD-10-CM | POA: Insufficient documentation

## 2021-10-19 DIAGNOSIS — R29898 Other symptoms and signs involving the musculoskeletal system: Secondary | ICD-10-CM | POA: Diagnosis not present

## 2021-10-19 NOTE — Therapy (Signed)
[facility] [address] [place], [place], [address] Phone: [phone]   Fax:  [phone]  Occupational Therapy Treatment  Patient Details  Name: Carlos [name] MRN: [id number] Date of Birth: 28-Oct-1966 No data recorded  Encounter Date: 10/19/2021   OT End of Session - 10/19/21 1325     Visit Number 8    Number of Visits 11    Date for OT Re-Evaluation 10/06/21   up to 10 weeks out (10/06/21) if 1x/wk   Authorization Type [facility] - awaiting authorization, asking for 10 (P.T. to use 17)    Authorization Time Period 10 visits (07/26/21 - 09/11/21) - approved 5 more 09/19/21 - 11/18/21    Authorization - Visit Number 3    Authorization - Number of Visits 5    OT Start Time 1318    OT Stop Time 1400    OT Time Calculation (min) 42 min    Activity Tolerance Patient limited by fatigue    Behavior During Therapy [facility] for tasks assessed/performed             Past Medical History:  Diagnosis Date   Crush injury to hand 12/2018   bilateral hands with multipe fractures   Finger osteomyelitis, left ([place]) 12/2018    Past Surgical History:  Procedure Laterality Date   I & D EXTREMITY Left 09/23/2019   Procedure: IRRIGATION AND DEBRIDEMENT OF  LEFT LONG FINGER, REVISION OF LEFT LONG FINGER AMPUTATION;  Surgeon: Verner Mould, MD;  Location: [facility];  Service: Orthopedics;  Laterality: Left;   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    Patient is accompanied by: Family member   son   Pertinent History spinal cord ependymoma diagnosed 09/2020, C3-C7 laminectomies on 11/23/20 to remove intradural intramedullary mass. Crush injuries  bilateral hands 2 years ago w/ multiple fx's, Lt long finger partial amputation. Lt hand now min opening since neck surgery    Currently in Pain? No/denies    Pain Score 0-No pain                          OT Treatments/Exercises (OP) - 10/19/21 1433       Transfers   Comments Sliding Board transfer today with mod AA at first but patient was able to use RUE to push self across board at end with min A      ADLs   UB Dressing doffed coat with max A - pt encouraged to reach across body to pull jacket off L side with RUE - pt limited with range of motion but can reach to shoulder with encouragement. Pt donned and doffed pull over shirt with max A but with increasing participation and independence with patient attempted to thread BUE and pulling shirt overhead. Pt req'd assistance with all aspects but demonstrated increased independence with task.      Neurological Re-education Exercises   Other Weight-Bearing Exercises 1 weight bearing onto LUE forearm and pushing up with increasing reach with RUE while on forearm.  OT Short Term Goals - 09/19/21 1437       OT SHORT TERM GOAL #1   Title Caregiver independent w/ LUE stretching HEP    Baseline Not yet issued    Time 2    Period Weeks    Status Achieved      OT SHORT TERM GOAL #2   Title Pt independent with RUE HEP for shoulder ROM, functional use, and hand coordination    Baseline Not yet issued    Time 2    Period Weeks    Status On-going      OT SHORT TERM GOAL #3   Title Caregiver independent with splint wear and care Lt hand    Baseline not yet issued    Time 2    Period Weeks    Status Deferred   Due to spasticity, could only tolerate foam roll w/ wash cloth around it for palm              OT Long Term Goals - 08/28/21 1545       OT LONG TERM GOAL #1   Title Pt to improve Rt shoulder motion to brush 75% or more of hair, and assist w/ donning shirt w/ mod assist     Baseline dependent, sh flex to 65*, ER approx 60%    Time 5    Period Weeks    Status New      OT LONG TERM GOAL #2   Title Pt will score 15 blocks or greater with RUE for increasing functional use of RUE.    Baseline R 10 blocks    Time 5    Period Weeks    Status Revised      OT LONG TERM GOAL #3   Title Pt to perform toilet transfer using sliding board and drop arm BSC or Stedy lift w/ max assist x 1    Baseline unable - currently using bed pan in bed    Time 5    Period Weeks    Status New      OT LONG TERM GOAL #4   Title Pt/family to verbalize understanding with task modifications and AE/DME needs to increase participation and independence w/ ADLS    Baseline Dependent    Time 5    Period Weeks    Status New                   Plan - 10/19/21 1441     Clinical Impression Statement Pt req'd encouragement for pushing past limits but was able to demonstrate increaesd independence with UB dressing and SB transfers today.    OT Occupational Profile and History Detailed Assessment- Review of Records and additional review of physical, cognitive, psychosocial history related to current functional performance    Occupational performance deficits (Please refer to evaluation for details): ADL's;IADL's;Leisure;Social Participation    Body Structure / Function / Physical Skills ADL;Decreased knowledge of use of DME;Strength;Pain;Dexterity;UE functional use;Body mechanics;Proprioception;IADL;Endurance;ROM;Improper spinal/pelvic alignment;Mobility;Coordination;Flexibility;Sensation;FMC    Rehab Potential Fair    Clinical Decision Making Multiple treatment options, significant modification of task necessary    Comorbidities Affecting Occupational Performance: May have comorbidities impacting occupational performance    Modification or Assistance to Complete Evaluation  Min-Moderate modification of tasks or assist with assess necessary to complete eval    OT Frequency 2x / week     OT Duration --   for 5 weeks OR 10 visits over 10 weeks (if pt reduces down to 1x/wk)  OT Treatment/Interventions Self-care/ADL training;Moist Heat;Fluidtherapy;DME and/or AE instruction;Splinting;Therapeutic activities;Therapeutic exercise;Coping strategies training;Neuromuscular education;Functional Mobility Training;Passive range of motion;Patient/family education;Paraffin    Plan Supine: stretches for BUE's, functional use of RUE, work on transfers using sliding board    Consulted and Agree with Plan of Care Patient;Family member/caregiver    Family Member Consulted mother and daughter             Patient will benefit from skilled therapeutic intervention in order to improve the following deficits and impairments:   Body Structure / Function / Physical Skills: ADL, Decreased knowledge of use of DME, Strength, Pain, Dexterity, UE functional use, Body mechanics, Proprioception, IADL, Endurance, ROM, Improper spinal/pelvic alignment, Mobility, Coordination, Flexibility, Sensation, FMC       Visit Diagnosis: Other symptoms and signs involving the nervous system  Other symptoms and signs involving the musculoskeletal system  Abnormal posture  Muscle weakness (generalized)  Contracture of left hand  Other lack of coordination    Problem List Patient Active Problem List   Diagnosis Date Noted   Finger osteomyelitis, left ([place]) 09/23/2019   Leukocytosis 09/23/2019    Zachery Conch, OT 10/19/2021, 2:42 PM  [facility] [address] [place], [place], [address] Phone: [phone]   Fax:  [phone]  Name: Carlos [name] MRN: [id number] Date of Birth: 06-22-1966

## 2021-10-21 NOTE — Therapy (Addendum)
[facility] [address] [place], [place], [address] Phone: [phone]   Fax:  [phone]  Physical Therapy Treatment  Patient Details  Name: Carlos [name] MRN: [id number] Date of Birth: 1965-12-21 Referring Provider (PT): Tommas Olp, MD   Encounter Date: 10/19/2021   PT End of Session - 10/21/21 1321     Visit Number 15    Number of Visits 20    Date for PT Re-Evaluation 10/09/21    Authorization Type [employer]    Authorization Time Period 8 visits from 08/09/21 - 10/09/2021    Authorization - Visit Number 4    Authorization - Number of Visits 8    PT Start Time 1400    PT Stop Time 1445    PT Time Calculation (min) 45 min    Equipment Utilized During Treatment Other (comment)   Slideboard   Activity Tolerance Patient tolerated treatment well;Patient limited by fatigue;Patient limited by pain    Behavior During Therapy [facility] for tasks assessed/performed             Past Medical History:  Diagnosis Date   Crush injury to hand 12/2018   bilateral hands with multipe fractures   Finger osteomyelitis, left ([place]) 12/2018    Past Surgical History:  Procedure Laterality Date   I & D EXTREMITY Left 09/23/2019   Procedure: IRRIGATION AND DEBRIDEMENT OF  LEFT LONG FINGER, REVISION OF LEFT LONG FINGER AMPUTATION;  Surgeon: Verner Mould, MD;  Location: [facility];  Service: Orthopedics;  Laterality: Left;   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   Subjective Assessment - 10/21/21 1319     Subjective Pt has not been able to attend multiple therapy sessions due to hypotensive events and then GI issues (constipation and diarrhea - no bleeding).  Very fatigued today.    Patient is accompained by: Family member   son Kaylen   Pertinent History Had posterior C3-C7 laminectomies for excision of intradural intramedullary mass on 11/23/2020. 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    Currently in Pain? Yes                [facility] PT Assessment - 10/21/21 1328       Assessment   Medical Diagnosis spinal cord ependymoma    Referring Provider (PT) Tommas Olp, MD    Onset Date/Surgical Date 11/23/20    Hand Dominance Left    Prior Therapy [facility]      Precautions   Precautions Fall      Prior Function   Level of Independence Independent      Transfers   Transfers Lateral/Scoot Transfers;Sit to Stand    Lateral/Scoot Transfers 2: Max assist;With slide board    Lateral/Scoot Transfer Details (indicate cue type and reason) From mat > w/c using slideboard to R; total A to place slideboard.  Pt unable to initiate scoot across board to w/c; required max A to initiate transfer and max A to completed slideboard transfer into wheelchair due to fatigue.             Patient received from OT seated on mat.  Pt very fatigued, noted to be leaning significantly to L with R trunk shortening and pelvic  drop on L.  Pt requested rest break leaning back against support but did not wish to lie supine on mat.  Placed upside down chair on mat with pillows to provide pt with back support in reclined position for rest breaks throughout session.    Pt did not wish to practice standing today due to fatigue.  Pt noted to be breathing quickly and very shallow.  Focused on slowing RR and increasing length of inhales and exhales during rest breaks.    Pt able to participate in trunk control and postural control training on mat.  Placed pillow under L hip/pelvis to elevate L pelvis and shift weight to R.  Focused on active anterior pelvic tilts with therapist providing manual facilitation and stretch to low back and upper chest to increase upright trunk posture.  Focused  on active anterior tilt and maintaining upright trunk while performing reaching forwards and to the R with RUE on ball to also facilitate L trunk shortening and R trunk elongation with weight shift.  Performed multiple repetitions with rest breaks between due to fatigue.  Performed L lateral leans over pillow roll with support on L elbow while therapist provided manual facilitation of R pelvic depression and L trunk shortening to stretch R low back.    Transferred back to w/c with slideboard and max A to initiate scooting and to complete full transfer across board.  Pt too fatigued to assist today.    PT Short Term Goals - 09/25/21 2115       PT SHORT TERM GOAL #1   Title Pt will be able to stand at [facility] for 2 minutes with min guard in order to demo improved standing tolerance. ALL STGS DUE 09/12/21    Baseline able to perform at session on 09/05/21, stood for a max of 1:30 on 09/25/21    Time 4    Period Weeks    Status Partially Met    Target Date 09/12/21      PT SHORT TERM GOAL #2   Title Pt will be able to perform sliding board transfer from w/c <> mat table with min guard in order to demo improved functional transfers to decr caregiver burden and transfer into his power w/c.    Baseline min A to initiate transfer and min guard for remainder of transfer    Time 4    Period Weeks    Status Partially Met      PT SHORT TERM GOAL #3   Title Pt and pt's family will be independent with exercise program for stretching/ROM in order to build upon functional gains made in therapy.    Baseline --    Time 4    Period Weeks    Status New               PT Long Term Goals - 10/21/21 1324       PT LONG TERM GOAL #1   Title Pt will perform bed mobility with min A in order to demo decr caregiver burden. ALL LTGS DUE 10/10/21    Baseline unable to assess on 12/16 due to pain; mod A on 11/22    Time 8    Period Weeks    Status Unable to assess    Target Date 10/10/21      PT LONG  TERM GOAL #2   Title Pt will perform sliding board transfers with supervision/min guard in order to demo improved functional transfers.    Baseline can require  as little as min A but when fatigued requires max A    Time 8    Period Weeks    Status Not Met      PT LONG TERM GOAL #3   Title Pt will perform sit <> stand in [place] with max A in order to demo improved functional BLE strength for transfers.    Baseline +2 assist for sit >stand in SARA    Time 8    Period Weeks    Status Not Met      PT LONG TERM GOAL #4   Title Pt will be independent with mobility around obstacles and outdoor unlevel surfaces with power w/c in order to demo improved independence.    Baseline pt has not transferred into his power w/c yet for use.    Time 8    Period Weeks    Status Not Met      PT LONG TERM GOAL #5   Title Pt will stand at [employer] vs. Charlaine Dalton for 3 minutes in order to demo improved standing tolerance for ADLs.    Baseline able to stand 1:30 in SARA    Time 8    Period Weeks    Status Partially Met            New goals for date extension:  PT Short Term Goals - 10/21/21 1426       PT SHORT TERM GOAL #1   Title = LTG              PT Long Term Goals - 10/21/21 1422       PT LONG TERM GOAL #1   Title Pt will perform bed mobility with min A in order to demo decr caregiver burden.    Baseline unable to assess on 12/16 due to pain; mod A on 11/22    Time 3    Period Weeks    Status Revised    Target Date 11/03/21      PT LONG TERM GOAL #2   Title Pt will perform sliding board transfers with consistent min A in order to demo improved functional transfers.    Baseline can require as little as min A but when fatigued requires max A    Time 3    Period Weeks    Status Revised    Target Date 11/03/21      PT LONG TERM GOAL #3   Title Pt will perform sit <> stand in SARA and maintain standing x 2 minutes in order to demo improved functional BLE strength for transfers.     Baseline +2 assist for sit >stand in SARA; hold for 1:30    Time 3    Period Weeks    Status Revised    Target Date 11/03/21      PT LONG TERM GOAL #4   Title Pt will be independent with mobility around obstacles and outdoor unlevel surfaces with power w/c in order to demo improved independence.    Baseline pt has not transferred into his power w/c yet for use.    Time 3    Period Weeks    Status Revised    Target Date 11/03/21               Plan - 10/21/21 1338     Clinical Impression Statement Patient has only been able to participate in 4/8 sessions approved.  Pt has been unable to participate in remainder of sessions due to medical issues (  hypotensive episodes and GI issues).  Due to limited ability to participate pt has not met any LTG and continues to make slow progress.  Pt unable to stand today due to fatigue and low back pain and required multiple rest breaks while performing seated trunk control and balance activities today.  Pt was making good progress with transfers and standing but continues to require varying levels of assistance for scooting transfers.  Pt will benefit from continued skilled PT services as pt is able to tolerate to address ongoing impairments and unmet goals.    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    Examination-Activity Limitations Bathing;Bed Mobility;Caring for Others;Locomotion Level;Squat;Dressing;Reach Overhead;Stand;Toileting;Transfers;Hygiene/Grooming;Lift    Examination-Participation Restrictions Cleaning;Community Activity;Laundry;Occupation;Driving;Meal Prep    Stability/Clinical Decision Making Unstable/Unpredictable    Rehab Potential Good    PT Frequency 1x / week    PT  Duration 3 weeks   3 more visits in 2022; will request more visits in new year   PT Treatment/Interventions ADLs/Self Care Home Management;Electrical Stimulation;DME Instruction;Gait training;Functional mobility training;Therapeutic activities;Neuromuscular re-education;Balance training;Therapeutic exercise;Patient/family education;Orthotic Fit/Training;Manual techniques;Passive range of motion;Energy conservation;Vestibular    PT Next Visit Plan Offer visit for Wed 28th at 2.  Recertified for date extension through end of year and then will have to recertify and request more visits in new year.  Schedule more visits.  Seated trunk control training.  Continue to use slideboard but focus on using LUE to push and lifting hips as he scoots.  May need to warm up with SCI Fit - LE and RUE from w/c.  Standing with SARA and Medium GREEN sling around back to assist with bringing COG over BOS.  Work on weight shifting, LE activation in standing and possibly taking steps    Consulted and Agree with Plan of Care Patient;Family member/caregiver    Family Member Consulted pt's mom             Patient will benefit from skilled therapeutic intervention in order to improve the following deficits and impairments:  Decreased activity tolerance, Decreased balance, Decreased coordination, Decreased endurance, Decreased mobility, Decreased range of motion, Difficulty walking, Decreased strength, Hypomobility, Increased muscle spasms, Impaired flexibility, Impaired tone, Impaired sensation, Impaired UE functional use, Postural dysfunction  Visit Diagnosis: Other symptoms and signs involving the nervous system  Muscle weakness (generalized)  Difficulty in walking, not elsewhere classified     Problem List Patient Active Problem List   Diagnosis Date Noted   Finger osteomyelitis, left ([place]) 09/23/2019   Leukocytosis 09/23/2019   Rico Junker, PT, DPT 10/21/21    2:26 PM    [facility] [address] [place], [place], [address] Phone: [phone]   Fax:  [phone]  Name: Catherine Oak MRN: [id number] Date of Birth: 04-23-1966  [place]   Choose one: Neuro Rehabilitative  Standardized Assessment or Functional Outcome Tool: Other Transfers: varies from min-max A  Score or Percent Disability: 75%  Body Parts Treated (Select each separately):  Other R and LLE . Overall deficits/functional limitations for body part selected: severe Lumbopelvic. Overall deficits/functional limitations for body part selected: severe  Managed medicaid CPT codes: [id number]- Therapeutic Exercise, [phone]- Neuro Re-education, [phone] - Gait Training, [phone] - Manual Therapy, 97530 - Therapeutic Activities, 97535 - Self Care, [id number] - Electrical stimulation (unattended), and [id number] -  Electrical stimulation (Manual)

## 2021-10-22 DIAGNOSIS — G8252 Quadriplegia, C1-C4 incomplete: Secondary | ICD-10-CM | POA: Diagnosis not present

## 2021-10-23 DIAGNOSIS — G8252 Quadriplegia, C1-C4 incomplete: Secondary | ICD-10-CM | POA: Diagnosis not present

## 2021-10-24 DIAGNOSIS — G8252 Quadriplegia, C1-C4 incomplete: Secondary | ICD-10-CM | POA: Diagnosis not present

## 2021-10-25 DIAGNOSIS — G8252 Quadriplegia, C1-C4 incomplete: Secondary | ICD-10-CM | POA: Diagnosis not present

## 2021-10-26 ENCOUNTER — Ambulatory Visit: Payer: Medicaid Other | Admitting: Physical Therapy

## 2021-10-26 ENCOUNTER — Ambulatory Visit: Payer: Medicaid Other | Admitting: Occupational Therapy

## 2021-10-26 DIAGNOSIS — G8252 Quadriplegia, C1-C4 incomplete: Secondary | ICD-10-CM | POA: Diagnosis not present

## 2021-10-29 IMAGING — DX DG FINGER MIDDLE 2+V*L*
3 series · 3 of 3 positions shown · non-contrast
Comparison: None.

CLINICAL DATA: Pain and swelling distally

EXAM:
LEFT THIRD FINGER 2+V

[x finger pa left]
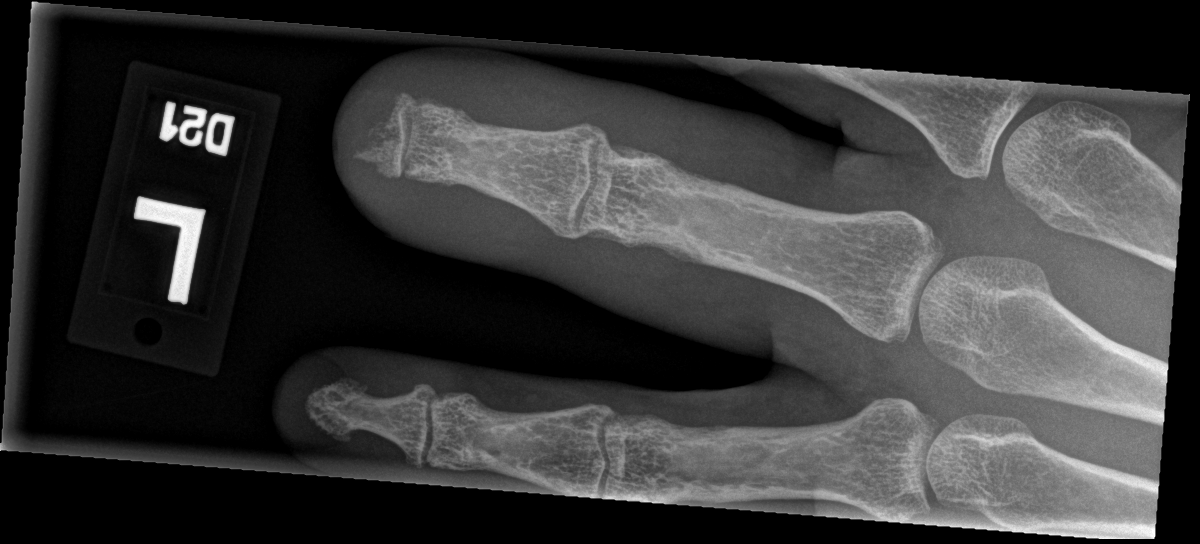

[x finger obl left]
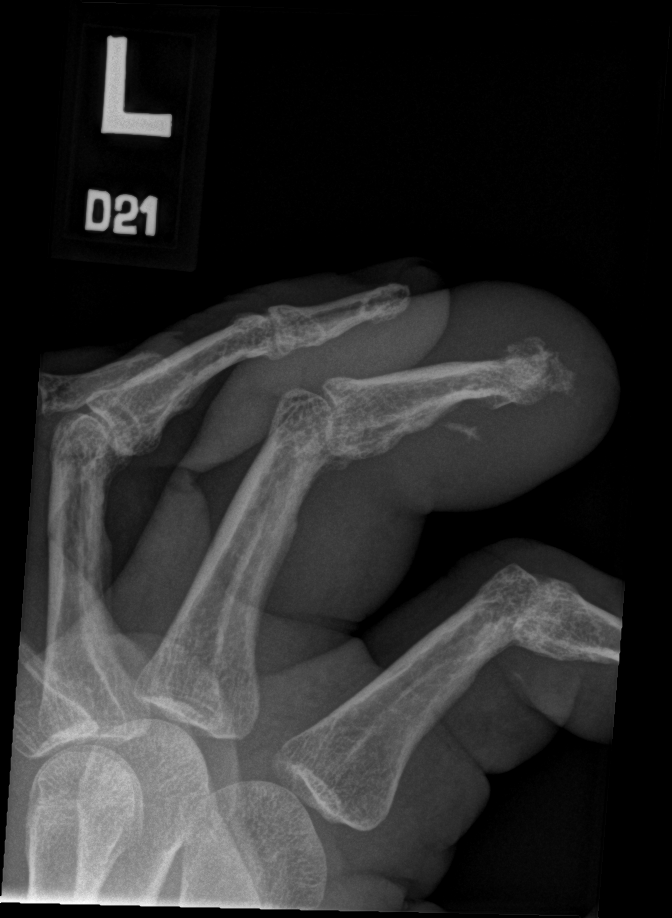

[x finger lat left]
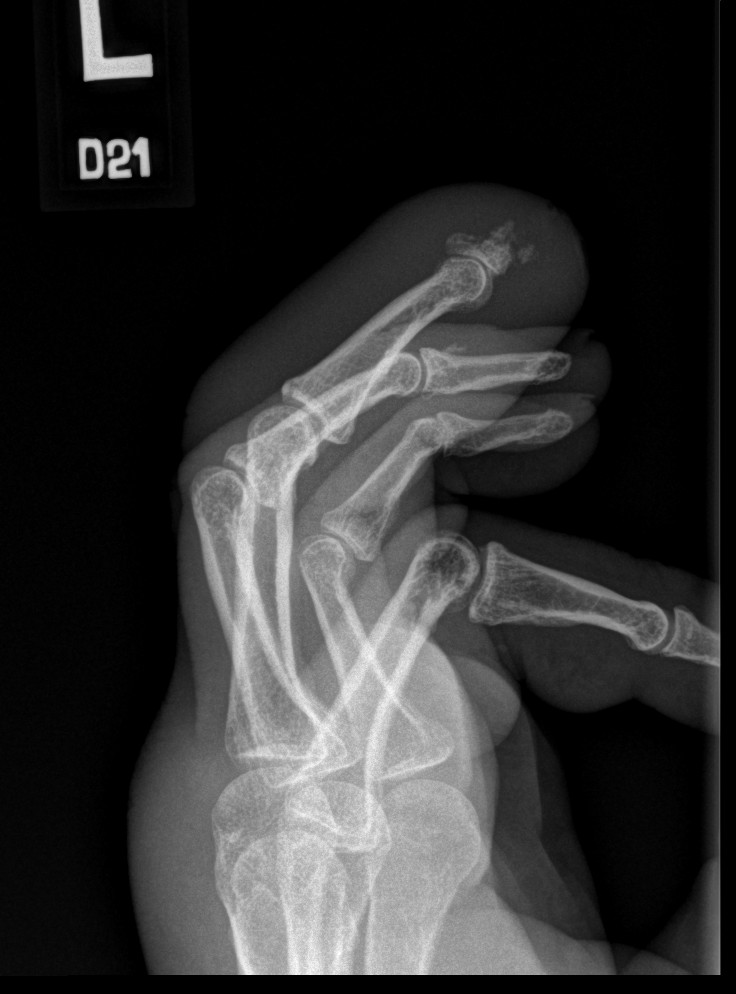

[3 of 3 positions shown; findings below may reference images not displayed]

FINDINGS: Frontal, oblique, and lateral views were obtained. There is been
previous amputation of most of the third distal phalanx. Within the
area of remaining distal third phalanx, there is an area of apparent
loss of cortex which is concerning for potential underlying
osteomyelitis. This area of suspected bony destruction does not
extend to the joint space. There are several small bony fragments in
this area which are probably of posttraumatic etiology. There is a
focus of calcification volar to the midportion of the third middle
phalanx which may represent residua of prior trauma.

Bones elsewhere appear intact. No acute fracture or dislocation. No
joint space narrowing or erosive change. No soft tissue air.
IMPRESSION: Status post previous amputation of most of the third distal phalanx.
There is an area of loss of cortex in this area of amputation which
must be of concern for focal osteomyelitis. This small focus of
apparent bony destruction does not reach the DIP joint space. There
are fragments of bone in this area which are likely of posttraumatic
etiology. A small focus of calcification volar to the third middle
phalanx is likely of posttraumatic etiology.

No acute fracture or dislocation. No joint space narrowing or joint
erosion.

From an imaging standpoint, MR potentially could be helpful to
further evaluate this area in the third distal phalanx remnant
concerning for small focus of destruction.

## 2021-10-31 ENCOUNTER — Ambulatory Visit: Payer: Self-pay | Admitting: Physical Therapy

## 2021-11-04 DIAGNOSIS — Z419 Encounter for procedure for purposes other than remedying health state, unspecified: Secondary | ICD-10-CM | POA: Diagnosis not present

## 2021-11-05 DIAGNOSIS — G8252 Quadriplegia, C1-C4 incomplete: Secondary | ICD-10-CM | POA: Diagnosis not present

## 2021-11-06 ENCOUNTER — Ambulatory Visit: Payer: Medicaid Other | Admitting: Physical Therapy

## 2021-11-06 DIAGNOSIS — G8252 Quadriplegia, C1-C4 incomplete: Secondary | ICD-10-CM | POA: Diagnosis not present

## 2021-11-07 ENCOUNTER — Other Ambulatory Visit: Payer: Self-pay

## 2021-11-07 ENCOUNTER — Encounter: Payer: Self-pay | Admitting: Physical Therapy

## 2021-11-07 ENCOUNTER — Ambulatory Visit: Payer: Medicaid Other | Attending: Neurology | Admitting: Physical Therapy

## 2021-11-07 DIAGNOSIS — G8252 Quadriplegia, C1-C4 incomplete: Secondary | ICD-10-CM | POA: Diagnosis not present

## 2021-11-07 DIAGNOSIS — R262 Difficulty in walking, not elsewhere classified: Secondary | ICD-10-CM

## 2021-11-07 DIAGNOSIS — M6281 Muscle weakness (generalized): Secondary | ICD-10-CM | POA: Diagnosis not present

## 2021-11-07 DIAGNOSIS — R29818 Other symptoms and signs involving the nervous system: Secondary | ICD-10-CM | POA: Diagnosis not present

## 2021-11-07 DIAGNOSIS — R293 Abnormal posture: Secondary | ICD-10-CM

## 2021-11-07 DIAGNOSIS — R29898 Other symptoms and signs involving the musculoskeletal system: Secondary | ICD-10-CM

## 2021-11-07 NOTE — Therapy (Addendum)
[facility] [address] [place], [place], [address] Phone: [phone]   Fax:  [phone]  Physical Therapy Treatment/Re-Cert  Patient Details  Name: Carlos [name] MRN: [id number] Date of Birth: 1965/12/19 Referring Provider (PT): Tommas Olp, MD   Encounter Date: 11/07/2021   PT End of Session - 11/07/21 1606     Visit Number 16    Number of Visits 24    Date for PT Re-Evaluation 02/05/22    Authorization Type [employer]    Authorization Time Period 8 visits from 08/09/21 - 10/09/2021; 3 visits approved 10/26/21 - 11/23/21, needs to request additional auth    Authorization - Visit Number 5    Authorization - Number of Visits 8    PT Start Time 1017    PT Stop Time 1534    PT Time Calculation (min) 46 min    Equipment Utilized During Treatment --   Slideboard, Sara Plus   Activity Tolerance Patient tolerated treatment well;Patient limited by pain    Behavior During Therapy [facility] for tasks assessed/performed             Past Medical History:  Diagnosis Date   Crush injury to hand 12/2018   bilateral hands with multipe fractures   Finger osteomyelitis, 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   Subjective Assessment - 11/07/21 1449     Subjective Got a new CNA  about a month ago - is helping with more with things at home. Sees the GI doctor next week on the 12th.    Patient is accompained by: Family member   son Dex   Pertinent History Had posterior C3-C7 laminectomies for excision of intradural intramedullary mass on 11/23/2020. 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    Currently in Pain? No/denies                11/07/21 1603  Assessment  Medical Diagnosis spinal cord ependymoma  Referring Provider (PT) Tommas Olp, MD  Onset Date/Surgical Date 11/23/20  Hand Dominance Left  [facility]  Precautions  Precautions Fall  Prior Function  Level of Independence Independent  Transfers  Transfers Lateral/Scoot Transfers;Sit to Stand  Sit to Stand 2: Max assist  Sit to Stand Details Verbal cues for sequencing;Verbal cues for technique;Manual facilitation for weight shifting;Manual facilitation for placement  Sit to Stand Details (indicate cue type and reason) Use of Clarise Cruz plus with no foot plate and green sling. Performed from elevated mat table. Pt with incr tightness/spasticity in LUE today, took incr time for proper placement to hold onto handle. Verbal/manual cues for incr forward weight shift to stand and to push through legs to help stand. Only able to stand for approx ~10 seconds due to pt reporting to sit back down to mat table due to incr pain. Removed shin plate and had OT help assist block RLE and PT blocking LLE  to try to stand without plate for improved comfort and hopefully to help with bilat knee extension. Attempted x2 reps of trying to stand without shin plate, only able to come halfway (pt showing no activation through BLE) and then pt requesting to sit down due to increased pain.  Stand to Sit 2: Max assist  Stand to Sit Details Use of Clarise Cruz plus for controlled descent back to mat table.  Lateral/Scoot Transfers 3: Mod assist;With Press photographer Details (indicate cue type and reason) With use of sliding board (initially going to L and coming back to R), needs total A for sliding board  placement, cues to help push using RUE and for head/hips relationship. Once on board pt needing min/mod A for scooting today. Needs assistance for proper BLE positioning during transfer.  Comments Needs mod A to help assist with forward/backwards scooting in w/c and onto mat table for improved positioning            Therapeutic Activites   Therapeutic Activities Other Therapeutic Activities  Other Therapeutic Activities Had discussion about POC going forwards and Medicaid visit limit into the new year. Would like to continue with doing 1x week and schedule for 8 weeks initially, pt asking about  aquatic therapy. Had discussion about criteria for aquatic therapy and safety concerns/will need to have a 2nd therapist there in the pool during session. Therapist to discuss with aquatic therapists. Pt stating that he will just go to the [facility] and get in the pool with his family - discussed that this is not safe at this time and someone could injure themself trying this. Pt has new CNA that has been more involved - discussed having new CNA come in to future sessions for education and to help work on sliding board transfers to perform with pt at home for improved independence with transfers, educated on working on stretching/exercises daily with CNA, and discussed trying to get CNA to transfer him into power w/c at home for incr independence/improved positioning in this chair at home vs. sitting in his manual chair or the bed. Pt adamant that he does not want to use his power w/c and feels as if this is taking a step backwards. Educated with pt the pros of using his power w/c       PT Education - 11/07/21 1605     Education Details See therapuetic activity section. Scheduling an additional 1x week for 8 weeks in the [place] Year due to Medicaid visits restarting and conserving visits for the year.    Person(s) Educated Patient;Spouse    Methods Explanation    Comprehension Verbalized understanding               PT Short Term Goals - 10/21/21 1426       PT SHORT TERM GOAL #1   Title = LTG            Updated STGs:  PT Short Term Goals - 11/09/21 1114       PT SHORT TERM GOAL #1   Title Pt will be able to perform sliding board transfer from w/c <> mat table with min/mod A with CNA in order to demo improved functional transfers to decr caregiver burden.    Baseline needs min/mod A with therapist in session    Time 4    Period Weeks    Status New    Target Date 12/07/21      PT SHORT TERM GOAL #2   Title  Pt will be independent with progressive HEP with CNA/ family members for strength, ROM.    Baseline not performing stretches/HEP consistently.    Time 4    Period Weeks    Status New    Target Date 12/07/21                PT Long Term Goals - 11/09/21 0837       PT LONG TERM GOAL #1   Title Pt will perform bed mobility with min A in order to demo decr caregiver burden.    Baseline did not have time to assess bed mobility on 09/07/21 due to time constraints; mod A on 11/22    Time 3    Period Weeks    Status Deferred    Target Date 11/03/21      PT LONG TERM GOAL #2   Title Pt will perform sliding board transfers with consistent min A in order to demo improved functional transfers.    Baseline pt needing min/mod A on 11/07/21, performance varies with sliding board transfers when fatigued    Time 3    Period Weeks    Status Not Met    Target Date 11/03/21      PT LONG TERM GOAL #3   Title Pt will perform sit <> stand in SARA and maintain standing x 2 minutes in order to demo improved functional BLE strength for transfers.    Baseline only able to stand for 10 seconds today before needing to sit due to incr pain    Time 3    Period Weeks    Status Not Met    Target Date 11/03/21      PT LONG TERM GOAL #4   Title Pt will be independent with mobility around obstacles and outdoor unlevel surfaces with power w/c in order to demo improved independence.     Baseline pt has not transferred into his power w/c yet for use - had discussion on 11/07/21 about importance of using power w/c    Time 3    Period Weeks    Status Not Met    Target Date 11/03/21            Updated LTGs:  PT Long Term Goals - 11/09/21 1118       PT LONG TERM GOAL #1   Title Pt will perform bed mobility with min A in order to demo decr caregiver burden. ALL LTGS DUE 01/04/22    Baseline did not have time to assess bed mobility on 09/07/21 due to time constraints; mod A on 11/22    Time 8    Period Weeks    Status On-going    Target Date 01/04/22      PT LONG TERM GOAL #2   Title Pt will perform sliding board transfers with min guard/min A consistently  in order to demo improved functional transfers at home.    Baseline pt needing min/mod A on 11/07/21, performance varies with sliding board transfers when fatigued    Time 8    Period Weeks    Status Revised    Target Date 01/04/22      PT LONG TERM GOAL #3   Title Pt will perform sit <> stand in SARA and maintain standing x 2 minutes in order to demo improved functional BLE strength for transfers and improved standing tolerance.    Baseline only able to stand for 10 seconds today before needing to sit due to incr pain  Time 8    Period Weeks    Status On-going    Target Date 01/04/22      PT LONG TERM GOAL #4   Title Pt will initiate aquatic therapy if deemed appropriate/safe.    Baseline will determine if aquatic therapy is safe/appropriate for patient at this time.    Time 8    Period Weeks    Status On-going    Target Date 01/04/22      PT LONG TERM GOAL #5   Title Pt will be able to take 2 steps in [place] with max A of 1-2 therapists in order to demo improved standing tolerance/and progression towards pre-gait training.    Baseline have been unable to try taking steps in [place]    Time 8    Period Weeks    Status New      Additional Long Term Goals   Additional Long Term Goals Yes      PT LONG  TERM GOAL #6   Title Pt will report being able to transfer into power w/c at home with CNA in order to demo improved independence/improved positioning throughout the day.    Baseline pt not using power w/c    Time 8    Period Weeks    Status New               11/09/21 1006  Plan  Clinical Impression Statement Patient returns to PT today and this is patient's 4th PT session in 2 months. Pt has been missed some visits/been unable to participate due to medical issues (hypotensive episodes and GI issues) as well as difficulties with transportation. Due to pt having to miss PT visits, pt has made slow progress and has not met any of his LTGs. Pt needing total A for sliding board placement and min/mod A for sliding board transfer to and from mat table. Pt's level of assistance depends on pt's fatigue level. When pt was able to make PT appointments more consistently, pt was able to perform with min guard/min A. Attempted to perform standing with Clarise Cruz Plus today, but unable to stand for >10 seconds due to reports of incr pain. Prior to today, have not been able to perform standing in therapy with pt since 09/25/21 (has only been seen for one other visit since then on 10/21/21 when pt was too fatigued). Pt is interested in participating in aquatic therapy, discussed will talk to aquatic therapists to determine if this would be safe/appropriate for pt at this time. Pt has also gotten a new CNA in the last month that pt reports is more involved with exercises at home, discussed having them come to future appts for training/education with transfers to help improve pt's independent at home.  Pt will benefit from continued skilled PT services as pt is able to tolerate to address ongoing impiarments and improve functional mobility and transfers to improve independence and decr caregiver burden. Goals updated as appropriate and will request additional Medicaid visits.  Personal Factors and Comorbidities Comorbidity  3+;Time since onset of injury/illness/exacerbation;Past/Current Experience;Behavior Pattern  Comorbidities excision of intradural intramedullary mass on 11/23/2020. 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  Examination-Activity Limitations Bathing;Bed Mobility;Caring for Others;Locomotion Level;Squat;Dressing;Reach Overhead;Stand;Toileting;Transfers;Hygiene/Grooming;Lift  Examination-Participation Restrictions Cleaning;Community Activity;Laundry;Occupation;Driving;Meal Prep  Pt will benefit from skilled therapeutic intervention in order to improve on the following deficits Decreased activity tolerance;Decreased balance;Decreased coordination;Decreased endurance;Decreased mobility;Decreased range of motion;Difficulty walking;Decreased strength;Hypomobility;Increased muscle spasms;Impaired flexibility;Impaired tone;Impaired sensation;Impaired UE  functional use;Postural dysfunction  Stability/Clinical Decision Making Unstable/Unpredictable  Clinical Decision Making High  Rehab Potential Good  PT Frequency 1x / week  PT Duration 12 weeks  PT Treatment/Interventions ADLs/Self Care Home Management;Electrical Stimulation;DME Instruction;Gait training;Functional mobility training;Therapeutic activities;Neuromuscular re-education;Balance training;Therapeutic exercise;Patient/family education;Orthotic Fit/Training;Manual techniques;Passive range of motion;Energy conservation;Vestibular  PT Next Visit Plan Asked for pt's CNA to see if they can come to session - if  so work on slide board transfers, reviewing/updating HEP as appropriate. Seated trunk control training.  Continue to use slideboard for transfers.  May need to warm up with SCI Fit - LE and RUE from w/c.  Standing with SARA and Medium GREEN sling around back to assist with bringing COG over BOS.  Bed mobility. Work on weight shifting, LE activation in standing and standing tolerance. any update on aquatic  therapy?  PT Home Exercise Plan [id number] plus seated glute squeezes, LAQs, hip ADD squeezes, heel toe raises  Consulted and Agree with Plan of Care Patient;Family member/caregiver  Family Member Consulted pt's mom      Patient will benefit from skilled therapeutic intervention in order to improve the following deficits and impairments:     Visit Diagnosis: Other symptoms and signs involving the nervous system  Other symptoms and signs involving the musculoskeletal system  Abnormal posture  Muscle weakness (generalized)  Difficulty in walking, not elsewhere classified     Problem List Patient Active Problem List   Diagnosis Date Noted   Finger osteomyelitis, left ([place]) 09/23/2019   Leukocytosis 09/23/2019    Arliss Journey, PT, DPT  11/07/2021, 4:08 PM  [facility] [address] [place], [place], [address] Phone: [phone]   Fax:  [phone]  Name: Previn Jian MRN: [id number] Date of Birth: [birth year]

## 2021-11-08 DIAGNOSIS — G8252 Quadriplegia, C1-C4 incomplete: Secondary | ICD-10-CM | POA: Diagnosis not present

## 2021-11-09 DIAGNOSIS — G8252 Quadriplegia, C1-C4 incomplete: Secondary | ICD-10-CM | POA: Diagnosis not present

## 2021-11-09 NOTE — Addendum Note (Signed)
Addended by: Arliss Journey on: 11/09/2021 11:23 AM   Modules accepted: Orders

## 2021-11-10 DIAGNOSIS — G8252 Quadriplegia, C1-C4 incomplete: Secondary | ICD-10-CM | POA: Diagnosis not present

## 2021-11-11 DIAGNOSIS — G8252 Quadriplegia, C1-C4 incomplete: Secondary | ICD-10-CM | POA: Diagnosis not present

## 2021-11-12 ENCOUNTER — Ambulatory Visit: Payer: Medicaid Other | Admitting: Occupational Therapy

## 2021-11-12 DIAGNOSIS — G8252 Quadriplegia, C1-C4 incomplete: Secondary | ICD-10-CM | POA: Diagnosis not present

## 2021-11-13 DIAGNOSIS — G8252 Quadriplegia, C1-C4 incomplete: Secondary | ICD-10-CM | POA: Diagnosis not present

## 2021-11-14 DIAGNOSIS — G8252 Quadriplegia, C1-C4 incomplete: Secondary | ICD-10-CM | POA: Diagnosis not present

## 2021-11-15 DIAGNOSIS — G8252 Quadriplegia, C1-C4 incomplete: Secondary | ICD-10-CM | POA: Diagnosis not present

## 2021-11-15 DIAGNOSIS — R109 Unspecified abdominal pain: Secondary | ICD-10-CM | POA: Diagnosis not present

## 2021-11-15 DIAGNOSIS — R198 Other specified symptoms and signs involving the digestive system and abdomen: Secondary | ICD-10-CM | POA: Diagnosis not present

## 2021-11-15 DIAGNOSIS — Z1211 Encounter for screening for malignant neoplasm of colon: Secondary | ICD-10-CM | POA: Diagnosis not present

## 2021-11-15 DIAGNOSIS — R6881 Early satiety: Secondary | ICD-10-CM | POA: Diagnosis not present

## 2021-11-16 ENCOUNTER — Ambulatory Visit: Payer: Medicaid Other | Admitting: Physical Therapy

## 2021-11-16 DIAGNOSIS — G8252 Quadriplegia, C1-C4 incomplete: Secondary | ICD-10-CM | POA: Diagnosis not present

## 2021-11-17 DIAGNOSIS — G8252 Quadriplegia, C1-C4 incomplete: Secondary | ICD-10-CM | POA: Diagnosis not present

## 2021-11-18 DIAGNOSIS — G8252 Quadriplegia, C1-C4 incomplete: Secondary | ICD-10-CM | POA: Diagnosis not present

## 2021-11-19 DIAGNOSIS — G8252 Quadriplegia, C1-C4 incomplete: Secondary | ICD-10-CM | POA: Diagnosis not present

## 2021-11-20 ENCOUNTER — Ambulatory Visit: Payer: Medicaid Other | Admitting: Physical Therapy

## 2021-11-20 DIAGNOSIS — G8252 Quadriplegia, C1-C4 incomplete: Secondary | ICD-10-CM | POA: Diagnosis not present

## 2021-11-21 DIAGNOSIS — G8252 Quadriplegia, C1-C4 incomplete: Secondary | ICD-10-CM | POA: Diagnosis not present

## 2021-11-22 DIAGNOSIS — G8252 Quadriplegia, C1-C4 incomplete: Secondary | ICD-10-CM | POA: Diagnosis not present

## 2021-11-23 DIAGNOSIS — G8250 Quadriplegia, unspecified: Secondary | ICD-10-CM | POA: Diagnosis not present

## 2021-11-23 DIAGNOSIS — K2970 Gastritis, unspecified, without bleeding: Secondary | ICD-10-CM | POA: Diagnosis not present

## 2021-11-23 DIAGNOSIS — G8929 Other chronic pain: Secondary | ICD-10-CM | POA: Diagnosis not present

## 2021-11-23 DIAGNOSIS — G8252 Quadriplegia, C1-C4 incomplete: Secondary | ICD-10-CM | POA: Diagnosis not present

## 2021-11-23 DIAGNOSIS — D434 Neoplasm of uncertain behavior of spinal cord: Secondary | ICD-10-CM | POA: Diagnosis not present

## 2021-11-23 DIAGNOSIS — Z79899 Other long term (current) drug therapy: Secondary | ICD-10-CM | POA: Diagnosis not present

## 2021-11-23 DIAGNOSIS — K3189 Other diseases of stomach and duodenum: Secondary | ICD-10-CM | POA: Diagnosis not present

## 2021-11-23 DIAGNOSIS — Z1211 Encounter for screening for malignant neoplasm of colon: Secondary | ICD-10-CM | POA: Diagnosis not present

## 2021-11-23 DIAGNOSIS — R1084 Generalized abdominal pain: Secondary | ICD-10-CM | POA: Diagnosis not present

## 2021-11-23 DIAGNOSIS — K2960 Other gastritis without bleeding: Secondary | ICD-10-CM | POA: Diagnosis not present

## 2021-11-23 DIAGNOSIS — R6881 Early satiety: Secondary | ICD-10-CM | POA: Diagnosis not present

## 2021-11-23 DIAGNOSIS — Z882 Allergy status to sulfonamides status: Secondary | ICD-10-CM | POA: Diagnosis not present

## 2021-11-23 DIAGNOSIS — K449 Diaphragmatic hernia without obstruction or gangrene: Secondary | ICD-10-CM | POA: Diagnosis not present

## 2021-11-23 DIAGNOSIS — Z538 Procedure and treatment not carried out for other reasons: Secondary | ICD-10-CM | POA: Diagnosis not present

## 2021-11-24 DIAGNOSIS — G8252 Quadriplegia, C1-C4 incomplete: Secondary | ICD-10-CM | POA: Diagnosis not present

## 2021-11-25 DIAGNOSIS — G8252 Quadriplegia, C1-C4 incomplete: Secondary | ICD-10-CM | POA: Diagnosis not present

## 2021-11-26 DIAGNOSIS — G8252 Quadriplegia, C1-C4 incomplete: Secondary | ICD-10-CM | POA: Diagnosis not present

## 2021-11-27 DIAGNOSIS — G8252 Quadriplegia, C1-C4 incomplete: Secondary | ICD-10-CM | POA: Diagnosis not present

## 2021-11-28 ENCOUNTER — Other Ambulatory Visit: Payer: Self-pay

## 2021-11-28 ENCOUNTER — Encounter: Payer: Self-pay | Admitting: Physical Therapy

## 2021-11-28 ENCOUNTER — Ambulatory Visit: Payer: Medicaid Other | Admitting: Physical Therapy

## 2021-11-28 VITALS — BP 127/88 | HR 95

## 2021-11-28 DIAGNOSIS — R29898 Other symptoms and signs involving the musculoskeletal system: Secondary | ICD-10-CM

## 2021-11-28 DIAGNOSIS — R262 Difficulty in walking, not elsewhere classified: Secondary | ICD-10-CM | POA: Diagnosis not present

## 2021-11-28 DIAGNOSIS — M6281 Muscle weakness (generalized): Secondary | ICD-10-CM | POA: Diagnosis not present

## 2021-11-28 DIAGNOSIS — R29818 Other symptoms and signs involving the nervous system: Secondary | ICD-10-CM

## 2021-11-28 DIAGNOSIS — R293 Abnormal posture: Secondary | ICD-10-CM | POA: Diagnosis not present

## 2021-11-28 DIAGNOSIS — G8252 Quadriplegia, C1-C4 incomplete: Secondary | ICD-10-CM | POA: Diagnosis not present

## 2021-11-28 NOTE — Therapy (Signed)
[facility] [address] [place], [place], [address] Phone: [phone]   Fax:  [phone]  Physical Therapy Treatment  Patient Details  Name: Carlos [name] MRN: [id number] Date of Birth: May 29, 1966 Referring Provider (PT): Tommas Olp, MD   Encounter Date: 11/28/2021   PT End of Session - 11/28/21 1321     Visit Number 17    Number of Visits 24    Date for PT Re-Evaluation 02/05/22    Authorization Type [employer]    Authorization Time Period 8 visits from 08/09/21 - 10/09/2021; 3 visits approved 10/26/21 - 11/23/21, needs to request additional auth    Authorization - Visit Number 1    Authorization - Number of Visits 8    PT Start Time 1316    PT Stop Time 1400    PT Time Calculation (min) 44 min    Equipment Utilized During Treatment --   Slideboard, Sara Plus   Activity Tolerance Patient tolerated treatment well;Patient limited by pain    Behavior During Therapy [facility] for tasks assessed/performed             Past Medical History:  Diagnosis Date   Crush injury to hand 12/2018   bilateral hands with multipe fractures   Finger osteomyelitis, 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 Unfortunately had to miss his Botox appt due to transportation.    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    Currently in Pain? No/denies                               [facility] Adult PT Treatment/Exercise - 11/28/21 1526       Transfers   Transfers Lateral/Scoot Transfers;Sit to Stand    Sit to Stand 1: +1 Total assist    Sit to Stand Details Verbal cues for sequencing;Verbal cues for technique;Manual facilitation for weight shifting;Manual facilitation for placement    Sit to Stand Details (indicate cue type and reason) Use of Clarise Cruz plus - used purple leg sling for extra support. Had OT present to help steady Clarise Cruz plus with standing. Before standing worked on stretching out LUE due to significant tightness/spasticity. Verbal/manual cues to shift more weight onto L hip. Unable to put LUE fully on arm rest and around hand grip today. When standing, cued for incr forward lean. Performed 2 bouts of standing today x3-4 minutes each. 1st bout worked on posture with verbal/tactile/manual cues to bring belly button forwards x10 reps and an additional x10 reps for glute activation with cues to really push legs down into ground  and think about activating legs. During 2nd bout continued to work on tall and midline posture with gentle weight shifting to R/L with tactile/manual cues at pt's hips x8 reps each side. Pt with more hip/trunk flexion during 2nd bout due to more fatigue. Intermittent cues throughout to push legs into ground and lift top of head towards ceiling for posture.    Stand to Sit 1: +1 Total assist    Stand to Sit Details Use of Clarise Cruz plus for controlled descent back to mat    Lateral/Scoot Transfers 4: Min assist;3: Mod assist    Lateral/Scoot Transfer Details (indicate cue type and reason) With use of sliding board (initially going to L and coming back to R), needs total A for sliding board placement, cues to  help push using RUE and for head/hips relationship. Once on board needing min guard/min A from w/c > mat. At end of session from mat > w/c pt needing min/mod A to assist for transfer due to fatigue.  Needs assistance for proper BLE positioning during transfer.    Comments When seated at edge of mat table, pt able to initiate scooting posteriorly slightly onto mat with min guard. Needing mod A to scoot back into w/c after sliding board transfer.      Therapeutic Activites    Therapeutic Activities Other Therapeutic Activities    Other Therapeutic Activities Provided pt with peanut butter crackers and water after transfer to mat due to pt reporting that he has not eaten today or had any water. Pt's vitals WFL and not feeling lightheaded. Educated on importance of staying hydrated and eating before therapy.                       PT Short Term Goals - 11/09/21 1114       PT SHORT TERM GOAL #1   Title Pt will be able to perform sliding board transfer from w/c <> mat table with min/mod A with CNA in order to demo improved functional transfers to decr caregiver burden.    Baseline needs min/mod A with therapist in session    Time 4    Period Weeks    Status New    Target Date 12/07/21      PT SHORT TERM GOAL #2   Title Pt will be independent with progressive HEP with CNA/ family members for strength, ROM.    Baseline not performing stretches/HEP consistently.    Time 4    Period Weeks    Status New    Target Date 12/07/21               PT Long Term Goals - 11/09/21 1118       PT LONG TERM GOAL #1   Title Pt will perform bed mobility with min A in order to demo decr caregiver burden. ALL LTGS DUE 01/04/22    Baseline did not have time to assess bed mobility on 09/07/21 due to time constraints; mod A on 11/22    Time 8    Period Weeks    Status On-going    Target Date 01/04/22      PT LONG TERM GOAL #2   Title Pt will perform sliding board transfers with min guard/min  A consistently  in order to demo improved functional transfers at home.    Baseline pt needing min/mod A on 11/07/21, performance varies with sliding board transfers when fatigued    Time 8    Period Weeks  Status Revised    Target Date 01/04/22      PT LONG TERM GOAL #3   Title Pt will perform sit <> stand in SARA and maintain standing x 2 minutes in order to demo improved functional BLE strength for transfers and improved standing tolerance.    Baseline only able to stand for 10 seconds today before needing to sit due to incr pain    Time 8    Period Weeks    Status On-going    Target Date 01/04/22      PT LONG TERM GOAL #4   Title Pt will initiate aquatic therapy if deemed appropriate/safe.    Baseline will determine if aquatic therapy is safe/appropriate for patient at this time.    Time 8    Period Weeks    Status On-going    Target Date 01/04/22      PT LONG TERM GOAL #5   Title Pt will be able to take 2 steps in [place] with max A of 1-2 therapists in order to demo improved standing tolerance/and progression towards pre-gait training.    Baseline have been unable to try taking steps in [place]    Time 8    Period Weeks    Status New      Additional Long Term Goals   Additional Long Term Goals Yes      PT LONG TERM GOAL #6   Title Pt will report being able to transfer into power w/c at home with CNA in order to demo improved independence/improved positioning throughout the day.    Baseline pt not using power w/c    Time 8    Period Weeks    Status New                   Plan - 11/28/21 1634     Clinical Impression Statement When transferring to mat today, pt needing min guard/min A for sliding board transfer once on the board. At end of session when more fatigued from mat > w/c, pt needing min/mod A. Worked on standing tolerance, weight bearing, and stretching in erect position today with use of Sara Plus. Utilized leg sling today for extra support. Pt able  to stand for 3-4 minutes at a time with focus on extensor activation and weight shifting. Pt did report incr tightness in standing, but was able to tolerate. Will continue to progress towards LTGs.    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    Examination-Activity Limitations Bathing;Bed Mobility;Caring for Others;Locomotion Level;Squat;Dressing;Reach Overhead;Stand;Toileting;Transfers;Hygiene/Grooming;Lift    Examination-Participation Restrictions Cleaning;Community Activity;Laundry;Occupation;Driving;Meal Prep    Stability/Clinical Decision Making Unstable/Unpredictable    Rehab Potential Good    PT Frequency 1x / week    PT Duration 12 weeks    PT Treatment/Interventions ADLs/Self Care Home Management;Electrical Stimulation;DME Instruction;Gait training;Functional mobility training;Therapeutic activities;Neuromuscular re-education;Balance training;Therapeutic exercise;Patient/family education;Orthotic Fit/Training;Manual techniques;Passive range of motion;Energy conservation;Vestibular    PT Next Visit Plan Asked for pt's CNA to see if they can come to session - if  so work on slide board transfers, reviewing/updating HEP as appropriate. Seated trunk control training.  Continue to use slideboard for transfers.  May need to warm up with SCI Fit - LE and RUE from w/c.  Standing with SARA - using leg sling. Work on [employer] shifting, LE  activation in standing and standing tolerance. any update on aquatic therapy?    PT Home Exercise Plan [id number] plus seated glute squeezes, LAQs, hip ADD squeezes, heel toe raises    Consulted and Agree with Plan of Care Patient;Family member/caregiver    Family Member Consulted pt's mom              Patient will benefit from skilled therapeutic intervention in order to improve the following deficits and impairments:  Decreased activity tolerance, Decreased balance, Decreased coordination, Decreased endurance, Decreased mobility, Decreased range of motion, Difficulty walking, Decreased strength, Hypomobility, Increased muscle spasms, Impaired flexibility, Impaired tone, Impaired sensation, Impaired UE functional use, Postural dysfunction  Visit Diagnosis: Other symptoms and signs involving the nervous system  Other symptoms and signs involving the musculoskeletal system  Abnormal posture  Muscle weakness (generalized)     Problem List Patient Active Problem List   Diagnosis Date Noted   Finger osteomyelitis, left ([place]) 09/23/2019   Leukocytosis 09/23/2019    Arliss Journey, PT, DPT  11/28/2021, 4:37 PM  [facility] [address] [place], [place], [address] Phone: [phone]   Fax:  [phone]  Name: Carlos [name] MRN: [id number] Date of Birth: 1966/03/19

## 2021-11-29 DIAGNOSIS — G8252 Quadriplegia, C1-C4 incomplete: Secondary | ICD-10-CM | POA: Diagnosis not present

## 2021-11-30 DIAGNOSIS — G8252 Quadriplegia, C1-C4 incomplete: Secondary | ICD-10-CM | POA: Diagnosis not present

## 2021-12-01 DIAGNOSIS — G8252 Quadriplegia, C1-C4 incomplete: Secondary | ICD-10-CM | POA: Diagnosis not present

## 2021-12-02 DIAGNOSIS — G8252 Quadriplegia, C1-C4 incomplete: Secondary | ICD-10-CM | POA: Diagnosis not present

## 2021-12-03 DIAGNOSIS — G8252 Quadriplegia, C1-C4 incomplete: Secondary | ICD-10-CM | POA: Diagnosis not present

## 2021-12-04 ENCOUNTER — Ambulatory Visit: Payer: Medicaid Other | Admitting: Physical Therapy

## 2021-12-04 DIAGNOSIS — G8252 Quadriplegia, C1-C4 incomplete: Secondary | ICD-10-CM | POA: Diagnosis not present

## 2021-12-05 DIAGNOSIS — Z419 Encounter for procedure for purposes other than remedying health state, unspecified: Secondary | ICD-10-CM | POA: Diagnosis not present

## 2021-12-05 DIAGNOSIS — G8252 Quadriplegia, C1-C4 incomplete: Secondary | ICD-10-CM | POA: Diagnosis not present

## 2021-12-06 DIAGNOSIS — G8252 Quadriplegia, C1-C4 incomplete: Secondary | ICD-10-CM | POA: Diagnosis not present

## 2021-12-07 DIAGNOSIS — G8252 Quadriplegia, C1-C4 incomplete: Secondary | ICD-10-CM | POA: Diagnosis not present

## 2021-12-08 DIAGNOSIS — G8252 Quadriplegia, C1-C4 incomplete: Secondary | ICD-10-CM | POA: Diagnosis not present

## 2021-12-09 DIAGNOSIS — G8252 Quadriplegia, C1-C4 incomplete: Secondary | ICD-10-CM | POA: Diagnosis not present

## 2021-12-10 DIAGNOSIS — G8252 Quadriplegia, C1-C4 incomplete: Secondary | ICD-10-CM | POA: Diagnosis not present

## 2021-12-11 ENCOUNTER — Other Ambulatory Visit: Payer: Self-pay

## 2021-12-11 ENCOUNTER — Ambulatory Visit: Payer: Medicaid Other | Attending: Neurology | Admitting: Physical Therapy

## 2021-12-11 DIAGNOSIS — R262 Difficulty in walking, not elsewhere classified: Secondary | ICD-10-CM | POA: Insufficient documentation

## 2021-12-11 DIAGNOSIS — R29898 Other symptoms and signs involving the musculoskeletal system: Secondary | ICD-10-CM | POA: Diagnosis not present

## 2021-12-11 DIAGNOSIS — M6281 Muscle weakness (generalized): Secondary | ICD-10-CM | POA: Insufficient documentation

## 2021-12-11 DIAGNOSIS — R293 Abnormal posture: Secondary | ICD-10-CM | POA: Insufficient documentation

## 2021-12-11 DIAGNOSIS — R29818 Other symptoms and signs involving the nervous system: Secondary | ICD-10-CM | POA: Diagnosis not present

## 2021-12-11 DIAGNOSIS — G8252 Quadriplegia, C1-C4 incomplete: Secondary | ICD-10-CM | POA: Diagnosis not present

## 2021-12-12 DIAGNOSIS — G8252 Quadriplegia, C1-C4 incomplete: Secondary | ICD-10-CM | POA: Diagnosis not present

## 2021-12-12 NOTE — Therapy (Signed)
[facility] [address] [place], [place], [address] Phone: [phone]   Fax:  [phone]  Physical Therapy Treatment  Patient Details  Name: Carlos [name] MRN: [id number] Date of Birth: 1966-01-29 Referring Provider (PT): Tommas Olp, MD   Encounter Date: 12/11/2021   PT End of Session - 12/12/21 1210     Visit Number 18    Number of Visits 26    Date for PT Re-Evaluation 01/05/22    Authorization Type [employer]    Authorization Time Period 8 visits approved from through 01/05/22    Authorization - Visit Number 2    Authorization - Number of Visits 8    PT Start Time 1400    PT Stop Time 1445    PT Time Calculation (min) 45 min    Equipment Utilized During Treatment --   Slideboard, Sara Plus   Activity Tolerance Patient tolerated treatment well;Patient limited by pain    Behavior During Therapy [facility] for tasks assessed/performed             Past Medical History:  Diagnosis Date   Crush injury to hand 12/2018   bilateral hands with multipe fractures   Finger osteomyelitis, 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   Subjective Assessment - 12/11/21 1408     Subjective Is going to reschedule botox appointment, might be next week.  CNA has been helping with stretching but has not done transfers yet.  Pt does not wish to work on standing today.    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    Currently in Pain? No/denies            Pt has added small cushion under coccyx, on top of large cushion.  Performed lateral leans in wheelchair to unweight hips and practice active scooting forwards with mod A.  Therapist positioned slideboard and stood in front of patient to facilitate anterior and lateral lean/weight shift and use of head-hips relationship to scoot across board to mat - required mod A to start scooting but pt able to complete with min A using stronger RUE.    Transitioned to supine with mod A.  Performed to R and LLE: passive hip distraction, passive hip flexion with IR/ER, passive piriformis and IT band stretches, passive hip distraction with LE extended while pt performed isometric quad sets into knee extension for hamstring stretch x 8 reps each LE with 6 second hold.    Returned to sitting with mod-max A.  Performed postural control training with active anterior pelvic tilts combined with anterior leans.  Pt performed anterior leans with RUE support on arm rest in front of pt and performed 3 reps activation of LE attempting to come to small squat while PT provided support at pelvis  to shift COG over BOS and prevent anterior LOB.  On last repetition pt able to lift hips 10-15% from elevated mat.  Pt requested to cease anterior leans due to R shoulder pain and neck spasm.    Returned to w/c with slideboard; requires mod A transferring to R due to LUE flexor spasticity - PT continued to stand in front of pt and facilitate head-hips relationship and initiation of scooting through trunk and pelvis.      PT Short Term Goals - 12/12/21 1211       PT SHORT TERM GOAL #1   Title Pt will be able to perform sliding board transfer from w/c <> mat table with min/mod A with CNA in order to demo improved functional  transfers to decr caregiver burden.    Baseline Pt is not performing with CNA at home; min-mod A from PT in session    Time 4    Period Weeks    Status Not Met    Target Date 12/07/21      PT SHORT TERM GOAL #2   Title Pt will be independent with progressive HEP with CNA/ family members for strength, ROM.    Baseline CNA is helping with stretches and exercises    Time 4    Period Weeks    Status Achieved    Target Date 12/07/21               PT Long Term Goals - 11/09/21 1118       PT LONG TERM GOAL #1   Title Pt will perform bed mobility with min A in order to demo decr caregiver burden. ALL LTGS DUE 01/04/22    Baseline did not have time to assess bed mobility on 09/07/21 due to time constraints; mod A on 11/22    Time 8    Period Weeks    Status On-going    Target Date 01/04/22      PT LONG TERM GOAL #2   Title Pt will perform sliding board transfers with min guard/min A consistently  in order to demo improved functional transfers at home.    Baseline pt needing min/mod A on 11/07/21, performance varies with sliding board transfers when fatigued    Time 8    Period Weeks    Status Revised    Target Date 01/04/22      PT LONG TERM GOAL #3   Title Pt will perform sit <> stand in SARA and maintain standing x 2 minutes in order to demo improved functional BLE strength for transfers and improved standing tolerance.    Baseline only able to stand for 10 seconds today before needing to sit due to incr pain    Time 8    Period Weeks    Status On-going    Target Date 01/04/22      PT LONG TERM GOAL #4   Title Pt will initiate aquatic therapy if deemed appropriate/safe.    Baseline will determine if aquatic therapy is safe/appropriate for patient at this time.    Time 8    Period Weeks    Status On-going    Target Date 01/04/22      PT LONG TERM GOAL #5   Title Pt will be able to take 2 steps in [place] with max A of 1-2 therapists in order to demo improved standing  tolerance/and progression towards pre-gait training.    Baseline have been unable to try taking steps in [place]  Time 8    Period Weeks    Status New      Additional Long Term Goals   Additional Long Term Goals Yes      PT LONG TERM GOAL #6   Title Pt will report being able to transfer into power w/c at home with CNA in order to demo improved independence/improved positioning throughout the day.    Baseline pt not using power w/c    Time 8    Period Weeks    Status New                Plan - 12/12/21 1212     Clinical Impression Statement Pt has not begun to perform transfers with CNA at home and continues to be hesitant to perform standing at therapy.  Pt has met 1 STG and is performing exercises with CNA at home but continues to require stretching from PT at beginning of session to prepare for standing or postural control training.  Also, due to lack of practice, pt continues to require min-mod A from PT for slideboard transfers.  Pt did not meet second STG.  PT will begin to discuss patient's specific long term goals and if pt does wish to continue to work on standing, will begin to discuss options for power mobility to maximize functional mobility independence and decrease caregiver burden of care.    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    Examination-Activity Limitations Bathing;Bed Mobility;Caring for Others;Locomotion Level;Squat;Dressing;Reach Overhead;Stand;Toileting;Transfers;Hygiene/Grooming;Lift    Examination-Participation Restrictions Cleaning;Community Activity;Laundry;Occupation;Driving;Meal Prep    Stability/Clinical Decision Making Unstable/Unpredictable    Rehab Potential Good    PT Frequency 1x / week     PT Duration 12 weeks    PT Treatment/Interventions ADLs/Self Care Home Management;Electrical Stimulation;DME Instruction;Gait training;Functional mobility training;Therapeutic activities;Neuromuscular re-education;Balance training;Therapeutic exercise;Patient/family education;Orthotic Fit/Training;Manual techniques;Passive range of motion;Energy conservation;Vestibular    PT Next Visit Plan He is still very hesitant to standing - may need to review with him: what are his goals for mobility and independence?  If he is not going to do transfers with CNA or do standing in therapy we may need to discuss power mobility as an option and just work on transfers and postural control and really encourage him to do transfers with CNA?    PT Home Exercise Plan [id number] plus seated glute squeezes, LAQs, hip ADD squeezes, heel toe raises    Consulted and Agree with Plan of Care Patient;Family member/caregiver    Family Member Consulted pt's mom             Patient will benefit from skilled therapeutic intervention in order to improve the following deficits and impairments:  Decreased activity tolerance, Decreased balance, Decreased coordination, Decreased endurance, Decreased mobility, Decreased range of motion, Difficulty walking, Decreased strength, Hypomobility, Increased muscle spasms, Impaired flexibility, Impaired tone, Impaired sensation, Impaired UE functional use, Postural dysfunction  Visit Diagnosis: Other symptoms and signs involving the nervous system  Other symptoms and signs involving the musculoskeletal system  Abnormal posture  Muscle weakness (generalized)  Difficulty in walking, not elsewhere classified     Problem List Patient Active Problem List   Diagnosis Date Noted   Finger osteomyelitis, left ([place]) 09/23/2019   Leukocytosis 09/23/2019   Rico Junker, PT, DPT 12/12/21    12:20 PM   [facility]  [facility] [address]  [place], [place], [address] Phone: [phone]   Fax:  [phone]  Name: Carlos [name] MRN: [id number] Date of Birth: Jul 01, 1966

## 2021-12-13 DIAGNOSIS — G8252 Quadriplegia, C1-C4 incomplete: Secondary | ICD-10-CM | POA: Diagnosis not present

## 2021-12-14 DIAGNOSIS — G8252 Quadriplegia, C1-C4 incomplete: Secondary | ICD-10-CM | POA: Diagnosis not present

## 2021-12-14 DIAGNOSIS — C720 Malignant neoplasm of spinal cord: Secondary | ICD-10-CM | POA: Diagnosis not present

## 2021-12-14 DIAGNOSIS — R252 Cramp and spasm: Secondary | ICD-10-CM | POA: Diagnosis not present

## 2021-12-14 DIAGNOSIS — M5412 Radiculopathy, cervical region: Secondary | ICD-10-CM | POA: Diagnosis not present

## 2021-12-14 DIAGNOSIS — Z7689 Persons encountering health services in other specified circumstances: Secondary | ICD-10-CM | POA: Diagnosis not present

## 2021-12-15 DIAGNOSIS — G8252 Quadriplegia, C1-C4 incomplete: Secondary | ICD-10-CM | POA: Diagnosis not present

## 2021-12-16 DIAGNOSIS — G8252 Quadriplegia, C1-C4 incomplete: Secondary | ICD-10-CM | POA: Diagnosis not present

## 2021-12-17 DIAGNOSIS — G8252 Quadriplegia, C1-C4 incomplete: Secondary | ICD-10-CM | POA: Diagnosis not present

## 2021-12-18 DIAGNOSIS — G8252 Quadriplegia, C1-C4 incomplete: Secondary | ICD-10-CM | POA: Diagnosis not present

## 2021-12-19 ENCOUNTER — Ambulatory Visit: Payer: Medicaid Other | Admitting: Physical Therapy

## 2021-12-19 DIAGNOSIS — G8252 Quadriplegia, C1-C4 incomplete: Secondary | ICD-10-CM | POA: Diagnosis not present

## 2021-12-20 DIAGNOSIS — G8252 Quadriplegia, C1-C4 incomplete: Secondary | ICD-10-CM | POA: Diagnosis not present

## 2021-12-21 DIAGNOSIS — G8252 Quadriplegia, C1-C4 incomplete: Secondary | ICD-10-CM | POA: Diagnosis not present

## 2021-12-22 DIAGNOSIS — G8252 Quadriplegia, C1-C4 incomplete: Secondary | ICD-10-CM | POA: Diagnosis not present

## 2021-12-23 DIAGNOSIS — G8252 Quadriplegia, C1-C4 incomplete: Secondary | ICD-10-CM | POA: Diagnosis not present

## 2021-12-24 DIAGNOSIS — G8252 Quadriplegia, C1-C4 incomplete: Secondary | ICD-10-CM | POA: Diagnosis not present

## 2021-12-25 DIAGNOSIS — G8252 Quadriplegia, C1-C4 incomplete: Secondary | ICD-10-CM | POA: Diagnosis not present

## 2021-12-26 ENCOUNTER — Encounter: Payer: Self-pay | Admitting: Physical Therapy

## 2021-12-26 ENCOUNTER — Ambulatory Visit: Payer: Medicaid Other | Admitting: Physical Therapy

## 2021-12-26 ENCOUNTER — Other Ambulatory Visit: Payer: Self-pay

## 2021-12-26 DIAGNOSIS — R293 Abnormal posture: Secondary | ICD-10-CM | POA: Diagnosis not present

## 2021-12-26 DIAGNOSIS — M6281 Muscle weakness (generalized): Secondary | ICD-10-CM

## 2021-12-26 DIAGNOSIS — R29898 Other symptoms and signs involving the musculoskeletal system: Secondary | ICD-10-CM | POA: Diagnosis not present

## 2021-12-26 DIAGNOSIS — R262 Difficulty in walking, not elsewhere classified: Secondary | ICD-10-CM | POA: Diagnosis not present

## 2021-12-26 DIAGNOSIS — R29818 Other symptoms and signs involving the nervous system: Secondary | ICD-10-CM

## 2021-12-26 DIAGNOSIS — G8252 Quadriplegia, C1-C4 incomplete: Secondary | ICD-10-CM | POA: Diagnosis not present

## 2021-12-26 NOTE — Therapy (Addendum)
[facility] [address] [place], [place], [address] Phone: [phone]   Fax:  [phone]  Physical Therapy Treatment  Patient Details  Name: Camry Theiss MRN: [id number] Date of Birth: 1966-02-13 Referring Provider (PT): Tommas Olp, MD   Encounter Date: 12/26/2021   PT End of Session - 12/26/21 1406     Visit Number 19    Number of Visits 26    Date for PT Re-Evaluation 01/05/22    Authorization Type [employer]    Authorization Time Period 8 visits approved from through 01/05/22    Authorization - Visit Number 3    Authorization - Number of Visits 8    PT Start Time 8938    PT Stop Time 1446    PT Time Calculation (min) 43 min    Equipment Utilized During Treatment --   Slideboard, Sara Plus   Activity Tolerance Patient tolerated treatment well;Patient limited by pain    Behavior During Therapy [facility] for tasks assessed/performed             Past Medical History:  Diagnosis Date   Crush injury to hand 12/2018   bilateral hands with multipe fractures   Finger osteomyelitis, 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   Subjective Assessment - 12/26/21 1406     Subjective Is going to reschedule botox appointment, might be next week.  CNA has been helping with stretching but has not done transfers yet.  Pt does not wish to work on standing today.    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                          Pt arrives to session with caregiver (CNA) and mom.  Had long discussion at start of session about pt's goals for mobility and independence as pt was hesitant on standing at last session. Pt reports that he wants to get back to standing and walking (from when he was in inpatient rehab) and will make sure he goes in the pool to achieve this. Discussed current limitations with the pool  and that can be something to look at going forwards but PT will have to discuss with aquatic therapists. Pt reports that he will just go to the pool at the [facility] with family/caregiver. Continued to educate patient that this would not be safe. Pt also reports frustration about only standing 1x every week or every few weeks in therapy. Had discussion with pt about a goal at this time to work on being more independent with transfers with slideboard at home to improve functional mobility, weight bearing through legs, and incr his strength. Pt is currently being dependently transferred by his caregiver. Also discussed that this can help pt  transfer into his power w/c (pt has one at home) to be more independent with mobility and have better positioning throughout the day. Pt adamant that being in a power w/c will be a regression and will limit his independence and is not interested in using his power w/c. Pt did agree to work towards goal of transfers with sliding board at home with his caregiver. Discussed that if this is the main goal that we are working towards at this time, then ultimately land therapy may be the best use of his Medicaid visits to achieve this vs. Aquatic.    Pt sitting in wheelchair with 2 cushions (one thicker cushion and another cushion on top filled with air).   Worked on lateral scooting in w/c with mod  A to come out to edge for transfer. Educated/demonstrated to pt's caregiver on placement of sliding board and how to perform transfers/proper mechanics.Therapist in front of patient to help with anterior weight shift and head/hips relationship to scoot across board and to block BLE. Pt initially needing mod A to initiate scooting but able to perform remainder of scooting with min A towards the mat. Pt able to use his RUE to help push from w/c to help assist with scooting.  Therapist helped assist with positioning of BLE throughout. Educated on making sure to raise mat table/bed to for gravity to help assist with transfers.  Had pt's caregiver help place slideboard with verbal/occasional demo cues for technique. Performed transfer from mat to pt's w/c with pt scooting towards his R side, pt needing mod A due to his LUE spasticity and being unable to push with this side.   Worked on transferring back to mat table towards pt's L side and had caregiver try to practice, PT needing to give cues throughout for proper guarding, head/hips relationship, facilitation for weight shift. PT having to help at end of transfer with mod A for safety to help get pt over to mat.   Pt needing intermittent seated rest breaks between transfers due to fatigue. Performed transfer back to w/c at end of session towards pt's R side, pt needing max A to initiate transfer and performed remainder with mod A. Once pt seated in w/c, needed max A for proper positioning in chair.  Discussed with pt and pt's caregiver that will continue to need to practice in session with caregiver before he is safe to do them at home. Needs further education on technique and body mechanics/practice before they are cleared to work on them at home. Both verbalized understanding.             PT Short Term Goals - 12/12/21 1211       PT SHORT TERM GOAL #1   Title Pt will be able to perform sliding board transfer from w/c <> mat table with min/mod A  with CNA in order to demo improved functional transfers to decr caregiver burden.    Baseline Pt is not performing with CNA at home; min-mod A from PT in session    Time 4    Period Weeks    Status Not Met    Target Date 12/07/21      PT SHORT TERM GOAL #2   Title Pt will be independent with progressive HEP with CNA/ family members for strength, ROM.    Baseline CNA is helping with stretches and exercises    Time 4    Period Weeks    Status Achieved    Target Date 12/07/21  PT Long Term Goals - 11/09/21 1118       PT LONG TERM GOAL #1   Title Pt will perform bed mobility with min A in order to demo decr caregiver burden. ALL LTGS DUE 01/04/22    Baseline did not have time to assess bed mobility on 09/07/21 due to time constraints; mod A on 11/22    Time 8    Period Weeks    Status On-going    Target Date 01/04/22      PT LONG TERM GOAL #2   Title Pt will perform sliding board transfers with min guard/min A consistently  in order to demo improved functional transfers at home.    Baseline pt needing min/mod A on 11/07/21, performance varies with sliding board transfers when fatigued    Time 8    Period Weeks    Status Revised    Target Date 01/04/22      PT LONG TERM GOAL #3   Title Pt will perform sit <> stand in SARA and maintain standing x 2 minutes in order to demo improved functional BLE strength for transfers and improved standing tolerance.    Baseline only able to stand for 10 seconds today before needing to sit due to incr pain    Time 8    Period Weeks    Status On-going    Target Date 01/04/22      PT LONG TERM GOAL #4   Title Pt will initiate aquatic therapy if deemed appropriate/safe.    Baseline will determine if aquatic therapy is safe/appropriate for patient at this time.    Time 8    Period Weeks    Status On-going    Target Date 01/04/22      PT LONG TERM GOAL #5   Title Pt will be able to take 2 steps in [place] with max A of 1-2  therapists in order to demo improved standing tolerance/and progression towards pre-gait training.    Baseline have been unable to try taking steps in [place]    Time 8    Period Weeks    Status New      Additional Long Term Goals   Additional Long Term Goals Yes      PT LONG TERM GOAL #6   Title Pt will report being able to transfer into power w/c at home with CNA in order to demo improved independence/improved positioning throughout the day.    Baseline pt not using power w/c    Time 8    Period Weeks    Status New               12/28/21 9937  Plan  Clinical Impression Statement Had long discussion with pt about functional and obtainable goals to work on in PT. Pt agreeable to work on goal of sliding board transfers with caregiver at home in order to improve independence as pt is currently being dependently transfered by his caregiver. Discussed use of power mobility to help incr his independence at home but pt adamant that he does not want to use his power w/c and that it will cause him to regress further. Remainder of session worked on Veterinary surgeon transfers from [employer] <> w/c and beginning to educate pt's caregiver (CNA) on proper technique. Pt needing min A when transferring to pt's L where pt can use his R hand to help push. But did need mod/max A (when more fatigued) for transfer to pt's R. Pt's caregiver attempted  to perform one transfer, but needed cues throughout for proper technique for head/hips relationship and anterior guarding in front of pt during transfer with PT needing to come in to further assist. Will further need to practice sliding board transfers with caregiver before it is safe for pt to perform them at home.  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  Examination-Activity Limitations Bathing;Bed Mobility;Caring for Others;Locomotion Level;Squat;Dressing;Reach Overhead;Stand;Toileting;Transfers;Hygiene/Grooming;Lift  Examination-Participation Restrictions Cleaning;Community Activity;Laundry;Occupation;Driving;Meal Prep  Pt will benefit from skilled therapeutic intervention in order to improve on the following deficits Decreased activity tolerance;Decreased balance;Decreased coordination;Decreased endurance;Decreased mobility;Decreased range of motion;Difficulty walking;Decreased strength;Hypomobility;Increased muscle spasms;Impaired flexibility;Impaired tone;Impaired sensation;Impaired UE functional use;Postural dysfunction  Stability/Clinical Decision Making Unstable/Unpredictable  Rehab Potential Good  PT Frequency 1x / week  PT Duration 12 weeks  PT Treatment/Interventions ADLs/Self Care Home Management;Electrical Stimulation;DME Instruction;Gait training;Functional mobility training;Therapeutic activities;Neuromuscular re-education;Balance training;Therapeutic exercise;Patient/family education;Orthotic Fit/Training;Manual techniques;Passive range of motion;Energy conservation;Vestibular  PT Next Visit Plan Will need to check goals and then request more medicaid visits. Pt is agreeable to work on sliding board transfers - began to practice during this session, but will continue to need more practice before pt is safe to do these at home. Pt is adamant that he does not want to use a power w/c (already has one at home). Pt also really wants to try to get in the pool - continued to discuss this with pt and i think suzanne and Juliann Pulse may be looking into getting him in the pool to just try to see how it goes maybe sometimes in March.  PT Home Exercise Plan [id number] plus seated glute squeezes, LAQs, hip ADD squeezes, heel toe raises  Consulted and Agree with Plan of Care Patient;Family member/caregiver   Family Member Consulted pt's mom and CNA         Patient will benefit from skilled therapeutic intervention in order to improve the following deficits and impairments:     Visit Diagnosis: Other symptoms and signs involving the nervous system  Other symptoms and signs involving the musculoskeletal system  Muscle weakness (generalized)  Abnormal posture     Problem List Patient Active Problem List   Diagnosis Date Noted   Finger osteomyelitis, left ([place]) 09/23/2019   Leukocytosis 09/23/2019    Arliss Journey, PT, DPT 12/26/2021, 2:58 PM  [facility] [address] [place], [place], [address] Phone: [phone]   Fax:  [phone]  Name: Kenyon Eichelberger MRN: [id number] Date of Birth: [birth year]

## 2021-12-27 DIAGNOSIS — C720 Malignant neoplasm of spinal cord: Secondary | ICD-10-CM | POA: Diagnosis not present

## 2021-12-27 DIAGNOSIS — G959 Disease of spinal cord, unspecified: Secondary | ICD-10-CM | POA: Diagnosis not present

## 2021-12-27 DIAGNOSIS — G8252 Quadriplegia, C1-C4 incomplete: Secondary | ICD-10-CM | POA: Diagnosis not present

## 2021-12-27 DIAGNOSIS — R059 Cough, unspecified: Secondary | ICD-10-CM | POA: Diagnosis not present

## 2021-12-27 DIAGNOSIS — R509 Fever, unspecified: Secondary | ICD-10-CM | POA: Diagnosis not present

## 2021-12-28 DIAGNOSIS — G8252 Quadriplegia, C1-C4 incomplete: Secondary | ICD-10-CM | POA: Diagnosis not present

## 2021-12-29 DIAGNOSIS — G8252 Quadriplegia, C1-C4 incomplete: Secondary | ICD-10-CM | POA: Diagnosis not present

## 2021-12-30 DIAGNOSIS — G8252 Quadriplegia, C1-C4 incomplete: Secondary | ICD-10-CM | POA: Diagnosis not present

## 2021-12-31 ENCOUNTER — Telehealth: Payer: Self-pay

## 2021-12-31 DIAGNOSIS — G8252 Quadriplegia, C1-C4 incomplete: Secondary | ICD-10-CM | POA: Diagnosis not present

## 2021-12-31 NOTE — Telephone Encounter (Signed)
.. °  Medicaid Managed Care   Unsuccessful Outreach Note  12/31/2021 Name: Carlos [name] MRN: [id number] DOB: 12-12-1965  Referred by: Patient, No Pcp Per (Inactive) Reason for referral : High Risk Managed Medicaid (Called the patient today to get him rescheduled with the MM RNCM. His VM was full.)   An unsuccessful telephone outreach was attempted today. The patient was referred to the case management team for assistance with care management and care coordination.   Follow Up Plan: The care management team will reach out to the patient again over the next 14 days.    [place]

## 2022-01-01 ENCOUNTER — Ambulatory Visit: Payer: Medicaid Other | Admitting: Physical Therapy

## 2022-01-01 DIAGNOSIS — G8252 Quadriplegia, C1-C4 incomplete: Secondary | ICD-10-CM | POA: Diagnosis not present

## 2022-01-02 DIAGNOSIS — R350 Frequency of micturition: Secondary | ICD-10-CM | POA: Diagnosis not present

## 2022-01-02 DIAGNOSIS — G8252 Quadriplegia, C1-C4 incomplete: Secondary | ICD-10-CM | POA: Diagnosis not present

## 2022-01-02 DIAGNOSIS — Z419 Encounter for procedure for purposes other than remedying health state, unspecified: Secondary | ICD-10-CM | POA: Diagnosis not present

## 2022-01-02 DIAGNOSIS — R509 Fever, unspecified: Secondary | ICD-10-CM | POA: Diagnosis not present

## 2022-01-03 DIAGNOSIS — G8252 Quadriplegia, C1-C4 incomplete: Secondary | ICD-10-CM | POA: Diagnosis not present

## 2022-01-04 ENCOUNTER — Telehealth: Payer: Self-pay | Admitting: Physical Therapy

## 2022-01-04 ENCOUNTER — Ambulatory Visit: Payer: Medicaid Other | Admitting: Physical Therapy

## 2022-01-04 DIAGNOSIS — R509 Fever, unspecified: Secondary | ICD-10-CM | POA: Diagnosis not present

## 2022-01-04 DIAGNOSIS — N390 Urinary tract infection, site not specified: Secondary | ICD-10-CM | POA: Diagnosis not present

## 2022-01-04 DIAGNOSIS — G8252 Quadriplegia, C1-C4 incomplete: Secondary | ICD-10-CM | POA: Diagnosis not present

## 2022-01-04 DIAGNOSIS — R300 Dysuria: Secondary | ICD-10-CM | POA: Diagnosis not present

## 2022-01-04 DIAGNOSIS — R350 Frequency of micturition: Secondary | ICD-10-CM | POA: Diagnosis not present

## 2022-01-04 NOTE — Telephone Encounter (Signed)
PT contacted patient regarding missed PT visit today.   ? ?Pt reports he called clinic yesterday and cancelled appointment because he is still having fevers and is on his way back to urgent care to have further work up.   ? ?Pt medically not able to attend therapy today.  Alerted pt that today was his last scheduled appointment.  Pt states he will have his mother call clinic and set up appointment with PT for re-assessment and recertification.  ? ?Carlos [name], PT, DPT ?01/04/22    1:38 PM ? ? ?

## 2022-01-05 DIAGNOSIS — G8252 Quadriplegia, C1-C4 incomplete: Secondary | ICD-10-CM | POA: Diagnosis not present

## 2022-01-06 DIAGNOSIS — G8252 Quadriplegia, C1-C4 incomplete: Secondary | ICD-10-CM | POA: Diagnosis not present

## 2022-01-07 DIAGNOSIS — G8252 Quadriplegia, C1-C4 incomplete: Secondary | ICD-10-CM | POA: Diagnosis not present

## 2022-01-08 DIAGNOSIS — G8252 Quadriplegia, C1-C4 incomplete: Secondary | ICD-10-CM | POA: Diagnosis not present

## 2022-01-09 DIAGNOSIS — G8252 Quadriplegia, C1-C4 incomplete: Secondary | ICD-10-CM | POA: Diagnosis not present

## 2022-01-10 DIAGNOSIS — G8252 Quadriplegia, C1-C4 incomplete: Secondary | ICD-10-CM | POA: Diagnosis not present

## 2022-01-11 DIAGNOSIS — G8252 Quadriplegia, C1-C4 incomplete: Secondary | ICD-10-CM | POA: Diagnosis not present

## 2022-01-12 DIAGNOSIS — G8252 Quadriplegia, C1-C4 incomplete: Secondary | ICD-10-CM | POA: Diagnosis not present

## 2022-01-13 DIAGNOSIS — G8252 Quadriplegia, C1-C4 incomplete: Secondary | ICD-10-CM | POA: Diagnosis not present

## 2022-01-14 DIAGNOSIS — G8252 Quadriplegia, C1-C4 incomplete: Secondary | ICD-10-CM | POA: Diagnosis not present

## 2022-01-15 DIAGNOSIS — G8252 Quadriplegia, C1-C4 incomplete: Secondary | ICD-10-CM | POA: Diagnosis not present

## 2022-01-16 DIAGNOSIS — G8252 Quadriplegia, C1-C4 incomplete: Secondary | ICD-10-CM | POA: Diagnosis not present

## 2022-01-17 ENCOUNTER — Other Ambulatory Visit: Payer: Self-pay

## 2022-01-17 DIAGNOSIS — G8252 Quadriplegia, C1-C4 incomplete: Secondary | ICD-10-CM | POA: Diagnosis not present

## 2022-01-17 NOTE — Patient Outreach (Signed)
Care Coordination ? ?01/17/2022 ? ?Carlos [name] ?1966-03-30 ?[id number] ? ? ?This RN Care Manager had a successful telephone contact with the patient.  After reviewing the patient record and speaking with the patient, it was determined that the patient chose not to enroll in the [place] Medicaid program at this time. ? ?This Consulting civil engineer plans on emailing the patient the contact information for [place] Medicaid program if [employer] can be of any assistance in the future. ? ?Salvatore Marvel RN, BSN ?Community Care Coordinator ?[facility] ?Mobile: [phone]  ?

## 2022-01-17 NOTE — Patient Instructions (Signed)
Visit Information ? ?[name] was given information about Medicaid Managed Care team care coordination services as a part of their [facility] Medicaid benefit. [name] did not consent to engagement with the [facility] Managed Care team.  ? ?If you are experiencing a medical emergency, please call 911 or report to your local emergency department or urgent care.  ? ?If you have a non-emergency medical problem during routine business hours, please contact your provider's office and ask to speak with a nurse.  ? ?For questions related to your [facility] health plan, please call: [phone] or go here:[email or web address] ? ?If you would like to schedule transportation through your [facility] plan, please call the following number at least 2 days in advance of your appointment: [phone]. ? You can also use the MTM portal or MTM mobile app to manage your rides. For the portal, please go to [email or web address]. ? ?Call the [place] at [phone], at any time, 24 hours a day, 7 days a week. If you are in danger or need immediate medical attention call 911. ? ?If you would like help to quit smoking, call 1-800-QUIT-NOW 850-301-8398) OR Espa?ol: 1-855-D?jelo-Ya 518-723-1611) o para m?s informaci?n haga clic aqu? or Text READY to 200-400 to register via text ? ? ?The patient did not enroll with the [place] Medicaid program. ? ?Salvatore Marvel RN, BSN ?Community Care Coordinator ?[place] [facility] ?Mobile: [phone]  ? ?Following is a copy of your plan of care:  ?There are no care plans that you recently modified to display for this patient. ?  ?

## 2022-01-18 DIAGNOSIS — G8252 Quadriplegia, C1-C4 incomplete: Secondary | ICD-10-CM | POA: Diagnosis not present

## 2022-01-19 DIAGNOSIS — G8252 Quadriplegia, C1-C4 incomplete: Secondary | ICD-10-CM | POA: Diagnosis not present

## 2022-01-20 DIAGNOSIS — G8252 Quadriplegia, C1-C4 incomplete: Secondary | ICD-10-CM | POA: Diagnosis not present

## 2022-01-21 DIAGNOSIS — G8252 Quadriplegia, C1-C4 incomplete: Secondary | ICD-10-CM | POA: Diagnosis not present

## 2022-01-22 DIAGNOSIS — F5232 Male orgasmic disorder: Secondary | ICD-10-CM | POA: Diagnosis not present

## 2022-01-22 DIAGNOSIS — Z7689 Persons encountering health services in other specified circumstances: Secondary | ICD-10-CM | POA: Diagnosis not present

## 2022-01-22 DIAGNOSIS — G8252 Quadriplegia, C1-C4 incomplete: Secondary | ICD-10-CM | POA: Diagnosis not present

## 2022-01-23 DIAGNOSIS — G8252 Quadriplegia, C1-C4 incomplete: Secondary | ICD-10-CM | POA: Diagnosis not present

## 2022-01-24 DIAGNOSIS — G8252 Quadriplegia, C1-C4 incomplete: Secondary | ICD-10-CM | POA: Diagnosis not present

## 2022-01-25 DIAGNOSIS — G8252 Quadriplegia, C1-C4 incomplete: Secondary | ICD-10-CM | POA: Diagnosis not present

## 2022-01-26 DIAGNOSIS — G8252 Quadriplegia, C1-C4 incomplete: Secondary | ICD-10-CM | POA: Diagnosis not present

## 2022-01-27 DIAGNOSIS — G8252 Quadriplegia, C1-C4 incomplete: Secondary | ICD-10-CM | POA: Diagnosis not present

## 2022-01-28 DIAGNOSIS — G8252 Quadriplegia, C1-C4 incomplete: Secondary | ICD-10-CM | POA: Diagnosis not present

## 2022-01-29 DIAGNOSIS — G8252 Quadriplegia, C1-C4 incomplete: Secondary | ICD-10-CM | POA: Diagnosis not present

## 2022-01-30 DIAGNOSIS — G8252 Quadriplegia, C1-C4 incomplete: Secondary | ICD-10-CM | POA: Diagnosis not present

## 2022-01-30 DIAGNOSIS — R252 Cramp and spasm: Secondary | ICD-10-CM | POA: Diagnosis not present

## 2022-01-31 DIAGNOSIS — G8252 Quadriplegia, C1-C4 incomplete: Secondary | ICD-10-CM | POA: Diagnosis not present

## 2022-02-01 DIAGNOSIS — G8252 Quadriplegia, C1-C4 incomplete: Secondary | ICD-10-CM | POA: Diagnosis not present

## 2022-02-02 DIAGNOSIS — G8252 Quadriplegia, C1-C4 incomplete: Secondary | ICD-10-CM | POA: Diagnosis not present

## 2022-02-02 DIAGNOSIS — Z419 Encounter for procedure for purposes other than remedying health state, unspecified: Secondary | ICD-10-CM | POA: Diagnosis not present

## 2022-02-03 DIAGNOSIS — G8252 Quadriplegia, C1-C4 incomplete: Secondary | ICD-10-CM | POA: Diagnosis not present

## 2022-02-04 DIAGNOSIS — G8252 Quadriplegia, C1-C4 incomplete: Secondary | ICD-10-CM | POA: Diagnosis not present

## 2022-02-05 DIAGNOSIS — G8252 Quadriplegia, C1-C4 incomplete: Secondary | ICD-10-CM | POA: Diagnosis not present

## 2022-02-06 DIAGNOSIS — G8252 Quadriplegia, C1-C4 incomplete: Secondary | ICD-10-CM | POA: Diagnosis not present

## 2022-02-07 DIAGNOSIS — G8252 Quadriplegia, C1-C4 incomplete: Secondary | ICD-10-CM | POA: Diagnosis not present

## 2022-02-08 DIAGNOSIS — G8252 Quadriplegia, C1-C4 incomplete: Secondary | ICD-10-CM | POA: Diagnosis not present

## 2022-02-09 DIAGNOSIS — G8252 Quadriplegia, C1-C4 incomplete: Secondary | ICD-10-CM | POA: Diagnosis not present

## 2022-02-10 DIAGNOSIS — G8252 Quadriplegia, C1-C4 incomplete: Secondary | ICD-10-CM | POA: Diagnosis not present

## 2022-02-11 DIAGNOSIS — G8252 Quadriplegia, C1-C4 incomplete: Secondary | ICD-10-CM | POA: Diagnosis not present

## 2022-02-12 DIAGNOSIS — G8252 Quadriplegia, C1-C4 incomplete: Secondary | ICD-10-CM | POA: Diagnosis not present

## 2022-02-13 DIAGNOSIS — G8252 Quadriplegia, C1-C4 incomplete: Secondary | ICD-10-CM | POA: Diagnosis not present

## 2022-02-14 DIAGNOSIS — R198 Other specified symptoms and signs involving the digestive system and abdomen: Secondary | ICD-10-CM | POA: Diagnosis not present

## 2022-02-14 DIAGNOSIS — R1084 Generalized abdominal pain: Secondary | ICD-10-CM | POA: Diagnosis not present

## 2022-02-14 DIAGNOSIS — G8252 Quadriplegia, C1-C4 incomplete: Secondary | ICD-10-CM | POA: Diagnosis not present

## 2022-02-14 DIAGNOSIS — R6881 Early satiety: Secondary | ICD-10-CM | POA: Diagnosis not present

## 2022-02-15 DIAGNOSIS — G8252 Quadriplegia, C1-C4 incomplete: Secondary | ICD-10-CM | POA: Diagnosis not present

## 2022-02-16 DIAGNOSIS — G8252 Quadriplegia, C1-C4 incomplete: Secondary | ICD-10-CM | POA: Diagnosis not present

## 2022-02-17 DIAGNOSIS — G8252 Quadriplegia, C1-C4 incomplete: Secondary | ICD-10-CM | POA: Diagnosis not present

## 2022-02-18 DIAGNOSIS — G8252 Quadriplegia, C1-C4 incomplete: Secondary | ICD-10-CM | POA: Diagnosis not present

## 2022-02-19 DIAGNOSIS — G8252 Quadriplegia, C1-C4 incomplete: Secondary | ICD-10-CM | POA: Diagnosis not present

## 2022-02-20 DIAGNOSIS — M47812 Spondylosis without myelopathy or radiculopathy, cervical region: Secondary | ICD-10-CM | POA: Diagnosis not present

## 2022-02-20 DIAGNOSIS — G8252 Quadriplegia, C1-C4 incomplete: Secondary | ICD-10-CM | POA: Diagnosis not present

## 2022-02-20 DIAGNOSIS — Z9889 Other specified postprocedural states: Secondary | ICD-10-CM | POA: Diagnosis not present

## 2022-02-20 DIAGNOSIS — M4802 Spinal stenosis, cervical region: Secondary | ICD-10-CM | POA: Diagnosis not present

## 2022-02-20 DIAGNOSIS — D497 Neoplasm of unspecified behavior of endocrine glands and other parts of nervous system: Secondary | ICD-10-CM | POA: Diagnosis not present

## 2022-02-21 DIAGNOSIS — G8252 Quadriplegia, C1-C4 incomplete: Secondary | ICD-10-CM | POA: Diagnosis not present

## 2022-02-22 DIAGNOSIS — G8252 Quadriplegia, C1-C4 incomplete: Secondary | ICD-10-CM | POA: Diagnosis not present

## 2022-02-23 DIAGNOSIS — G8252 Quadriplegia, C1-C4 incomplete: Secondary | ICD-10-CM | POA: Diagnosis not present

## 2022-02-24 DIAGNOSIS — G8252 Quadriplegia, C1-C4 incomplete: Secondary | ICD-10-CM | POA: Diagnosis not present

## 2022-02-25 DIAGNOSIS — G8252 Quadriplegia, C1-C4 incomplete: Secondary | ICD-10-CM | POA: Diagnosis not present

## 2022-02-26 DIAGNOSIS — G8252 Quadriplegia, C1-C4 incomplete: Secondary | ICD-10-CM | POA: Diagnosis not present

## 2022-02-27 DIAGNOSIS — G8252 Quadriplegia, C1-C4 incomplete: Secondary | ICD-10-CM | POA: Diagnosis not present

## 2022-02-28 DIAGNOSIS — G8252 Quadriplegia, C1-C4 incomplete: Secondary | ICD-10-CM | POA: Diagnosis not present

## 2022-03-01 DIAGNOSIS — Z5309 Procedure and treatment not carried out because of other contraindication: Secondary | ICD-10-CM | POA: Diagnosis not present

## 2022-03-01 DIAGNOSIS — Z1211 Encounter for screening for malignant neoplasm of colon: Secondary | ICD-10-CM | POA: Diagnosis not present

## 2022-03-01 DIAGNOSIS — Z538 Procedure and treatment not carried out for other reasons: Secondary | ICD-10-CM | POA: Diagnosis not present

## 2022-03-01 DIAGNOSIS — Z882 Allergy status to sulfonamides status: Secondary | ICD-10-CM | POA: Diagnosis not present

## 2022-03-01 DIAGNOSIS — Z7689 Persons encountering health services in other specified circumstances: Secondary | ICD-10-CM | POA: Diagnosis not present

## 2022-03-01 DIAGNOSIS — G8252 Quadriplegia, C1-C4 incomplete: Secondary | ICD-10-CM | POA: Diagnosis not present

## 2022-03-01 DIAGNOSIS — K5989 Other specified functional intestinal disorders: Secondary | ICD-10-CM | POA: Diagnosis not present

## 2022-03-02 DIAGNOSIS — G8252 Quadriplegia, C1-C4 incomplete: Secondary | ICD-10-CM | POA: Diagnosis not present

## 2022-03-03 DIAGNOSIS — G8252 Quadriplegia, C1-C4 incomplete: Secondary | ICD-10-CM | POA: Diagnosis not present

## 2022-03-04 DIAGNOSIS — G8252 Quadriplegia, C1-C4 incomplete: Secondary | ICD-10-CM | POA: Diagnosis not present

## 2022-03-04 DIAGNOSIS — Z419 Encounter for procedure for purposes other than remedying health state, unspecified: Secondary | ICD-10-CM | POA: Diagnosis not present

## 2022-03-05 DIAGNOSIS — G8252 Quadriplegia, C1-C4 incomplete: Secondary | ICD-10-CM | POA: Diagnosis not present

## 2022-03-06 DIAGNOSIS — G8252 Quadriplegia, C1-C4 incomplete: Secondary | ICD-10-CM | POA: Diagnosis not present

## 2022-03-07 DIAGNOSIS — G8252 Quadriplegia, C1-C4 incomplete: Secondary | ICD-10-CM | POA: Diagnosis not present

## 2022-03-08 DIAGNOSIS — G8252 Quadriplegia, C1-C4 incomplete: Secondary | ICD-10-CM | POA: Diagnosis not present

## 2022-03-09 DIAGNOSIS — G8252 Quadriplegia, C1-C4 incomplete: Secondary | ICD-10-CM | POA: Diagnosis not present

## 2022-03-10 DIAGNOSIS — G8252 Quadriplegia, C1-C4 incomplete: Secondary | ICD-10-CM | POA: Diagnosis not present

## 2022-03-11 DIAGNOSIS — G8252 Quadriplegia, C1-C4 incomplete: Secondary | ICD-10-CM | POA: Diagnosis not present

## 2022-03-12 DIAGNOSIS — G8252 Quadriplegia, C1-C4 incomplete: Secondary | ICD-10-CM | POA: Diagnosis not present

## 2022-03-13 DIAGNOSIS — G8252 Quadriplegia, C1-C4 incomplete: Secondary | ICD-10-CM | POA: Diagnosis not present

## 2022-03-14 DIAGNOSIS — G8252 Quadriplegia, C1-C4 incomplete: Secondary | ICD-10-CM | POA: Diagnosis not present

## 2022-03-15 DIAGNOSIS — G8252 Quadriplegia, C1-C4 incomplete: Secondary | ICD-10-CM | POA: Diagnosis not present

## 2022-03-16 DIAGNOSIS — G8252 Quadriplegia, C1-C4 incomplete: Secondary | ICD-10-CM | POA: Diagnosis not present

## 2022-03-17 DIAGNOSIS — G8252 Quadriplegia, C1-C4 incomplete: Secondary | ICD-10-CM | POA: Diagnosis not present

## 2022-03-18 DIAGNOSIS — G8252 Quadriplegia, C1-C4 incomplete: Secondary | ICD-10-CM | POA: Diagnosis not present

## 2022-03-19 DIAGNOSIS — G8252 Quadriplegia, C1-C4 incomplete: Secondary | ICD-10-CM | POA: Diagnosis not present

## 2022-03-20 DIAGNOSIS — G8252 Quadriplegia, C1-C4 incomplete: Secondary | ICD-10-CM | POA: Diagnosis not present

## 2022-03-21 DIAGNOSIS — G8252 Quadriplegia, C1-C4 incomplete: Secondary | ICD-10-CM | POA: Diagnosis not present

## 2022-03-22 DIAGNOSIS — G8252 Quadriplegia, C1-C4 incomplete: Secondary | ICD-10-CM | POA: Diagnosis not present

## 2022-03-22 DIAGNOSIS — R252 Cramp and spasm: Secondary | ICD-10-CM | POA: Diagnosis not present

## 2022-03-23 DIAGNOSIS — G8252 Quadriplegia, C1-C4 incomplete: Secondary | ICD-10-CM | POA: Diagnosis not present

## 2022-03-24 DIAGNOSIS — G8252 Quadriplegia, C1-C4 incomplete: Secondary | ICD-10-CM | POA: Diagnosis not present

## 2022-03-25 DIAGNOSIS — G8252 Quadriplegia, C1-C4 incomplete: Secondary | ICD-10-CM | POA: Diagnosis not present

## 2022-03-26 DIAGNOSIS — G8252 Quadriplegia, C1-C4 incomplete: Secondary | ICD-10-CM | POA: Diagnosis not present

## 2022-03-27 DIAGNOSIS — G8252 Quadriplegia, C1-C4 incomplete: Secondary | ICD-10-CM | POA: Diagnosis not present

## 2022-03-28 DIAGNOSIS — G8252 Quadriplegia, C1-C4 incomplete: Secondary | ICD-10-CM | POA: Diagnosis not present

## 2022-03-29 DIAGNOSIS — G8252 Quadriplegia, C1-C4 incomplete: Secondary | ICD-10-CM | POA: Diagnosis not present

## 2022-03-30 DIAGNOSIS — G8252 Quadriplegia, C1-C4 incomplete: Secondary | ICD-10-CM | POA: Diagnosis not present

## 2022-04-01 DIAGNOSIS — G8252 Quadriplegia, C1-C4 incomplete: Secondary | ICD-10-CM | POA: Diagnosis not present

## 2022-04-02 DIAGNOSIS — G8252 Quadriplegia, C1-C4 incomplete: Secondary | ICD-10-CM | POA: Diagnosis not present

## 2022-04-03 DIAGNOSIS — G8252 Quadriplegia, C1-C4 incomplete: Secondary | ICD-10-CM | POA: Diagnosis not present

## 2022-04-04 DIAGNOSIS — Z419 Encounter for procedure for purposes other than remedying health state, unspecified: Secondary | ICD-10-CM | POA: Diagnosis not present

## 2022-04-04 DIAGNOSIS — G8252 Quadriplegia, C1-C4 incomplete: Secondary | ICD-10-CM | POA: Diagnosis not present

## 2022-04-05 DIAGNOSIS — G8252 Quadriplegia, C1-C4 incomplete: Secondary | ICD-10-CM | POA: Diagnosis not present

## 2022-04-06 DIAGNOSIS — G8252 Quadriplegia, C1-C4 incomplete: Secondary | ICD-10-CM | POA: Diagnosis not present

## 2022-04-07 DIAGNOSIS — G8252 Quadriplegia, C1-C4 incomplete: Secondary | ICD-10-CM | POA: Diagnosis not present

## 2022-04-08 DIAGNOSIS — N529 Male erectile dysfunction, unspecified: Secondary | ICD-10-CM | POA: Diagnosis not present

## 2022-04-08 DIAGNOSIS — R030 Elevated blood-pressure reading, without diagnosis of hypertension: Secondary | ICD-10-CM | POA: Diagnosis not present

## 2022-04-08 DIAGNOSIS — G2581 Restless legs syndrome: Secondary | ICD-10-CM | POA: Diagnosis not present

## 2022-04-08 DIAGNOSIS — Z823 Family history of stroke: Secondary | ICD-10-CM | POA: Diagnosis not present

## 2022-04-08 DIAGNOSIS — Z881 Allergy status to other antibiotic agents status: Secondary | ICD-10-CM | POA: Diagnosis not present

## 2022-04-08 DIAGNOSIS — Z882 Allergy status to sulfonamides status: Secondary | ICD-10-CM | POA: Diagnosis not present

## 2022-04-08 DIAGNOSIS — Z8249 Family history of ischemic heart disease and other diseases of the circulatory system: Secondary | ICD-10-CM | POA: Diagnosis not present

## 2022-04-08 DIAGNOSIS — G8252 Quadriplegia, C1-C4 incomplete: Secondary | ICD-10-CM | POA: Diagnosis not present

## 2022-04-08 DIAGNOSIS — Z993 Dependence on wheelchair: Secondary | ICD-10-CM | POA: Diagnosis not present

## 2022-04-08 DIAGNOSIS — G629 Polyneuropathy, unspecified: Secondary | ICD-10-CM | POA: Diagnosis not present

## 2022-04-09 DIAGNOSIS — G8252 Quadriplegia, C1-C4 incomplete: Secondary | ICD-10-CM | POA: Diagnosis not present

## 2022-04-10 DIAGNOSIS — G8252 Quadriplegia, C1-C4 incomplete: Secondary | ICD-10-CM | POA: Diagnosis not present

## 2022-04-11 DIAGNOSIS — G8252 Quadriplegia, C1-C4 incomplete: Secondary | ICD-10-CM | POA: Diagnosis not present

## 2022-04-12 DIAGNOSIS — G8252 Quadriplegia, C1-C4 incomplete: Secondary | ICD-10-CM | POA: Diagnosis not present

## 2022-04-13 DIAGNOSIS — G8252 Quadriplegia, C1-C4 incomplete: Secondary | ICD-10-CM | POA: Diagnosis not present

## 2022-04-14 DIAGNOSIS — G8252 Quadriplegia, C1-C4 incomplete: Secondary | ICD-10-CM | POA: Diagnosis not present

## 2022-04-15 DIAGNOSIS — G8252 Quadriplegia, C1-C4 incomplete: Secondary | ICD-10-CM | POA: Diagnosis not present

## 2022-04-16 DIAGNOSIS — G8252 Quadriplegia, C1-C4 incomplete: Secondary | ICD-10-CM | POA: Diagnosis not present

## 2022-04-17 DIAGNOSIS — G959 Disease of spinal cord, unspecified: Secondary | ICD-10-CM | POA: Diagnosis not present

## 2022-04-17 DIAGNOSIS — Z125 Encounter for screening for malignant neoplasm of prostate: Secondary | ICD-10-CM | POA: Diagnosis not present

## 2022-04-17 DIAGNOSIS — Z9889 Other specified postprocedural states: Secondary | ICD-10-CM | POA: Diagnosis not present

## 2022-04-17 DIAGNOSIS — Z130 Encounter for screening for diseases of the blood and blood-forming organs and certain disorders involving the immune mechanism: Secondary | ICD-10-CM | POA: Diagnosis not present

## 2022-04-17 DIAGNOSIS — Z7689 Persons encountering health services in other specified circumstances: Secondary | ICD-10-CM | POA: Diagnosis not present

## 2022-04-17 DIAGNOSIS — C720 Malignant neoplasm of spinal cord: Secondary | ICD-10-CM | POA: Diagnosis not present

## 2022-04-17 DIAGNOSIS — N5239 Other post-surgical erectile dysfunction: Secondary | ICD-10-CM | POA: Diagnosis not present

## 2022-04-17 DIAGNOSIS — Z13220 Encounter for screening for lipoid disorders: Secondary | ICD-10-CM | POA: Diagnosis not present

## 2022-04-17 DIAGNOSIS — E538 Deficiency of other specified B group vitamins: Secondary | ICD-10-CM | POA: Diagnosis not present

## 2022-04-17 DIAGNOSIS — Z0000 Encounter for general adult medical examination without abnormal findings: Secondary | ICD-10-CM | POA: Diagnosis not present

## 2022-04-17 DIAGNOSIS — G8252 Quadriplegia, C1-C4 incomplete: Secondary | ICD-10-CM | POA: Diagnosis not present

## 2022-04-18 DIAGNOSIS — G8252 Quadriplegia, C1-C4 incomplete: Secondary | ICD-10-CM | POA: Diagnosis not present

## 2022-04-19 DIAGNOSIS — G8252 Quadriplegia, C1-C4 incomplete: Secondary | ICD-10-CM | POA: Diagnosis not present

## 2022-04-20 DIAGNOSIS — G8252 Quadriplegia, C1-C4 incomplete: Secondary | ICD-10-CM | POA: Diagnosis not present

## 2022-04-21 DIAGNOSIS — G8252 Quadriplegia, C1-C4 incomplete: Secondary | ICD-10-CM | POA: Diagnosis not present

## 2022-04-22 DIAGNOSIS — G8252 Quadriplegia, C1-C4 incomplete: Secondary | ICD-10-CM | POA: Diagnosis not present

## 2022-04-23 DIAGNOSIS — G8252 Quadriplegia, C1-C4 incomplete: Secondary | ICD-10-CM | POA: Diagnosis not present

## 2022-04-24 DIAGNOSIS — Z7689 Persons encountering health services in other specified circumstances: Secondary | ICD-10-CM | POA: Diagnosis not present

## 2022-04-24 DIAGNOSIS — C720 Malignant neoplasm of spinal cord: Secondary | ICD-10-CM | POA: Diagnosis not present

## 2022-04-24 DIAGNOSIS — N5319 Other ejaculatory dysfunction: Secondary | ICD-10-CM | POA: Diagnosis not present

## 2022-04-24 DIAGNOSIS — G8252 Quadriplegia, C1-C4 incomplete: Secondary | ICD-10-CM | POA: Diagnosis not present

## 2022-04-24 DIAGNOSIS — G959 Disease of spinal cord, unspecified: Secondary | ICD-10-CM | POA: Diagnosis not present

## 2022-04-25 DIAGNOSIS — G8252 Quadriplegia, C1-C4 incomplete: Secondary | ICD-10-CM | POA: Diagnosis not present

## 2022-04-26 DIAGNOSIS — G8252 Quadriplegia, C1-C4 incomplete: Secondary | ICD-10-CM | POA: Diagnosis not present

## 2022-04-27 DIAGNOSIS — G8252 Quadriplegia, C1-C4 incomplete: Secondary | ICD-10-CM | POA: Diagnosis not present

## 2022-04-28 DIAGNOSIS — G8252 Quadriplegia, C1-C4 incomplete: Secondary | ICD-10-CM | POA: Diagnosis not present

## 2022-04-29 DIAGNOSIS — G8252 Quadriplegia, C1-C4 incomplete: Secondary | ICD-10-CM | POA: Diagnosis not present

## 2022-04-30 DIAGNOSIS — G8252 Quadriplegia, C1-C4 incomplete: Secondary | ICD-10-CM | POA: Diagnosis not present

## 2022-05-01 DIAGNOSIS — G8252 Quadriplegia, C1-C4 incomplete: Secondary | ICD-10-CM | POA: Diagnosis not present

## 2022-05-02 DIAGNOSIS — G8252 Quadriplegia, C1-C4 incomplete: Secondary | ICD-10-CM | POA: Diagnosis not present

## 2022-05-03 DIAGNOSIS — R252 Cramp and spasm: Secondary | ICD-10-CM | POA: Diagnosis not present

## 2022-05-03 DIAGNOSIS — Z7689 Persons encountering health services in other specified circumstances: Secondary | ICD-10-CM | POA: Diagnosis not present

## 2022-05-03 DIAGNOSIS — G8252 Quadriplegia, C1-C4 incomplete: Secondary | ICD-10-CM | POA: Diagnosis not present

## 2022-05-04 DIAGNOSIS — G8252 Quadriplegia, C1-C4 incomplete: Secondary | ICD-10-CM | POA: Diagnosis not present

## 2022-05-04 DIAGNOSIS — Z419 Encounter for procedure for purposes other than remedying health state, unspecified: Secondary | ICD-10-CM | POA: Diagnosis not present

## 2022-05-05 DIAGNOSIS — G8252 Quadriplegia, C1-C4 incomplete: Secondary | ICD-10-CM | POA: Diagnosis not present

## 2022-05-06 DIAGNOSIS — G8252 Quadriplegia, C1-C4 incomplete: Secondary | ICD-10-CM | POA: Diagnosis not present

## 2022-05-07 DIAGNOSIS — G8252 Quadriplegia, C1-C4 incomplete: Secondary | ICD-10-CM | POA: Diagnosis not present

## 2022-05-08 DIAGNOSIS — G8252 Quadriplegia, C1-C4 incomplete: Secondary | ICD-10-CM | POA: Diagnosis not present

## 2022-05-09 DIAGNOSIS — G8252 Quadriplegia, C1-C4 incomplete: Secondary | ICD-10-CM | POA: Diagnosis not present

## 2022-05-10 DIAGNOSIS — G8252 Quadriplegia, C1-C4 incomplete: Secondary | ICD-10-CM | POA: Diagnosis not present

## 2022-05-11 DIAGNOSIS — G8252 Quadriplegia, C1-C4 incomplete: Secondary | ICD-10-CM | POA: Diagnosis not present

## 2022-05-12 DIAGNOSIS — G8252 Quadriplegia, C1-C4 incomplete: Secondary | ICD-10-CM | POA: Diagnosis not present

## 2022-05-13 DIAGNOSIS — G8252 Quadriplegia, C1-C4 incomplete: Secondary | ICD-10-CM | POA: Diagnosis not present

## 2022-05-14 DIAGNOSIS — G8252 Quadriplegia, C1-C4 incomplete: Secondary | ICD-10-CM | POA: Diagnosis not present

## 2022-05-15 DIAGNOSIS — G8252 Quadriplegia, C1-C4 incomplete: Secondary | ICD-10-CM | POA: Diagnosis not present

## 2022-05-16 DIAGNOSIS — G8252 Quadriplegia, C1-C4 incomplete: Secondary | ICD-10-CM | POA: Diagnosis not present

## 2022-05-17 DIAGNOSIS — G8252 Quadriplegia, C1-C4 incomplete: Secondary | ICD-10-CM | POA: Diagnosis not present

## 2022-05-18 DIAGNOSIS — G8252 Quadriplegia, C1-C4 incomplete: Secondary | ICD-10-CM | POA: Diagnosis not present

## 2022-05-19 DIAGNOSIS — G8252 Quadriplegia, C1-C4 incomplete: Secondary | ICD-10-CM | POA: Diagnosis not present

## 2022-05-20 DIAGNOSIS — G8252 Quadriplegia, C1-C4 incomplete: Secondary | ICD-10-CM | POA: Diagnosis not present

## 2022-05-21 DIAGNOSIS — G8252 Quadriplegia, C1-C4 incomplete: Secondary | ICD-10-CM | POA: Diagnosis not present

## 2022-05-22 DIAGNOSIS — G8252 Quadriplegia, C1-C4 incomplete: Secondary | ICD-10-CM | POA: Diagnosis not present

## 2022-05-23 DIAGNOSIS — G8252 Quadriplegia, C1-C4 incomplete: Secondary | ICD-10-CM | POA: Diagnosis not present

## 2022-05-24 DIAGNOSIS — G8252 Quadriplegia, C1-C4 incomplete: Secondary | ICD-10-CM | POA: Diagnosis not present

## 2022-05-25 DIAGNOSIS — G8252 Quadriplegia, C1-C4 incomplete: Secondary | ICD-10-CM | POA: Diagnosis not present

## 2022-05-26 DIAGNOSIS — G8252 Quadriplegia, C1-C4 incomplete: Secondary | ICD-10-CM | POA: Diagnosis not present

## 2022-05-27 DIAGNOSIS — G8252 Quadriplegia, C1-C4 incomplete: Secondary | ICD-10-CM | POA: Diagnosis not present

## 2022-05-28 DIAGNOSIS — G8252 Quadriplegia, C1-C4 incomplete: Secondary | ICD-10-CM | POA: Diagnosis not present

## 2022-05-29 DIAGNOSIS — G8252 Quadriplegia, C1-C4 incomplete: Secondary | ICD-10-CM | POA: Diagnosis not present

## 2022-05-30 DIAGNOSIS — G8252 Quadriplegia, C1-C4 incomplete: Secondary | ICD-10-CM | POA: Diagnosis not present

## 2022-05-31 DIAGNOSIS — Z7689 Persons encountering health services in other specified circumstances: Secondary | ICD-10-CM | POA: Diagnosis not present

## 2022-05-31 DIAGNOSIS — G8252 Quadriplegia, C1-C4 incomplete: Secondary | ICD-10-CM | POA: Diagnosis not present

## 2022-06-01 DIAGNOSIS — G8252 Quadriplegia, C1-C4 incomplete: Secondary | ICD-10-CM | POA: Diagnosis not present

## 2022-06-02 DIAGNOSIS — G8252 Quadriplegia, C1-C4 incomplete: Secondary | ICD-10-CM | POA: Diagnosis not present

## 2022-06-03 DIAGNOSIS — G8252 Quadriplegia, C1-C4 incomplete: Secondary | ICD-10-CM | POA: Diagnosis not present

## 2022-06-04 DIAGNOSIS — Z419 Encounter for procedure for purposes other than remedying health state, unspecified: Secondary | ICD-10-CM | POA: Diagnosis not present

## 2022-06-04 DIAGNOSIS — G8252 Quadriplegia, C1-C4 incomplete: Secondary | ICD-10-CM | POA: Diagnosis not present

## 2022-06-05 DIAGNOSIS — G8252 Quadriplegia, C1-C4 incomplete: Secondary | ICD-10-CM | POA: Diagnosis not present

## 2022-06-06 DIAGNOSIS — G8252 Quadriplegia, C1-C4 incomplete: Secondary | ICD-10-CM | POA: Diagnosis not present

## 2022-06-07 DIAGNOSIS — G8252 Quadriplegia, C1-C4 incomplete: Secondary | ICD-10-CM | POA: Diagnosis not present

## 2022-06-07 DIAGNOSIS — Z7689 Persons encountering health services in other specified circumstances: Secondary | ICD-10-CM | POA: Diagnosis not present

## 2022-06-08 DIAGNOSIS — G8252 Quadriplegia, C1-C4 incomplete: Secondary | ICD-10-CM | POA: Diagnosis not present

## 2022-06-09 DIAGNOSIS — G8252 Quadriplegia, C1-C4 incomplete: Secondary | ICD-10-CM | POA: Diagnosis not present

## 2022-06-10 DIAGNOSIS — G8252 Quadriplegia, C1-C4 incomplete: Secondary | ICD-10-CM | POA: Diagnosis not present

## 2022-06-11 DIAGNOSIS — G8252 Quadriplegia, C1-C4 incomplete: Secondary | ICD-10-CM | POA: Diagnosis not present

## 2022-06-13 DIAGNOSIS — G8252 Quadriplegia, C1-C4 incomplete: Secondary | ICD-10-CM | POA: Diagnosis not present

## 2022-06-14 DIAGNOSIS — G8252 Quadriplegia, C1-C4 incomplete: Secondary | ICD-10-CM | POA: Diagnosis not present

## 2022-06-15 DIAGNOSIS — G8252 Quadriplegia, C1-C4 incomplete: Secondary | ICD-10-CM | POA: Diagnosis not present

## 2022-06-16 DIAGNOSIS — G8252 Quadriplegia, C1-C4 incomplete: Secondary | ICD-10-CM | POA: Diagnosis not present

## 2022-06-17 DIAGNOSIS — G8252 Quadriplegia, C1-C4 incomplete: Secondary | ICD-10-CM | POA: Diagnosis not present

## 2022-06-18 DIAGNOSIS — G8252 Quadriplegia, C1-C4 incomplete: Secondary | ICD-10-CM | POA: Diagnosis not present

## 2022-06-19 DIAGNOSIS — G8252 Quadriplegia, C1-C4 incomplete: Secondary | ICD-10-CM | POA: Diagnosis not present

## 2022-06-20 DIAGNOSIS — G8252 Quadriplegia, C1-C4 incomplete: Secondary | ICD-10-CM | POA: Diagnosis not present

## 2022-06-21 DIAGNOSIS — G8252 Quadriplegia, C1-C4 incomplete: Secondary | ICD-10-CM | POA: Diagnosis not present

## 2022-06-22 DIAGNOSIS — G8252 Quadriplegia, C1-C4 incomplete: Secondary | ICD-10-CM | POA: Diagnosis not present

## 2022-06-23 DIAGNOSIS — G8252 Quadriplegia, C1-C4 incomplete: Secondary | ICD-10-CM | POA: Diagnosis not present

## 2022-06-26 DIAGNOSIS — G8252 Quadriplegia, C1-C4 incomplete: Secondary | ICD-10-CM | POA: Diagnosis not present

## 2022-06-27 DIAGNOSIS — G8252 Quadriplegia, C1-C4 incomplete: Secondary | ICD-10-CM | POA: Diagnosis not present

## 2022-06-28 DIAGNOSIS — G8252 Quadriplegia, C1-C4 incomplete: Secondary | ICD-10-CM | POA: Diagnosis not present

## 2022-06-29 DIAGNOSIS — G8252 Quadriplegia, C1-C4 incomplete: Secondary | ICD-10-CM | POA: Diagnosis not present

## 2022-06-30 DIAGNOSIS — G8252 Quadriplegia, C1-C4 incomplete: Secondary | ICD-10-CM | POA: Diagnosis not present

## 2022-07-01 DIAGNOSIS — G8252 Quadriplegia, C1-C4 incomplete: Secondary | ICD-10-CM | POA: Diagnosis not present

## 2022-07-02 DIAGNOSIS — G8252 Quadriplegia, C1-C4 incomplete: Secondary | ICD-10-CM | POA: Diagnosis not present

## 2022-07-03 DIAGNOSIS — G8252 Quadriplegia, C1-C4 incomplete: Secondary | ICD-10-CM | POA: Diagnosis not present

## 2022-07-04 DIAGNOSIS — G8252 Quadriplegia, C1-C4 incomplete: Secondary | ICD-10-CM | POA: Diagnosis not present

## 2022-07-05 DIAGNOSIS — G8252 Quadriplegia, C1-C4 incomplete: Secondary | ICD-10-CM | POA: Diagnosis not present

## 2022-07-05 DIAGNOSIS — Z7689 Persons encountering health services in other specified circumstances: Secondary | ICD-10-CM | POA: Diagnosis not present

## 2022-07-05 DIAGNOSIS — Z419 Encounter for procedure for purposes other than remedying health state, unspecified: Secondary | ICD-10-CM | POA: Diagnosis not present

## 2022-07-06 DIAGNOSIS — G8252 Quadriplegia, C1-C4 incomplete: Secondary | ICD-10-CM | POA: Diagnosis not present

## 2022-07-07 DIAGNOSIS — G8252 Quadriplegia, C1-C4 incomplete: Secondary | ICD-10-CM | POA: Diagnosis not present

## 2022-07-08 DIAGNOSIS — G8252 Quadriplegia, C1-C4 incomplete: Secondary | ICD-10-CM | POA: Diagnosis not present

## 2022-07-09 DIAGNOSIS — G8252 Quadriplegia, C1-C4 incomplete: Secondary | ICD-10-CM | POA: Diagnosis not present

## 2022-07-10 DIAGNOSIS — G8252 Quadriplegia, C1-C4 incomplete: Secondary | ICD-10-CM | POA: Diagnosis not present

## 2022-07-11 DIAGNOSIS — G8252 Quadriplegia, C1-C4 incomplete: Secondary | ICD-10-CM | POA: Diagnosis not present

## 2022-07-12 DIAGNOSIS — G8252 Quadriplegia, C1-C4 incomplete: Secondary | ICD-10-CM | POA: Diagnosis not present

## 2022-07-13 DIAGNOSIS — G8252 Quadriplegia, C1-C4 incomplete: Secondary | ICD-10-CM | POA: Diagnosis not present

## 2022-07-14 DIAGNOSIS — G8252 Quadriplegia, C1-C4 incomplete: Secondary | ICD-10-CM | POA: Diagnosis not present

## 2022-07-15 DIAGNOSIS — G8252 Quadriplegia, C1-C4 incomplete: Secondary | ICD-10-CM | POA: Diagnosis not present

## 2022-07-16 DIAGNOSIS — G8252 Quadriplegia, C1-C4 incomplete: Secondary | ICD-10-CM | POA: Diagnosis not present

## 2022-07-17 DIAGNOSIS — G8252 Quadriplegia, C1-C4 incomplete: Secondary | ICD-10-CM | POA: Diagnosis not present

## 2022-07-18 DIAGNOSIS — G8252 Quadriplegia, C1-C4 incomplete: Secondary | ICD-10-CM | POA: Diagnosis not present

## 2022-07-19 DIAGNOSIS — Z7689 Persons encountering health services in other specified circumstances: Secondary | ICD-10-CM | POA: Diagnosis not present

## 2022-07-19 DIAGNOSIS — G8252 Quadriplegia, C1-C4 incomplete: Secondary | ICD-10-CM | POA: Diagnosis not present

## 2022-07-20 DIAGNOSIS — G8252 Quadriplegia, C1-C4 incomplete: Secondary | ICD-10-CM | POA: Diagnosis not present

## 2022-07-21 DIAGNOSIS — G8252 Quadriplegia, C1-C4 incomplete: Secondary | ICD-10-CM | POA: Diagnosis not present

## 2022-07-22 DIAGNOSIS — G8252 Quadriplegia, C1-C4 incomplete: Secondary | ICD-10-CM | POA: Diagnosis not present

## 2022-07-23 DIAGNOSIS — G8252 Quadriplegia, C1-C4 incomplete: Secondary | ICD-10-CM | POA: Diagnosis not present

## 2022-07-24 DIAGNOSIS — G8252 Quadriplegia, C1-C4 incomplete: Secondary | ICD-10-CM | POA: Diagnosis not present

## 2022-07-25 DIAGNOSIS — G8252 Quadriplegia, C1-C4 incomplete: Secondary | ICD-10-CM | POA: Diagnosis not present

## 2022-07-26 DIAGNOSIS — G8252 Quadriplegia, C1-C4 incomplete: Secondary | ICD-10-CM | POA: Diagnosis not present

## 2022-07-27 DIAGNOSIS — G8252 Quadriplegia, C1-C4 incomplete: Secondary | ICD-10-CM | POA: Diagnosis not present

## 2022-07-28 DIAGNOSIS — G8252 Quadriplegia, C1-C4 incomplete: Secondary | ICD-10-CM | POA: Diagnosis not present

## 2022-07-29 DIAGNOSIS — G8252 Quadriplegia, C1-C4 incomplete: Secondary | ICD-10-CM | POA: Diagnosis not present

## 2022-07-30 DIAGNOSIS — G8252 Quadriplegia, C1-C4 incomplete: Secondary | ICD-10-CM | POA: Diagnosis not present

## 2022-07-31 DIAGNOSIS — G8252 Quadriplegia, C1-C4 incomplete: Secondary | ICD-10-CM | POA: Diagnosis not present

## 2022-08-01 DIAGNOSIS — G8252 Quadriplegia, C1-C4 incomplete: Secondary | ICD-10-CM | POA: Diagnosis not present

## 2022-08-02 DIAGNOSIS — G8252 Quadriplegia, C1-C4 incomplete: Secondary | ICD-10-CM | POA: Diagnosis not present

## 2022-08-03 DIAGNOSIS — G8252 Quadriplegia, C1-C4 incomplete: Secondary | ICD-10-CM | POA: Diagnosis not present

## 2022-08-04 DIAGNOSIS — Z419 Encounter for procedure for purposes other than remedying health state, unspecified: Secondary | ICD-10-CM | POA: Diagnosis not present

## 2022-08-04 DIAGNOSIS — G8252 Quadriplegia, C1-C4 incomplete: Secondary | ICD-10-CM | POA: Diagnosis not present

## 2022-08-05 DIAGNOSIS — G8252 Quadriplegia, C1-C4 incomplete: Secondary | ICD-10-CM | POA: Diagnosis not present

## 2022-08-06 DIAGNOSIS — G8252 Quadriplegia, C1-C4 incomplete: Secondary | ICD-10-CM | POA: Diagnosis not present

## 2022-08-07 DIAGNOSIS — G8252 Quadriplegia, C1-C4 incomplete: Secondary | ICD-10-CM | POA: Diagnosis not present

## 2022-08-08 DIAGNOSIS — G8252 Quadriplegia, C1-C4 incomplete: Secondary | ICD-10-CM | POA: Diagnosis not present

## 2022-08-09 DIAGNOSIS — G8252 Quadriplegia, C1-C4 incomplete: Secondary | ICD-10-CM | POA: Diagnosis not present

## 2022-08-10 DIAGNOSIS — G8252 Quadriplegia, C1-C4 incomplete: Secondary | ICD-10-CM | POA: Diagnosis not present

## 2022-08-11 DIAGNOSIS — G8252 Quadriplegia, C1-C4 incomplete: Secondary | ICD-10-CM | POA: Diagnosis not present

## 2022-08-12 DIAGNOSIS — G8252 Quadriplegia, C1-C4 incomplete: Secondary | ICD-10-CM | POA: Diagnosis not present

## 2022-08-13 DIAGNOSIS — G8252 Quadriplegia, C1-C4 incomplete: Secondary | ICD-10-CM | POA: Diagnosis not present

## 2022-08-14 DIAGNOSIS — G8252 Quadriplegia, C1-C4 incomplete: Secondary | ICD-10-CM | POA: Diagnosis not present

## 2022-08-14 NOTE — Therapy (Signed)
OUTPATIENT PHYSICAL THERAPY NEURO EVALUATION   Patient Name: Carlos [name] MRN: [id number] DOB:[birth year], 56 y.o., male Today's Date: 08/16/2022   PCP: none REFERRING PROVIDER: Tommas Olp, MD   PT End of Session - 08/16/22 1110     Visit Number 1    Number of Visits 17    Date for PT Re-Evaluation 10/11/22    Authorization Type [employer]    Authorization - Visit Number 5    Authorization - Number of Visits 27   combined; already used 4   PT Start Time 1016    PT Stop Time 1105    PT Time Calculation (min) 49 min    Equipment Utilized During Treatment Gait belt    Activity Tolerance Patient tolerated treatment well    Behavior During Therapy WFL for tasks assessed/performed             Past Medical History:  Diagnosis Date   Crush injury to hand 12/2018   bilateral hands with multipe fractures   Finger osteomyelitis, left ([place]) 12/2018   Past Surgical History:  Procedure Laterality Date   I & D EXTREMITY Left 09/23/2019   Procedure: IRRIGATION AND DEBRIDEMENT OF  LEFT LONG FINGER, REVISION OF LEFT LONG FINGER AMPUTATION;  Surgeon: Verner Mould, MD;  Location: [facility];  Service: Orthopedics;  Laterality: Left;   partial amputation left 3rd finger Left 12/2018   Patient Active Problem List   Diagnosis Date Noted   Finger osteomyelitis, left ([place]) 09/23/2019   Leukocytosis 09/23/2019    ONSET DATE: 11/23/2020  REFERRING DIAG: 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, unspecified  THERAPY DIAG:  Muscle weakness (generalized)  Other symptoms and signs involving the nervous system  Abnormal posture  Difficulty in walking, not elsewhere classified  Rationale for Evaluation and Treatment Rehabilitation  SUBJECTIVE:                                                                                                                                                                                              SUBJECTIVE STATEMENT: Patient  reports that he had PT earlier this month and it was a "disaster" felt like there were no goals and all he did was work on standing and sitting. Paid out of pocket to be seen in the pool in [place] and requesting to do aquatic therapy now. Has an assistant come in 3 days a week who does max transfers but someone else is always with him at home. Does not use a sliding board d/t contracture of the L hand which has been there since after his surgery but has one at home. Has  an IT trainer wheelchair which "I dont acknowledge" because the power chair he had before rolled over the L foot and he had to recover from that. Using bedpan for toileting d/t inability to transfer onto commode. Getting Botox injections this Monday- has them every 3 months in the L UE. Had OT and a splint on the L hand- not using it.    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 No  PRECAUTIONS: Fall  WEIGHT BEARING RESTRICTIONS No  FALLS: Has patient fallen in last 6 months? No  LIVING ENVIRONMENT: Lives with:  son and daughter Lives in: House/apartment Stairs:  has a Scientist, water quality ramp" to enter; 1 story home  Has following equipment at home:  manual w/c, power w/c, walker, sliding board  PLOF: Needs assistance with ADLs and Needs assistance with transfers  PATIENT GOALS get into the water  OBJECTIVE:   DIAGNOSTIC FINDINGS: none recent  COGNITION: Overall cognitive status: difficult to assess   SENSATION: Reports no sensation in either hand/forearm   COORDINATION: Unable on L d/t contracture Alternating pronation/supination: mild dysmetria and spasticity in R fingers Alternating toe tap: intact B Finger to nose: mild dysmetria and spasticity in R fingers   TONE: increased in B ankles; L>R hand spasticity/tone  POSTURE: rounded shoulders and posterior pelvic  tilt; L wt shift  OBSERVATION: raised rash over the L LB and midback- mother reports that this is new and did not know it was there   LOWER EXTREMITY ROM:     Active  Right Eval Left Eval  Hip flexion    Hip extension    Hip abduction    Hip adduction    Hip internal rotation    Hip external rotation    Knee flexion    Knee extension 45 40  Ankle dorsiflexion 0 9  Ankle plantarflexion    Ankle inversion    Ankle eversion     (Blank rows = not tested)  LOWER EXTREMITY MMT:    MMT (in sitting) Right Eval Left Eval  Hip flexion 4 4  Hip extension    Hip abduction 4 4  Hip adduction 4- 3+  Hip internal rotation    Hip external rotation    Knee flexion 4 4-  Knee extension 3+ 3+  Ankle dorsiflexion 4 3+  Ankle plantarflexion 2+ 2+  Ankle inversion    Ankle eversion    (Blank rows = not tested)   TRANSFERS: Assistive device utilized: Wheelchair (manual)  Squat pivot transfer w/c>mat: max A x 2; mod A with scooting and set up of transfer Squat pivot transfer mat>w/c: max A x 2  GAIT: unable  FUNCTIONAL TESTs:   FIST: 34/56    PATIENT EDUCATION: Education details: prognosis, POC, HEP; discussed aquatic therapy and need for land exercises as well for max benefit and for reassessments  Person educated: Patient and Parent Education method: Explanation, Demonstration, Tactile cues, Verbal cues, and Handouts Education comprehension: verbalized understanding   HOME EXERCISE PROGRAM: Access Code: [email or web address] URL: [email or web address] Date: 08/16/2022 Prepared by: [facility]  Exercises - Seated Long Arc Quad  - 1 x daily - 5 x weekly - 2 sets - 10 reps - Seated March  - 1 x daily - 5 x weekly - 2 sets - 10 reps - Seated Hip Abduction  - 1 x daily -  5 x weekly - 2 sets - 10 reps - Seated Gastroc Stretch with Strap  - 1 x daily - 5 x weekly - 2 sets - 10 reps    GOALS: Goals reviewed with patient?  Yes  SHORT TERM GOALS: Target date: 09/06/2022  Patient to be independent with initial HEP. Baseline: HEP initiated Goal status: INITIAL    LONG TERM GOALS: Target date: 10/11/2022  Patient to be independent with advanced HEP. Baseline: Not yet initiated  Goal status: INITIAL  Patient to demonstrate B ankle dorsiflexion AROM WFL. Baseline: 0, 9 deg Goal status: INITIAL  Patient to score atleast 40 on FIST in order to reach [place] in sitting balance.  Baseline: 34 Goal status: INITIAL  Patient to complete squat pivot or sliding board transfer with mod A in order to reduce caregiver burden and patient's independence..   Baseline: max A x 2 Goal status: INITIAL  ASSESSMENT:  CLINICAL IMPRESSION:  Patient is a 56 y/o M presenting to OPPT with c/o weakness and difficulty with transfers s/p resection of intraspinal ependymoma s/p and C3-C7 laminectomies on 11/23/2020. Patient reports total dependence for transfers, which he performs 3x/week with PCA. Otherwise, bed/w/c dependent and using bedpan for toileting. Patient reports that he owns power w/c, sliding board, and L hand splint but not using as this time; prefers manual w/c. Patient is requesting aquatic therapy d/t good experience with it in the past. Patient today presenting with lack of sensation in B hands, limited R UE coordination, abnormal posture, increased tone in B ankles and L>R hand, limited B ankle and knee ROM, decreased B LE strength, dependence with transfers, and limited sitting balance. Patient was educated on gentle strengthening and stretching HEP and reported understanding. Would benefit from skilled PT services 1-2 x/week for 8 weeks to address aforementioned impairments in order to optimize level of function.     OBJECTIVE IMPAIRMENTS Abnormal gait, decreased activity tolerance, decreased balance, decreased coordination, decreased knowledge of use of DME, decreased mobility, difficulty walking, decreased ROM,  decreased strength, increased fascial restrictions, impaired perceived functional ability, increased muscle spasms, impaired flexibility, impaired sensation, impaired tone, impaired UE functional use, improper body mechanics, and postural dysfunction.   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   REHAB POTENTIAL: Good  CLINICAL DECISION MAKING: Evolving/moderate complexity  EVALUATION COMPLEXITY: Moderate  PLAN: PT FREQUENCY: 1-2x/week  PT DURATION: 8 weeks  PLANNED INTERVENTIONS: Therapeutic exercises, Therapeutic activity, Neuromuscular re-education, Balance training, Gait training, Patient/Family education, Self Care, Joint mobilization, Stair training, Vestibular training, Canalith repositioning, Orthotic/Fit training, DME instructions, Aquatic Therapy, Dry Needling, Electrical stimulation, Wheelchair mobility training, Cryotherapy, Moist heat, Taping, Manual therapy, and Re-evaluation  PLAN FOR NEXT SESSION: reassess HEP; progress LE strengthening and stretching, work on squat pivot/sliding board transfers, standing frame?   Janene Harvey, PT, DPT 08/16/22 11:48 AM  [facility] at [facility] [address] [place], [place], [place] [address] Phone # [phone] Fax # [phone]

## 2022-08-15 DIAGNOSIS — G8252 Quadriplegia, C1-C4 incomplete: Secondary | ICD-10-CM | POA: Diagnosis not present

## 2022-08-16 ENCOUNTER — Ambulatory Visit: Payer: Medicaid Other | Attending: Neurology | Admitting: Physical Therapy

## 2022-08-16 ENCOUNTER — Other Ambulatory Visit: Payer: Self-pay

## 2022-08-16 DIAGNOSIS — R29898 Other symptoms and signs involving the musculoskeletal system: Secondary | ICD-10-CM | POA: Diagnosis not present

## 2022-08-16 DIAGNOSIS — R29818 Other symptoms and signs involving the nervous system: Secondary | ICD-10-CM | POA: Insufficient documentation

## 2022-08-16 DIAGNOSIS — R293 Abnormal posture: Secondary | ICD-10-CM | POA: Diagnosis not present

## 2022-08-16 DIAGNOSIS — R262 Difficulty in walking, not elsewhere classified: Secondary | ICD-10-CM | POA: Diagnosis not present

## 2022-08-16 DIAGNOSIS — M6281 Muscle weakness (generalized): Secondary | ICD-10-CM | POA: Diagnosis not present

## 2022-08-16 DIAGNOSIS — G8252 Quadriplegia, C1-C4 incomplete: Secondary | ICD-10-CM | POA: Diagnosis not present

## 2022-08-17 DIAGNOSIS — G8252 Quadriplegia, C1-C4 incomplete: Secondary | ICD-10-CM | POA: Diagnosis not present

## 2022-08-18 DIAGNOSIS — G8252 Quadriplegia, C1-C4 incomplete: Secondary | ICD-10-CM | POA: Diagnosis not present

## 2022-08-19 DIAGNOSIS — G8252 Quadriplegia, C1-C4 incomplete: Secondary | ICD-10-CM | POA: Diagnosis not present

## 2022-08-20 DIAGNOSIS — G8252 Quadriplegia, C1-C4 incomplete: Secondary | ICD-10-CM | POA: Diagnosis not present

## 2022-08-21 DIAGNOSIS — G8252 Quadriplegia, C1-C4 incomplete: Secondary | ICD-10-CM | POA: Diagnosis not present

## 2022-08-22 ENCOUNTER — Ambulatory Visit: Payer: Medicaid Other | Admitting: Physical Therapy

## 2022-08-22 DIAGNOSIS — G8252 Quadriplegia, C1-C4 incomplete: Secondary | ICD-10-CM | POA: Diagnosis not present

## 2022-08-23 DIAGNOSIS — G8252 Quadriplegia, C1-C4 incomplete: Secondary | ICD-10-CM | POA: Diagnosis not present

## 2022-08-24 DIAGNOSIS — G8252 Quadriplegia, C1-C4 incomplete: Secondary | ICD-10-CM | POA: Diagnosis not present

## 2022-08-25 DIAGNOSIS — G8252 Quadriplegia, C1-C4 incomplete: Secondary | ICD-10-CM | POA: Diagnosis not present

## 2022-08-26 DIAGNOSIS — G8252 Quadriplegia, C1-C4 incomplete: Secondary | ICD-10-CM | POA: Diagnosis not present

## 2022-08-27 DIAGNOSIS — G8252 Quadriplegia, C1-C4 incomplete: Secondary | ICD-10-CM | POA: Diagnosis not present

## 2022-08-28 ENCOUNTER — Ambulatory Visit: Payer: Medicaid Other

## 2022-08-28 DIAGNOSIS — R29818 Other symptoms and signs involving the nervous system: Secondary | ICD-10-CM | POA: Diagnosis not present

## 2022-08-28 DIAGNOSIS — M6281 Muscle weakness (generalized): Secondary | ICD-10-CM | POA: Diagnosis not present

## 2022-08-28 DIAGNOSIS — R262 Difficulty in walking, not elsewhere classified: Secondary | ICD-10-CM

## 2022-08-28 DIAGNOSIS — R29898 Other symptoms and signs involving the musculoskeletal system: Secondary | ICD-10-CM | POA: Diagnosis not present

## 2022-08-28 DIAGNOSIS — G8252 Quadriplegia, C1-C4 incomplete: Secondary | ICD-10-CM | POA: Diagnosis not present

## 2022-08-28 DIAGNOSIS — R293 Abnormal posture: Secondary | ICD-10-CM | POA: Diagnosis not present

## 2022-08-28 DIAGNOSIS — Z7689 Persons encountering health services in other specified circumstances: Secondary | ICD-10-CM | POA: Diagnosis not present

## 2022-08-28 NOTE — Therapy (Signed)
OUTPATIENT PHYSICAL THERAPY NEURO TREATMENT   Patient Name: Carlos [name] MRN: [id number] DOB:Oct 19, 1966, 56 y.o., male Today's Date: 08/28/2022   PCP: none REFERRING PROVIDER: Tommas Olp, MD   PT End of Session - 08/28/22 0854     Visit Number 2    Number of Visits 17    Date for PT Re-Evaluation 10/11/22    Authorization Type [employer]    Authorization - Visit Number 6    Authorization - Number of Visits 27   combined; already used 4   PT Start Time 0853   late arrival   PT Stop Time 0932    PT Time Calculation (min) 39 min    Equipment Utilized During Treatment Gait belt    Activity Tolerance Patient tolerated treatment well    Behavior During Therapy WFL for tasks assessed/performed             Past Medical History:  Diagnosis Date   Crush injury to hand 12/2018   bilateral hands with multipe fractures   Finger osteomyelitis, 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   Past Surgical History:  Procedure Laterality Date   I & D EXTREMITY Left 09/23/2019   Procedure: IRRIGATION AND DEBRIDEMENT OF  LEFT LONG FINGER, REVISION OF LEFT LONG FINGER AMPUTATION;  Surgeon: Verner Mould, MD;  Location: [facility];  Service: Orthopedics;  Laterality: Left;   partial amputation left 3rd finger Left 12/2018   Patient Active Problem List   Diagnosis Date Noted   Finger osteomyelitis, left ([place]) 09/23/2019   Leukocytosis 09/23/2019    ONSET DATE: 11/23/2020  REFERRING DIAG: 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, unspecified  THERAPY DIAG:  Muscle weakness (generalized)  Other symptoms and signs involving the nervous system  Abnormal posture  Difficulty in walking, not elsewhere classified  Other symptoms and signs involving the musculoskeletal system  Rationale for Evaluation and Treatment Rehabilitation  SUBJECTIVE:                                                                                                                                                                                               SUBJECTIVE STATEMENT: Notes that he was able to squat-pivot with caregiver this morning   Pt accompanied by: self and family member mother  PERTINENT HISTORY: 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 No  PRECAUTIONS: Fall  WEIGHT BEARING RESTRICTIONS No  FALLS: Has patient fallen in last 6 months? No  LIVING ENVIRONMENT: Lives with:  son and daughter Lives in: House/apartment Stairs:  has a Scientist, water quality ramp" to enter; 1 story  home  Has following equipment at home:  manual w/c, power w/c, walker, sliding board  PLOF: Needs assistance with ADLs and Needs assistance with transfers  PATIENT GOALS get into the water  OBJECTIVE:   TODAY'S TREATMENT: 08/28/22 Activity Comments  Transfer training/functional mobility -Slideboard w/c to EOM max A w/ heavy cues in sequence and LE placement -scooting EOM with drawsheet and cues in "heels pointed in the direction of scooting" and facilitation of BLE and RUE coordination and blocking knees for extension feedback -return slideboard transfer EOM to w/c with mod A and improved carryover of foot position   Therapist assisted LE stretching 2x60 sec hamstring 1x60 sec DKTC (unable to tolerate LLE) Gastroc stretch 2x60 sec  Education regarding DME Given pt height he would benefit greatly from high-back manual w/c to promote upright posture/position/pressure relief -Beezy board transfer board -wrist splint for RUE to maintain tenodesis              DIAGNOSTIC FINDINGS: none recent  COGNITION: Overall cognitive status: difficult to assess   SENSATION: Reports no sensation in either hand/forearm   COORDINATION: Unable on L d/t contracture Alternating pronation/supination: mild dysmetria and spasticity in R fingers Alternating toe tap: intact B Finger to nose: mild dysmetria and spasticity in R  fingers   TONE: increased in B ankles; L>R hand spasticity/tone  POSTURE: rounded shoulders and posterior pelvic tilt; L wt shift  OBSERVATION: raised rash over the L LB and midback- mother reports that this is new and did not know it was there   LOWER EXTREMITY ROM:     Active  Right Eval Left Eval  Hip flexion    Hip extension    Hip abduction    Hip adduction    Hip internal rotation    Hip external rotation    Knee flexion    Knee extension 45 40  Ankle dorsiflexion 0 9  Ankle plantarflexion    Ankle inversion    Ankle eversion     (Blank rows = not tested)  LOWER EXTREMITY MMT:    MMT (in sitting) Right Eval Left Eval  Hip flexion 4 4  Hip extension    Hip abduction 4 4  Hip adduction 4- 3+  Hip internal rotation    Hip external rotation    Knee flexion 4 4-  Knee extension 3+ 3+  Ankle dorsiflexion 4 3+  Ankle plantarflexion 2+ 2+  Ankle inversion    Ankle eversion    (Blank rows = not tested)   TRANSFERS: Assistive device utilized: Wheelchair (manual)  Squat pivot transfer w/c>mat: max A x 2; mod A with scooting and set up of transfer Squat pivot transfer mat>w/c: max A x 2  GAIT: unable  FUNCTIONAL TESTs:   FIST: 34/56    PATIENT EDUCATION: Education details: prognosis, POC, HEP; discussed aquatic therapy and need for land exercises as well for max benefit and for reassessments  Person educated: Patient and Parent Education method: Explanation, Demonstration, Tactile cues, Verbal cues, and Handouts Education comprehension: verbalized understanding   HOME EXERCISE PROGRAM: Access Code: [email or web address] URL: [email or web address] Date: 08/16/2022 Prepared by: [facility]  Exercises - Seated Long Arc Quad  - 1 x daily - 5 x weekly - 2 sets - 10 reps - Seated March  - 1 x daily - 5 x weekly - 2 sets - 10 reps - Seated Hip Abduction  - 1 x daily - 5 x weekly - 2 sets -  10 reps - Seated Gastroc  Stretch with Strap  - 1 x daily - 5 x weekly - 2 sets - 10 reps    GOALS: Goals reviewed with patient? Yes  SHORT TERM GOALS: Target date: 09/06/2022  Patient to be independent with initial HEP. Baseline: HEP initiated Goal status: INITIAL    LONG TERM GOALS: Target date: 10/11/2022  Patient to be independent with advanced HEP. Baseline: Not yet initiated  Goal status: INITIAL  Patient to demonstrate B ankle dorsiflexion AROM WFL. Baseline: 0, 9 deg Goal status: INITIAL  Patient to score atleast 40 on FIST in order to reach [place] in sitting balance.  Baseline: 34 Goal status: INITIAL  Patient to complete squat pivot or sliding board transfer with mod A in order to reduce caregiver burden and patient's independence..   Baseline: max A x 2 Goal status: INITIAL  ASSESSMENT:  CLINICAL IMPRESSION: Heavy cues and facilitation for coordination in scooting with increased difficulty when leading to the left vs right but overall improved coordination and carryover of LE position to facilitate improved body mechanics. Recommend use of high-back manual wheelchair to improve positioning and posture to facilitate increased time/activity OOB since pt is unwilling to use his power chair. Continued sessions to progess POC details    OBJECTIVE IMPAIRMENTS Abnormal gait, decreased activity tolerance, decreased balance, decreased coordination, decreased knowledge of use of DME, decreased mobility, difficulty walking, decreased ROM, decreased strength, increased fascial restrictions, impaired perceived functional ability, increased muscle spasms, impaired flexibility, impaired sensation, impaired tone, impaired UE functional use, improper body mechanics, and postural dysfunction.   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   REHAB POTENTIAL: Good  CLINICAL DECISION MAKING: Evolving/moderate complexity  EVALUATION COMPLEXITY: Moderate  PLAN: PT FREQUENCY: 1-2x/week  PT DURATION: 8 weeks  PLANNED INTERVENTIONS: Therapeutic exercises, Therapeutic activity, Neuromuscular re-education, Balance training, Gait training, Patient/Family education, Self Care, Joint mobilization, Stair training, Vestibular training, Canalith repositioning, Orthotic/Fit training, DME instructions, Aquatic Therapy, Dry Needling, Electrical stimulation, Wheelchair mobility training, Cryotherapy, Moist heat, Taping, Manual therapy, and Re-evaluation  PLAN FOR NEXT SESSION: reassess HEP; progress LE strengthening and stretching, work on squat pivot/sliding board transfers, standing frame?    [facility] at [facility] [place], [place], [place] [address] Phone # [phone] Fax # [phone]

## 2022-08-29 DIAGNOSIS — G8252 Quadriplegia, C1-C4 incomplete: Secondary | ICD-10-CM | POA: Diagnosis not present

## 2022-08-30 DIAGNOSIS — G8252 Quadriplegia, C1-C4 incomplete: Secondary | ICD-10-CM | POA: Diagnosis not present

## 2022-08-31 DIAGNOSIS — G8252 Quadriplegia, C1-C4 incomplete: Secondary | ICD-10-CM | POA: Diagnosis not present

## 2022-09-01 DIAGNOSIS — G8252 Quadriplegia, C1-C4 incomplete: Secondary | ICD-10-CM | POA: Diagnosis not present

## 2022-09-02 DIAGNOSIS — G8252 Quadriplegia, C1-C4 incomplete: Secondary | ICD-10-CM | POA: Diagnosis not present

## 2022-09-03 DIAGNOSIS — G8252 Quadriplegia, C1-C4 incomplete: Secondary | ICD-10-CM | POA: Diagnosis not present

## 2022-09-04 DIAGNOSIS — G8252 Quadriplegia, C1-C4 incomplete: Secondary | ICD-10-CM | POA: Diagnosis not present

## 2022-09-04 DIAGNOSIS — Z419 Encounter for procedure for purposes other than remedying health state, unspecified: Secondary | ICD-10-CM | POA: Diagnosis not present

## 2022-09-05 DIAGNOSIS — G8252 Quadriplegia, C1-C4 incomplete: Secondary | ICD-10-CM | POA: Diagnosis not present

## 2022-09-06 ENCOUNTER — Ambulatory Visit: Payer: Medicaid Other | Admitting: Physical Therapy

## 2022-09-06 DIAGNOSIS — G8252 Quadriplegia, C1-C4 incomplete: Secondary | ICD-10-CM | POA: Diagnosis not present

## 2022-09-07 DIAGNOSIS — G8252 Quadriplegia, C1-C4 incomplete: Secondary | ICD-10-CM | POA: Diagnosis not present

## 2022-09-08 DIAGNOSIS — G8252 Quadriplegia, C1-C4 incomplete: Secondary | ICD-10-CM | POA: Diagnosis not present

## 2022-09-09 DIAGNOSIS — G8252 Quadriplegia, C1-C4 incomplete: Secondary | ICD-10-CM | POA: Diagnosis not present

## 2022-09-10 DIAGNOSIS — G8252 Quadriplegia, C1-C4 incomplete: Secondary | ICD-10-CM | POA: Diagnosis not present

## 2022-09-11 ENCOUNTER — Ambulatory Visit: Payer: Medicaid Other | Attending: Neurology

## 2022-09-11 DIAGNOSIS — R262 Difficulty in walking, not elsewhere classified: Secondary | ICD-10-CM | POA: Insufficient documentation

## 2022-09-11 DIAGNOSIS — M6281 Muscle weakness (generalized): Secondary | ICD-10-CM | POA: Diagnosis not present

## 2022-09-11 DIAGNOSIS — R29898 Other symptoms and signs involving the musculoskeletal system: Secondary | ICD-10-CM | POA: Diagnosis not present

## 2022-09-11 DIAGNOSIS — R293 Abnormal posture: Secondary | ICD-10-CM | POA: Insufficient documentation

## 2022-09-11 DIAGNOSIS — G8252 Quadriplegia, C1-C4 incomplete: Secondary | ICD-10-CM | POA: Diagnosis not present

## 2022-09-11 DIAGNOSIS — R29818 Other symptoms and signs involving the nervous system: Secondary | ICD-10-CM | POA: Diagnosis not present

## 2022-09-11 NOTE — Therapy (Signed)
OUTPATIENT PHYSICAL THERAPY NEURO TREATMENT   Patient Name: Carlos [name] MRN: [id number] DOB:16-Aug-1966, 56 y.o., male Today's Date: 09/11/2022   PCP: none REFERRING PROVIDER: Tommas Olp, MD   PT End of Session - 09/11/22 0852     Visit Number 3    Number of Visits 17    Date for PT Re-Evaluation 10/11/22    Authorization Type [employer]    Authorization - Visit Number 7    Authorization - Number of Visits 27   combined; already used 4   PT Start Time 0852    PT Stop Time 0930    PT Time Calculation (min) 38 min    Equipment Utilized During Treatment Gait belt    Activity Tolerance Patient tolerated treatment well    Behavior During Therapy WFL for tasks assessed/performed             Past Medical History:  Diagnosis Date   Crush injury to hand 12/2018   bilateral hands with multipe fractures   Finger osteomyelitis, left ([place]) 12/2018   Past Surgical History:  Procedure Laterality Date   I & D EXTREMITY Left 09/23/2019   Procedure: IRRIGATION AND DEBRIDEMENT OF  LEFT LONG FINGER, REVISION OF LEFT LONG FINGER AMPUTATION;  Surgeon: Verner Mould, MD;  Location: [place];  Service: Orthopedics;  Laterality: Left;   partial amputation left 3rd finger Left 12/2018   Patient Active Problem List   Diagnosis Date Noted   Finger osteomyelitis, left ([place]) 09/23/2019   Leukocytosis 09/23/2019    ONSET DATE: 11/23/2020  REFERRING DIAG: 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, unspecified  THERAPY DIAG:  Muscle weakness (generalized)  Other symptoms and signs involving the nervous system  Abnormal posture  Difficulty in walking, not elsewhere classified  Other symptoms and signs involving the musculoskeletal system  Rationale for Evaluation and Treatment Rehabilitation  SUBJECTIVE:                                                                                                                                                                                               SUBJECTIVE STATEMENT: Feeling pretty good   Pt accompanied by: self and family member mother  PERTINENT HISTORY: 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 No  PRECAUTIONS: Fall  WEIGHT BEARING RESTRICTIONS No  FALLS: Has patient fallen in last 6 months? No  LIVING ENVIRONMENT: Lives with:  son and daughter Lives in: House/apartment Stairs:  has a Scientist, water quality ramp" to enter; 1 story home  Has following equipment at home:  manual w/c, power  w/c, walker, sliding board  PLOF: Needs assistance with ADLs and Needs assistance with transfers  PATIENT GOALS get into the water  OBJECTIVE:   TODAY'S TREATMENT: 09/11/22 Activity Comments  Transfer training -parallel bars and draw sheet, cues in trunk flexion over BOS and push-off for LE facilitation with therapist blocking knees for extension, 10 trials w/ goal of buttocks clearance from seat--50% success  Seated there. Ex (3x10) QS, hip add iso, hip abd iso (manual resist)                 TODAY'S TREATMENT: 08/28/22 Activity Comments  Transfer training/functional mobility -Slideboard w/c to EOM max A w/ heavy cues in sequence and LE placement -scooting EOM with drawsheet and cues in "heels pointed in the direction of scooting" and facilitation of BLE and RUE coordination and blocking knees for extension feedback -return slideboard transfer EOM to w/c with mod A and improved carryover of foot position   Therapist assisted LE stretching 2x60 sec hamstring 1x60 sec DKTC (unable to tolerate LLE) Gastroc stretch 2x60 sec  Education regarding DME Given pt height he would benefit greatly from high-back manual w/c to promote upright posture/position/pressure relief -Beezy board transfer board -wrist splint for RUE to maintain tenodesis              DIAGNOSTIC FINDINGS: none recent  COGNITION: Overall cognitive status:  difficult to assess   SENSATION: Reports no sensation in either hand/forearm   COORDINATION: Unable on L d/t contracture Alternating pronation/supination: mild dysmetria and spasticity in R fingers Alternating toe tap: intact B Finger to nose: mild dysmetria and spasticity in R fingers   TONE: increased in B ankles; L>R hand spasticity/tone  POSTURE: rounded shoulders and posterior pelvic tilt; L wt shift  OBSERVATION: raised rash over the L LB and midback- mother reports that this is new and did not know it was there   LOWER EXTREMITY ROM:     Active  Right Eval Left Eval  Hip flexion    Hip extension    Hip abduction    Hip adduction    Hip internal rotation    Hip external rotation    Knee flexion    Knee extension 45 40  Ankle dorsiflexion 0 9  Ankle plantarflexion    Ankle inversion    Ankle eversion     (Blank rows = not tested)  LOWER EXTREMITY MMT:    MMT (in sitting) Right Eval Left Eval  Hip flexion 4 4  Hip extension    Hip abduction 4 4  Hip adduction 4- 3+  Hip internal rotation    Hip external rotation    Knee flexion 4 4-  Knee extension 3+ 3+  Ankle dorsiflexion 4 3+  Ankle plantarflexion 2+ 2+  Ankle inversion    Ankle eversion    (Blank rows = not tested)   TRANSFERS: Assistive device utilized: Wheelchair (manual)  Squat pivot transfer w/c>mat: max A x 2; mod A with scooting and set up of transfer Squat pivot transfer mat>w/c: max A x 2  GAIT: unable  FUNCTIONAL TESTs:   FIST: 34/56    PATIENT EDUCATION: Education details: prognosis, POC, HEP; discussed aquatic therapy and need for land exercises as well for max benefit and for reassessments  Person educated: Patient and Parent Education method: Explanation, Demonstration, Tactile cues, Verbal cues, and Handouts Education comprehension: verbalized understanding   HOME EXERCISE PROGRAM: Access Code: [email or web address] URL: [email or web address] Date:  08/16/2022 Prepared by: [facility] - Outpatient  Rehab -  [facility]  Exercises - Seated Long Arc Quad  - 1 x daily - 5 x weekly - 2 sets - 10 reps - Seated March  - 1 x daily - 5 x weekly - 2 sets - 10 reps - Seated Hip Abduction  - 1 x daily - 5 x weekly - 2 sets - 10 reps - Seated Gastroc Stretch with Strap  - 1 x daily - 5 x weekly - 2 sets - 10 reps    GOALS: Goals reviewed with patient? Yes  SHORT TERM GOALS: Target date: 09/06/2022  Patient to be independent with initial HEP. Baseline: HEP initiated Goal status: On-going    LONG TERM GOALS: Target date: 10/11/2022  Patient to be independent with advanced HEP. Baseline: Not yet initiated  Goal status: On-going  Patient to demonstrate B ankle dorsiflexion AROM WFL. Baseline: 0, 9 deg Goal status: on-going  Patient to score atleast 40 on FIST in order to reach [place] in sitting balance.  Baseline: 34 Goal status: on-going  Patient to complete squat pivot or sliding board transfer with mod A in order to reduce caregiver burden and patient's independence..   Baseline: max A x 2 Goal status: on-going  ASSESSMENT:  CLINICAL IMPRESSION: Initiated activities in parallel bars to improve body mechanics for scooting and trunk flexion over BOS to improve body position for advanced over BOS to prepare for slideboard and trials it sit to stand.  Consistent cues for trunk flexion and therapist blocking knees to facilitate extension moment and able to achieve posterior clearance 50% of trials. Pt reports limited activity OOB due to agency caregivers having difficulty transferring him out of bed. Recommend that they reach out to MD to begin process in obtaining a hoyer lift to improve safety with transferring in/out of bed with CNA staff.     OBJECTIVE IMPAIRMENTS Abnormal gait, decreased activity tolerance, decreased balance, decreased coordination, decreased knowledge of use of DME, decreased mobility, difficulty walking,  decreased ROM, decreased strength, increased fascial restrictions, impaired perceived functional ability, increased muscle spasms, impaired flexibility, impaired sensation, impaired tone, impaired UE functional use, improper body mechanics, and postural dysfunction.   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   REHAB POTENTIAL: Good  CLINICAL DECISION MAKING: Evolving/moderate complexity  EVALUATION COMPLEXITY: Moderate  PLAN: PT FREQUENCY: 1-2x/week  PT DURATION: 8 weeks  PLANNED INTERVENTIONS: Therapeutic exercises, Therapeutic activity, Neuromuscular re-education, Balance training, Gait training, Patient/Family education, Self Care, Joint mobilization, Stair training, Vestibular training, Canalith repositioning, Orthotic/Fit training, DME instructions, Aquatic Therapy, Dry Needling, Electrical stimulation, Wheelchair mobility training, Cryotherapy, Moist heat, Taping, Manual therapy, and Re-evaluation  PLAN FOR NEXT SESSION: reassess HEP; progress LE strengthening and stretching, work on squat pivot/sliding board transfers, standing frame?  10:43 AM, 09/11/22 M. Sherlyn Lees, PT, DPT Physical Therapist- [place] Office Number: [phone]   [place] at [facility] [address], [place], [place] [address] Phone # [phone] Fax # [phone]

## 2022-09-12 DIAGNOSIS — G8252 Quadriplegia, C1-C4 incomplete: Secondary | ICD-10-CM | POA: Diagnosis not present

## 2022-09-13 ENCOUNTER — Ambulatory Visit: Payer: Medicaid Other

## 2022-09-13 DIAGNOSIS — G8252 Quadriplegia, C1-C4 incomplete: Secondary | ICD-10-CM | POA: Diagnosis not present

## 2022-09-14 DIAGNOSIS — G8252 Quadriplegia, C1-C4 incomplete: Secondary | ICD-10-CM | POA: Diagnosis not present

## 2022-09-15 DIAGNOSIS — G8252 Quadriplegia, C1-C4 incomplete: Secondary | ICD-10-CM | POA: Diagnosis not present

## 2022-09-16 ENCOUNTER — Ambulatory Visit: Payer: Medicaid Other | Admitting: Physical Therapy

## 2022-09-16 DIAGNOSIS — G8252 Quadriplegia, C1-C4 incomplete: Secondary | ICD-10-CM | POA: Diagnosis not present

## 2022-09-17 DIAGNOSIS — G8252 Quadriplegia, C1-C4 incomplete: Secondary | ICD-10-CM | POA: Diagnosis not present

## 2022-09-18 DIAGNOSIS — G8252 Quadriplegia, C1-C4 incomplete: Secondary | ICD-10-CM | POA: Diagnosis not present

## 2022-09-19 DIAGNOSIS — G8252 Quadriplegia, C1-C4 incomplete: Secondary | ICD-10-CM | POA: Diagnosis not present

## 2022-09-20 ENCOUNTER — Ambulatory Visit: Payer: Medicaid Other

## 2022-09-20 DIAGNOSIS — G8252 Quadriplegia, C1-C4 incomplete: Secondary | ICD-10-CM | POA: Diagnosis not present

## 2022-09-20 DIAGNOSIS — R293 Abnormal posture: Secondary | ICD-10-CM | POA: Diagnosis not present

## 2022-09-20 DIAGNOSIS — R29818 Other symptoms and signs involving the nervous system: Secondary | ICD-10-CM

## 2022-09-20 DIAGNOSIS — R29898 Other symptoms and signs involving the musculoskeletal system: Secondary | ICD-10-CM | POA: Diagnosis not present

## 2022-09-20 DIAGNOSIS — R262 Difficulty in walking, not elsewhere classified: Secondary | ICD-10-CM

## 2022-09-20 DIAGNOSIS — M6281 Muscle weakness (generalized): Secondary | ICD-10-CM

## 2022-09-20 DIAGNOSIS — Z7689 Persons encountering health services in other specified circumstances: Secondary | ICD-10-CM | POA: Diagnosis not present

## 2022-09-20 NOTE — Therapy (Signed)
OUTPATIENT PHYSICAL THERAPY NEURO TREATMENT   Patient Name: Carlos [name] MRN: [id number] DOB:Jan 31, 1966, 56 y.o., male Today's Date: 09/20/2022   PCP: none REFERRING PROVIDER: Tommas Olp, MD   PT End of Session - 09/20/22 0914     Visit Number 4    Number of Visits 17    Date for PT Re-Evaluation 10/11/22    Authorization Type [employer]    Authorization - Visit Number 4    Authorization - Number of Visits 27   combined; already used 4   PT Start Time 0900   late arrival   PT Stop Time 0930    PT Time Calculation (min) 30 min    Equipment Utilized During Treatment Gait belt    Activity Tolerance Patient tolerated treatment well    Behavior During Therapy WFL for tasks assessed/performed             Past Medical History:  Diagnosis Date   Crush injury to hand 12/2018   bilateral hands with multipe fractures   Finger osteomyelitis, left ([place]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/2018   Past Surgical History:  Procedure Laterality Date   I & D EXTREMITY Left 09/23/2019   Procedure: IRRIGATION AND DEBRIDEMENT OF  LEFT LONG FINGER, REVISION OF LEFT LONG FINGER AMPUTATION;  Surgeon: Verner Mould, MD;  Location: [place];  Service: Orthopedics;  Laterality: Left;   partial amputation left 3rd finger Left 12/2018   Patient Active Problem List   Diagnosis Date Noted   Finger osteomyelitis, left ([place]) 09/23/2019   Leukocytosis 09/23/2019    ONSET DATE: 11/23/2020  REFERRING DIAG: 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, unspecified  THERAPY DIAG:  Muscle weakness (generalized)  Other symptoms and signs involving the nervous system  Abnormal posture  Difficulty in walking, not elsewhere classified  Other symptoms and signs involving the musculoskeletal system  Rationale for Evaluation and Treatment Rehabilitation  SUBJECTIVE:                                                                                                                                                                                               SUBJECTIVE STATEMENT: Feeling pretty good   Pt accompanied by: self and family member mother  PERTINENT HISTORY: 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 No  PRECAUTIONS: Fall  WEIGHT BEARING RESTRICTIONS No  FALLS: Has patient fallen in last 6 months? No  LIVING ENVIRONMENT: Lives with:  son and daughter Lives in: House/apartment Stairs:  has a Scientist, water quality ramp" to enter; 1 story home  Has following equipment at home:  manual w/c, power w/c, walker, sliding board  PLOF: Needs assistance with ADLs and Needs assistance with transfers  PATIENT GOALS get into the water  OBJECTIVE:    TODAY'S TREATMENT: 09/20/22 Activity Comments  Slideboard transfer W/c to EOM max A for scooting  scooting Mod-max A for scooting left; max to total for scooting right, facilitation of body position, trunk flexion over BOS and initiating LE push-off  RUE cross body elbow extension 3x10, therapist facilitating elbow extension I order to improve pushing/scooting  EOM to w/c Max A, cues for sequence           DIAGNOSTIC FINDINGS: none recent  COGNITION: Overall cognitive status: difficult to assess   SENSATION: Reports no sensation in either hand/forearm   COORDINATION: Unable on L d/t contracture Alternating pronation/supination: mild dysmetria and spasticity in R fingers Alternating toe tap: intact B Finger to nose: mild dysmetria and spasticity in R fingers   TONE: increased in B ankles; L>R hand spasticity/tone  POSTURE: rounded shoulders and posterior pelvic tilt; L wt shift  OBSERVATION: raised rash over the L LB and midback- mother reports that this is new and did not know it was there   LOWER EXTREMITY ROM:     Active  Right Eval Left Eval  Hip flexion    Hip extension    Hip abduction    Hip adduction    Hip internal rotation    Hip external  rotation    Knee flexion    Knee extension 45 40  Ankle dorsiflexion 0 9  Ankle plantarflexion    Ankle inversion    Ankle eversion     (Blank rows = not tested)  LOWER EXTREMITY MMT:    MMT (in sitting) Right Eval Left Eval  Hip flexion 4 4  Hip extension    Hip abduction 4 4  Hip adduction 4- 3+  Hip internal rotation    Hip external rotation    Knee flexion 4 4-  Knee extension 3+ 3+  Ankle dorsiflexion 4 3+  Ankle plantarflexion 2+ 2+  Ankle inversion    Ankle eversion    (Blank rows = not tested)   TRANSFERS: Assistive device utilized: Wheelchair (manual)  Squat pivot transfer w/c>mat: max A x 2; mod A with scooting and set up of transfer Squat pivot transfer mat>w/c: max A x 2  GAIT: unable  FUNCTIONAL TESTs:   FIST: 34/56    PATIENT EDUCATION: Education details: prognosis, POC, HEP; discussed aquatic therapy and need for land exercises as well for max benefit and for reassessments  Person educated: Patient and Parent Education method: Explanation, Demonstration, Tactile cues, Verbal cues, and Handouts Education comprehension: verbalized understanding   HOME EXERCISE PROGRAM: Access Code: [email or web address] URL: [email or web address] Date: 08/16/2022 Prepared by: [facility]  Exercises - Seated Long Arc Quad  - 1 x daily - 5 x weekly - 2 sets - 10 reps - Seated March  - 1 x daily - 5 x weekly - 2 sets - 10 reps - Seated Hip Abduction  - 1 x daily - 5 x weekly - 2 sets - 10 reps - Seated Gastroc Stretch with Strap  - 1 x daily - 5 x weekly - 2 sets - 10 reps    GOALS: Goals reviewed with patient? Yes  SHORT TERM GOALS: Target date: 09/06/2022  Patient to be independent with initial HEP. Baseline: HEP initiated Goal status: On-going    LONG TERM GOALS: Target date:  10/11/2022  Patient to be independent with advanced HEP. Baseline: Not yet initiated  Goal status: On-going  Patient to demonstrate  B ankle dorsiflexion AROM WFL. Baseline: 0, 9 deg Goal status: on-going  Patient to score atleast 40 on FIST in order to reach [place] in sitting balance.  Baseline: 34 Goal status: on-going  Patient to complete squat pivot or sliding board transfer with mod A in order to reduce caregiver burden and patient's independence..   Baseline: max A x 2 Goal status: on-going  ASSESSMENT:  CLINICAL IMPRESSION: Continued with mobility training to increase independnce with slideboard transfers to hopefully generalize to home staff as pt reports he spends majority of time in bed due to only having one CNA that can assist him from bed to wheelchair. Progressing with activities to enhance mobility requiring frequent tactile  cues to promote body mechanics. Pt has pool session at next appointment. Continued sessions to progress functional mobility to reduce burden of care    OBJECTIVE IMPAIRMENTS Abnormal gait, decreased activity tolerance, decreased balance, decreased coordination, decreased knowledge of use of DME, decreased mobility, difficulty walking, decreased ROM, decreased strength, increased fascial restrictions, impaired perceived functional ability, increased muscle spasms, impaired flexibility, impaired sensation, impaired tone, impaired UE functional use, improper body mechanics, and postural dysfunction.   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   REHAB POTENTIAL: Good  CLINICAL DECISION MAKING: Evolving/moderate complexity  EVALUATION COMPLEXITY: Moderate  PLAN: PT FREQUENCY: 1-2x/week  PT DURATION: 8 weeks  PLANNED INTERVENTIONS: Therapeutic exercises, Therapeutic activity, Neuromuscular re-education, Balance training, Gait training, Patient/Family education, Self Care, Joint mobilization, Stair training, Vestibular training, Canalith repositioning, Orthotic/Fit training, DME instructions, Aquatic Therapy, Dry Needling, Electrical stimulation, Wheelchair mobility training, Cryotherapy, Moist heat, Taping, Manual therapy, and Re-evaluation  PLAN FOR NEXT SESSION: reassess HEP; progress LE strengthening and stretching, work on squat pivot/sliding board transfers, standing frame?  9:15 AM, 09/20/22 M. Sherlyn Lees, PT, DPT Physical Therapist- [place] Office Number: [phone]   [place] at [facility] [address], [place], [place] [address] Phone # [phone] Fax # [phone]

## 2022-09-22 DIAGNOSIS — G8252 Quadriplegia, C1-C4 incomplete: Secondary | ICD-10-CM | POA: Diagnosis not present

## 2022-09-23 ENCOUNTER — Ambulatory Visit: Payer: Self-pay | Admitting: Physical Therapy

## 2022-09-23 DIAGNOSIS — G8252 Quadriplegia, C1-C4 incomplete: Secondary | ICD-10-CM | POA: Diagnosis not present

## 2022-09-24 DIAGNOSIS — G8252 Quadriplegia, C1-C4 incomplete: Secondary | ICD-10-CM | POA: Diagnosis not present

## 2022-09-25 ENCOUNTER — Ambulatory Visit: Payer: Medicaid Other

## 2022-09-25 DIAGNOSIS — G8252 Quadriplegia, C1-C4 incomplete: Secondary | ICD-10-CM | POA: Diagnosis not present

## 2022-09-26 DIAGNOSIS — G8252 Quadriplegia, C1-C4 incomplete: Secondary | ICD-10-CM | POA: Diagnosis not present

## 2022-09-27 DIAGNOSIS — G8252 Quadriplegia, C1-C4 incomplete: Secondary | ICD-10-CM | POA: Diagnosis not present

## 2022-09-28 DIAGNOSIS — G8252 Quadriplegia, C1-C4 incomplete: Secondary | ICD-10-CM | POA: Diagnosis not present

## 2022-09-29 DIAGNOSIS — G8252 Quadriplegia, C1-C4 incomplete: Secondary | ICD-10-CM | POA: Diagnosis not present

## 2022-09-30 DIAGNOSIS — G8252 Quadriplegia, C1-C4 incomplete: Secondary | ICD-10-CM | POA: Diagnosis not present

## 2022-10-01 DIAGNOSIS — G8252 Quadriplegia, C1-C4 incomplete: Secondary | ICD-10-CM | POA: Diagnosis not present

## 2022-10-02 ENCOUNTER — Ambulatory Visit: Payer: Medicaid Other | Admitting: Physical Therapy

## 2022-10-02 DIAGNOSIS — G8252 Quadriplegia, C1-C4 incomplete: Secondary | ICD-10-CM | POA: Diagnosis not present

## 2022-10-03 DIAGNOSIS — G8252 Quadriplegia, C1-C4 incomplete: Secondary | ICD-10-CM | POA: Diagnosis not present

## 2022-10-04 ENCOUNTER — Ambulatory Visit: Payer: Medicaid Other

## 2022-10-04 DIAGNOSIS — Z419 Encounter for procedure for purposes other than remedying health state, unspecified: Secondary | ICD-10-CM | POA: Diagnosis not present

## 2022-10-04 DIAGNOSIS — G8252 Quadriplegia, C1-C4 incomplete: Secondary | ICD-10-CM | POA: Diagnosis not present

## 2022-10-05 DIAGNOSIS — G8252 Quadriplegia, C1-C4 incomplete: Secondary | ICD-10-CM | POA: Diagnosis not present

## 2022-10-06 DIAGNOSIS — G8252 Quadriplegia, C1-C4 incomplete: Secondary | ICD-10-CM | POA: Diagnosis not present

## 2022-10-07 DIAGNOSIS — G8252 Quadriplegia, C1-C4 incomplete: Secondary | ICD-10-CM | POA: Diagnosis not present

## 2022-10-08 DIAGNOSIS — G8252 Quadriplegia, C1-C4 incomplete: Secondary | ICD-10-CM | POA: Diagnosis not present

## 2022-10-09 ENCOUNTER — Ambulatory Visit: Payer: Medicaid Other | Attending: Neurology

## 2022-10-09 DIAGNOSIS — G8252 Quadriplegia, C1-C4 incomplete: Secondary | ICD-10-CM | POA: Diagnosis not present

## 2022-10-09 DIAGNOSIS — R29818 Other symptoms and signs involving the nervous system: Secondary | ICD-10-CM | POA: Insufficient documentation

## 2022-10-09 DIAGNOSIS — R29898 Other symptoms and signs involving the musculoskeletal system: Secondary | ICD-10-CM | POA: Diagnosis not present

## 2022-10-09 DIAGNOSIS — M6281 Muscle weakness (generalized): Secondary | ICD-10-CM | POA: Diagnosis not present

## 2022-10-09 DIAGNOSIS — R262 Difficulty in walking, not elsewhere classified: Secondary | ICD-10-CM | POA: Diagnosis not present

## 2022-10-09 DIAGNOSIS — R293 Abnormal posture: Secondary | ICD-10-CM | POA: Insufficient documentation

## 2022-10-09 NOTE — Therapy (Signed)
OUTPATIENT PHYSICAL THERAPY NEURO TREATMENT and D/C Summary   Patient Name: Carlos [name] MRN: [id number] DOB:10-Feb-1966, 56 y.o., male Today's Date: 10/09/2022   PCP: none REFERRING PROVIDER: Tommas Olp, MD  PHYSICAL THERAPY DISCHARGE SUMMARY  Visits from Start of Care: 5  Current functional level related to goals / functional outcomes: Partially met   Remaining deficits: LE strength, contractures, mobility deficits   Education / Equipment: HEP initiated   Patient agrees to discharge. Patient goals were partially met. Patient is being discharged due to  discussed .    PT End of Session - 10/09/22 0844     Visit Number 5    Number of Visits 17    Date for PT Re-Evaluation 10/11/22    Authorization Type [employer]    Authorization - Visit Number 5    Authorization - Number of Visits 27   combined; already used 4   PT Start Time 0845    PT Stop Time 0930    PT Time Calculation (min) 45 min    Equipment Utilized During Treatment Gait belt    Activity Tolerance Patient tolerated treatment well    Behavior During Therapy WFL for tasks assessed/performed             Past Medical History:  Diagnosis Date   Crush injury to hand 12/2018   bilateral hands with multipe fractures   Finger osteomyelitis, left ([place]) 12/2018   Past Surgical History:  Procedure Laterality Date   I & D EXTREMITY Left 09/23/2019   Procedure: IRRIGATION AND DEBRIDEMENT OF  LEFT LONG FINGER, REVISION OF LEFT LONG FINGER AMPUTATION;  Surgeon: Verner Mould, MD;  Location: [facility];  Service: Orthopedics;  Laterality: Left;   partial amputation left 3rd finger Left 12/2018   Patient Active Problem List   Diagnosis Date Noted   Finger osteomyelitis, left ([place]) 09/23/2019   Leukocytosis 09/23/2019    ONSET DATE: 11/23/2020  REFERRING DIAG: 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 Had to rush to get ready for transport. 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 Having some jolts of LBP today  PRECAUTIONS: Fall  WEIGHT BEARING RESTRICTIONS No  FALLS: Has patient fallen in last 6 months? No  LIVING ENVIRONMENT: Lives with:  son and daughter Lives in: House/apartment Stairs:  has a Scientist, water quality ramp" to enter; 1 story home  Has following equipment at home:  manual w/c, power w/c, walker, sliding board  PLOF: Needs assistance with ADLs and Needs assistance with transfers  PATIENT GOALS get into the water  OBJECTIVE:   TODAY'S TREATMENT: 10/09/22 Activity Comments  Slideboard transfer Total assist, cues in RUE placement but unable to facilitate push-off  Function In Sitting Test 40/56  HEP review Non-compliant, reports no assist to sit side of bed  Pt and family education -discussed benefits of home health services and equipment recommendations           TODAY'S TREATMENT: 09/20/22 Activity Comments  Slideboard transfer W/c to EOM max A for scooting  scooting Mod-max A for scooting left; max to total for scooting right, facilitation of body position, trunk flexion over BOS and initiating LE push-off  RUE cross body elbow extension 3x10, therapist facilitating  elbow extension I order to improve pushing/scooting  EOM to w/c Max A, cues for sequence           DIAGNOSTIC FINDINGS: none recent  COGNITION: Overall cognitive status: difficult to assess   SENSATION: Reports no sensation in either hand/forearm   COORDINATION: Unable on L d/t contracture Alternating pronation/supination: mild dysmetria and spasticity in R fingers Alternating toe tap: intact B Finger to nose: mild dysmetria and spasticity in R fingers   TONE: increased in B ankles; L>R hand spasticity/tone  POSTURE: rounded shoulders and posterior pelvic tilt; L wt shift  OBSERVATION: raised rash over the L LB and midback- mother reports that this is new and did not know it was there   LOWER EXTREMITY ROM:     Active  Right Eval Left Eval  Hip flexion    Hip extension    Hip abduction    Hip adduction    Hip internal rotation    Hip external rotation    Knee flexion    Knee extension 45 40  Ankle dorsiflexion 0 9  Ankle plantarflexion    Ankle inversion    Ankle eversion     (Blank rows = not tested)  LOWER EXTREMITY MMT:    MMT (in sitting) Right Eval Left Eval  Hip flexion 4 4  Hip extension    Hip abduction 4 4  Hip adduction 4- 3+  Hip internal rotation    Hip external rotation    Knee flexion 4 4-  Knee extension 3+ 3+  Ankle dorsiflexion 4 3+  Ankle plantarflexion 2+ 2+  Ankle inversion    Ankle eversion    (Blank rows = not tested)   TRANSFERS: Assistive device utilized: Wheelchair (manual)  Squat pivot transfer w/c>mat: max A x 2; mod A with scooting and set up of transfer Squat pivot transfer mat>w/c: max A x 2  GAIT: unable  FUNCTIONAL TESTs:   FIST: 34/56    PATIENT EDUCATION: Education details: prognosis, POC, HEP; discussed aquatic therapy and need for land exercises as well for max benefit and for reassessments  Person educated: Patient and Parent Education method: Explanation,  Demonstration, Tactile cues, Verbal cues,  and Handouts Education comprehension: verbalized understanding   HOME EXERCISE PROGRAM: Access Code: [email or web address] URL: [email or web address] Date: 08/16/2022 Prepared by: [facility]  Exercises - Seated Long Arc Quad  - 1 x daily - 5 x weekly - 2 sets - 10 reps - Seated March  - 1 x daily - 5 x weekly - 2 sets - 10 reps - Seated Hip Abduction  - 1 x daily - 5 x weekly - 2 sets - 10 reps - Seated Gastroc Stretch with Strap  - 1 x daily - 5 x weekly - 2 sets - 10 reps    GOALS: Goals reviewed with patient? Yes  SHORT TERM GOALS: Target date: 09/06/2022  Patient to be independent with initial HEP. Baseline: HEP initiated Goal status: NOT MET    LONG TERM GOALS: Target date: 10/11/2022  Patient to be independent with advanced HEP. Baseline: Not yet initiated  Goal status: NOT MET  Patient to demonstrate B ankle dorsiflexion AROM WFL. Baseline: 0, 9 deg; 12-15 degrees sitting EOM Goal status: MET  Patient to score atleast 40 on FIST in order to reach [place] in sitting balance.  Baseline: 34; (10/09/22) 40/56 Goal status: MET  Patient to complete squat pivot or sliding board transfer with mod A in order to reduce caregiver burden and patient's independence..   Baseline: max A x 2 Goal status: NOT MET  ASSESSMENT:  CLINICAL IMPRESSION: Pt returns to clinic and reports limited OOB activity and in fact has been bed bound since last PT session (09/20/22) due to lack of assist at home.  Performed Function In Sitting Test following dependent slide board transfer and able to demo improved score from 34/56 to 40/56 FIST and improved bilateral ankle DF AROM.  Had an at length discussion with patient and his mother regarding benefits of home health PT/OT services at this time vs outpatient given his lack of OOB activity at home and the tremendous burden of leaving home and using medical transport for outpatient visits.  Given that patient  requires total assist or max A x 2 for slide board transfers from EOB<>W/C he would certainly benefit from in-home interventions on a more frequent basis to improve carryover of mobility and hopefully provide training for his family/CNA staff to assist in safe transfers to afford increased activity OOB.  Patient is quite tall and has been using a standard size manual w/c due to report of inability to safely operate his power chair.  Due to his body dimensions he would improve his positioning, posture, and pressure relief with a high-back reclining w/c and pressure relieving cushion which would hopefully promote increased time OOB as well as promote improved self-directed mobility. Recommend D/C from outpatient services and follow up with Home Health PT/OT services to address deficits and limitations    OBJECTIVE IMPAIRMENTS Abnormal gait, decreased activity tolerance, decreased balance, decreased coordination, decreased knowledge of use of DME, decreased mobility, difficulty walking, decreased ROM, decreased strength, increased fascial restrictions, impaired perceived functional ability, increased muscle spasms, impaired flexibility, impaired sensation, impaired tone, impaired UE functional use, improper body mechanics, and postural dysfunction.   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   REHAB POTENTIAL: Good  CLINICAL DECISION MAKING: Evolving/moderate complexity  EVALUATION COMPLEXITY: Moderate  PLAN: PT FREQUENCY: 1-2x/week  PT DURATION: 8 weeks  PLANNED INTERVENTIONS: Therapeutic exercises, Therapeutic activity, Neuromuscular re-education, Balance training, Gait training, Patient/Family education, Self Care, Joint mobilization, Stair training, Vestibular training, Canalith repositioning, Orthotic/Fit training, DME instructions, Aquatic Therapy, Dry Needling, Electrical stimulation, Wheelchair mobility training, Cryotherapy, Moist heat, Taping, Manual therapy, and Re-evaluation  PLAN FOR NEXT SESSION: reassess HEP; progress LE strengthening and stretching, work on squat pivot/sliding board transfers, standing frame?  8:45 AM, 10/09/22 M. Sherlyn Lees, PT, DPT Physical Therapist- [place] Office Number: [phone]   [facility] [address] [place], [place] [address] Phone # [phone] Fax # [phone]

## 2022-10-10 DIAGNOSIS — G8252 Quadriplegia, C1-C4 incomplete: Secondary | ICD-10-CM | POA: Diagnosis not present

## 2022-10-11 ENCOUNTER — Ambulatory Visit: Payer: Medicaid Other | Admitting: Physical Therapy

## 2022-10-11 DIAGNOSIS — G8252 Quadriplegia, C1-C4 incomplete: Secondary | ICD-10-CM | POA: Diagnosis not present

## 2022-10-12 DIAGNOSIS — G8252 Quadriplegia, C1-C4 incomplete: Secondary | ICD-10-CM | POA: Diagnosis not present

## 2022-10-13 DIAGNOSIS — G8252 Quadriplegia, C1-C4 incomplete: Secondary | ICD-10-CM | POA: Diagnosis not present

## 2022-10-14 DIAGNOSIS — G8252 Quadriplegia, C1-C4 incomplete: Secondary | ICD-10-CM | POA: Diagnosis not present

## 2022-10-15 DIAGNOSIS — G8252 Quadriplegia, C1-C4 incomplete: Secondary | ICD-10-CM | POA: Diagnosis not present

## 2022-10-16 DIAGNOSIS — G8252 Quadriplegia, C1-C4 incomplete: Secondary | ICD-10-CM | POA: Diagnosis not present

## 2022-10-16 DIAGNOSIS — Z1211 Encounter for screening for malignant neoplasm of colon: Secondary | ICD-10-CM | POA: Diagnosis not present

## 2022-10-16 DIAGNOSIS — C720 Malignant neoplasm of spinal cord: Secondary | ICD-10-CM | POA: Diagnosis not present

## 2022-10-17 DIAGNOSIS — G8252 Quadriplegia, C1-C4 incomplete: Secondary | ICD-10-CM | POA: Diagnosis not present

## 2022-10-18 DIAGNOSIS — G8252 Quadriplegia, C1-C4 incomplete: Secondary | ICD-10-CM | POA: Diagnosis not present

## 2022-10-19 DIAGNOSIS — G8252 Quadriplegia, C1-C4 incomplete: Secondary | ICD-10-CM | POA: Diagnosis not present

## 2022-10-20 DIAGNOSIS — G8252 Quadriplegia, C1-C4 incomplete: Secondary | ICD-10-CM | POA: Diagnosis not present

## 2022-10-21 DIAGNOSIS — G8252 Quadriplegia, C1-C4 incomplete: Secondary | ICD-10-CM | POA: Diagnosis not present

## 2022-10-22 DIAGNOSIS — G8252 Quadriplegia, C1-C4 incomplete: Secondary | ICD-10-CM | POA: Diagnosis not present

## 2022-10-23 DIAGNOSIS — G8252 Quadriplegia, C1-C4 incomplete: Secondary | ICD-10-CM | POA: Diagnosis not present

## 2022-10-24 DIAGNOSIS — G8252 Quadriplegia, C1-C4 incomplete: Secondary | ICD-10-CM | POA: Diagnosis not present

## 2022-10-25 DIAGNOSIS — G8252 Quadriplegia, C1-C4 incomplete: Secondary | ICD-10-CM | POA: Diagnosis not present

## 2022-10-26 DIAGNOSIS — G8252 Quadriplegia, C1-C4 incomplete: Secondary | ICD-10-CM | POA: Diagnosis not present

## 2022-10-27 DIAGNOSIS — G8252 Quadriplegia, C1-C4 incomplete: Secondary | ICD-10-CM | POA: Diagnosis not present

## 2022-10-28 DIAGNOSIS — G8252 Quadriplegia, C1-C4 incomplete: Secondary | ICD-10-CM | POA: Diagnosis not present

## 2022-10-29 DIAGNOSIS — G8252 Quadriplegia, C1-C4 incomplete: Secondary | ICD-10-CM | POA: Diagnosis not present

## 2022-10-30 DIAGNOSIS — G8252 Quadriplegia, C1-C4 incomplete: Secondary | ICD-10-CM | POA: Diagnosis not present

## 2022-10-31 DIAGNOSIS — G8252 Quadriplegia, C1-C4 incomplete: Secondary | ICD-10-CM | POA: Diagnosis not present

## 2022-11-01 DIAGNOSIS — G8252 Quadriplegia, C1-C4 incomplete: Secondary | ICD-10-CM | POA: Diagnosis not present

## 2022-11-02 DIAGNOSIS — G8252 Quadriplegia, C1-C4 incomplete: Secondary | ICD-10-CM | POA: Diagnosis not present

## 2022-11-03 DIAGNOSIS — G8252 Quadriplegia, C1-C4 incomplete: Secondary | ICD-10-CM | POA: Diagnosis not present

## 2022-11-04 DIAGNOSIS — G8252 Quadriplegia, C1-C4 incomplete: Secondary | ICD-10-CM | POA: Diagnosis not present

## 2022-11-04 DIAGNOSIS — Z419 Encounter for procedure for purposes other than remedying health state, unspecified: Secondary | ICD-10-CM | POA: Diagnosis not present

## 2022-11-05 DIAGNOSIS — G8252 Quadriplegia, C1-C4 incomplete: Secondary | ICD-10-CM | POA: Diagnosis not present

## 2022-11-06 DIAGNOSIS — G8252 Quadriplegia, C1-C4 incomplete: Secondary | ICD-10-CM | POA: Diagnosis not present

## 2022-11-07 DIAGNOSIS — G8252 Quadriplegia, C1-C4 incomplete: Secondary | ICD-10-CM | POA: Diagnosis not present

## 2022-11-08 DIAGNOSIS — D497 Neoplasm of unspecified behavior of endocrine glands and other parts of nervous system: Secondary | ICD-10-CM | POA: Diagnosis not present

## 2022-11-08 DIAGNOSIS — G8252 Quadriplegia, C1-C4 incomplete: Secondary | ICD-10-CM | POA: Diagnosis not present

## 2022-11-09 DIAGNOSIS — G8252 Quadriplegia, C1-C4 incomplete: Secondary | ICD-10-CM | POA: Diagnosis not present

## 2022-11-10 DIAGNOSIS — G8252 Quadriplegia, C1-C4 incomplete: Secondary | ICD-10-CM | POA: Diagnosis not present

## 2022-11-11 DIAGNOSIS — G8252 Quadriplegia, C1-C4 incomplete: Secondary | ICD-10-CM | POA: Diagnosis not present

## 2022-11-12 DIAGNOSIS — G8252 Quadriplegia, C1-C4 incomplete: Secondary | ICD-10-CM | POA: Diagnosis not present

## 2022-11-13 DIAGNOSIS — G8252 Quadriplegia, C1-C4 incomplete: Secondary | ICD-10-CM | POA: Diagnosis not present

## 2022-11-14 DIAGNOSIS — G8252 Quadriplegia, C1-C4 incomplete: Secondary | ICD-10-CM | POA: Diagnosis not present

## 2022-11-15 DIAGNOSIS — G8252 Quadriplegia, C1-C4 incomplete: Secondary | ICD-10-CM | POA: Diagnosis not present

## 2022-11-16 DIAGNOSIS — G8252 Quadriplegia, C1-C4 incomplete: Secondary | ICD-10-CM | POA: Diagnosis not present

## 2022-11-17 DIAGNOSIS — G8252 Quadriplegia, C1-C4 incomplete: Secondary | ICD-10-CM | POA: Diagnosis not present

## 2022-11-18 DIAGNOSIS — G8252 Quadriplegia, C1-C4 incomplete: Secondary | ICD-10-CM | POA: Diagnosis not present

## 2022-11-19 DIAGNOSIS — G8252 Quadriplegia, C1-C4 incomplete: Secondary | ICD-10-CM | POA: Diagnosis not present

## 2022-11-19 NOTE — Therapy (Signed)
[facility] [address] [place], [place], [address] Phone: [phone]   Fax:  [phone]  Patient Details  Name: Ayaz Sondgeroth MRN: [id number] Date of Birth: 10/07/1966 Referring Provider:  No ref. provider found  Encounter Date: 11/19/2022 PHYSICAL THERAPY DISCHARGE SUMMARY  See previous document for details  Toniann Fail, PT 11/19/2022, 9:09 AM  [facility] [address] [place], [place], [address] Phone: [phone]   Fax:  [phone]

## 2022-11-20 DIAGNOSIS — G8252 Quadriplegia, C1-C4 incomplete: Secondary | ICD-10-CM | POA: Diagnosis not present

## 2022-11-21 DIAGNOSIS — G8252 Quadriplegia, C1-C4 incomplete: Secondary | ICD-10-CM | POA: Diagnosis not present

## 2022-11-22 DIAGNOSIS — G8252 Quadriplegia, C1-C4 incomplete: Secondary | ICD-10-CM | POA: Diagnosis not present

## 2022-11-23 DIAGNOSIS — G8252 Quadriplegia, C1-C4 incomplete: Secondary | ICD-10-CM | POA: Diagnosis not present

## 2022-11-24 DIAGNOSIS — G8252 Quadriplegia, C1-C4 incomplete: Secondary | ICD-10-CM | POA: Diagnosis not present

## 2022-11-25 DIAGNOSIS — G8252 Quadriplegia, C1-C4 incomplete: Secondary | ICD-10-CM | POA: Diagnosis not present

## 2022-11-26 DIAGNOSIS — G8252 Quadriplegia, C1-C4 incomplete: Secondary | ICD-10-CM | POA: Diagnosis not present

## 2022-11-27 DIAGNOSIS — G8252 Quadriplegia, C1-C4 incomplete: Secondary | ICD-10-CM | POA: Diagnosis not present

## 2022-11-28 DIAGNOSIS — Z1211 Encounter for screening for malignant neoplasm of colon: Secondary | ICD-10-CM | POA: Diagnosis not present

## 2022-11-28 DIAGNOSIS — G8252 Quadriplegia, C1-C4 incomplete: Secondary | ICD-10-CM | POA: Diagnosis not present

## 2022-11-29 DIAGNOSIS — G8252 Quadriplegia, C1-C4 incomplete: Secondary | ICD-10-CM | POA: Diagnosis not present

## 2022-11-30 DIAGNOSIS — G8252 Quadriplegia, C1-C4 incomplete: Secondary | ICD-10-CM | POA: Diagnosis not present

## 2022-12-01 DIAGNOSIS — G8252 Quadriplegia, C1-C4 incomplete: Secondary | ICD-10-CM | POA: Diagnosis not present

## 2022-12-02 DIAGNOSIS — G8252 Quadriplegia, C1-C4 incomplete: Secondary | ICD-10-CM | POA: Diagnosis not present

## 2022-12-03 DIAGNOSIS — G8252 Quadriplegia, C1-C4 incomplete: Secondary | ICD-10-CM | POA: Diagnosis not present

## 2022-12-04 DIAGNOSIS — G8252 Quadriplegia, C1-C4 incomplete: Secondary | ICD-10-CM | POA: Diagnosis not present

## 2022-12-05 DIAGNOSIS — Z419 Encounter for procedure for purposes other than remedying health state, unspecified: Secondary | ICD-10-CM | POA: Diagnosis not present

## 2022-12-05 DIAGNOSIS — G8252 Quadriplegia, C1-C4 incomplete: Secondary | ICD-10-CM | POA: Diagnosis not present

## 2022-12-06 DIAGNOSIS — G8252 Quadriplegia, C1-C4 incomplete: Secondary | ICD-10-CM | POA: Diagnosis not present

## 2022-12-07 DIAGNOSIS — G8252 Quadriplegia, C1-C4 incomplete: Secondary | ICD-10-CM | POA: Diagnosis not present

## 2022-12-08 DIAGNOSIS — G8252 Quadriplegia, C1-C4 incomplete: Secondary | ICD-10-CM | POA: Diagnosis not present

## 2022-12-09 DIAGNOSIS — G8252 Quadriplegia, C1-C4 incomplete: Secondary | ICD-10-CM | POA: Diagnosis not present

## 2022-12-10 DIAGNOSIS — G8252 Quadriplegia, C1-C4 incomplete: Secondary | ICD-10-CM | POA: Diagnosis not present

## 2022-12-11 DIAGNOSIS — G8252 Quadriplegia, C1-C4 incomplete: Secondary | ICD-10-CM | POA: Diagnosis not present

## 2022-12-12 DIAGNOSIS — G8252 Quadriplegia, C1-C4 incomplete: Secondary | ICD-10-CM | POA: Diagnosis not present

## 2022-12-13 DIAGNOSIS — G8252 Quadriplegia, C1-C4 incomplete: Secondary | ICD-10-CM | POA: Diagnosis not present

## 2022-12-14 DIAGNOSIS — G8252 Quadriplegia, C1-C4 incomplete: Secondary | ICD-10-CM | POA: Diagnosis not present

## 2022-12-15 DIAGNOSIS — G8252 Quadriplegia, C1-C4 incomplete: Secondary | ICD-10-CM | POA: Diagnosis not present

## 2022-12-15 LAB — COLOGUARD: COLOGUARD: NEGATIVE

## 2022-12-15 LAB — EXTERNAL GENERIC LAB PROCEDURE: COLOGUARD: NEGATIVE

## 2022-12-16 DIAGNOSIS — G8252 Quadriplegia, C1-C4 incomplete: Secondary | ICD-10-CM | POA: Diagnosis not present

## 2022-12-17 DIAGNOSIS — G8252 Quadriplegia, C1-C4 incomplete: Secondary | ICD-10-CM | POA: Diagnosis not present

## 2022-12-18 DIAGNOSIS — G8252 Quadriplegia, C1-C4 incomplete: Secondary | ICD-10-CM | POA: Diagnosis not present

## 2022-12-19 DIAGNOSIS — G8252 Quadriplegia, C1-C4 incomplete: Secondary | ICD-10-CM | POA: Diagnosis not present

## 2022-12-20 DIAGNOSIS — G8252 Quadriplegia, C1-C4 incomplete: Secondary | ICD-10-CM | POA: Diagnosis not present

## 2022-12-21 DIAGNOSIS — G8252 Quadriplegia, C1-C4 incomplete: Secondary | ICD-10-CM | POA: Diagnosis not present

## 2022-12-22 DIAGNOSIS — G8252 Quadriplegia, C1-C4 incomplete: Secondary | ICD-10-CM | POA: Diagnosis not present

## 2022-12-23 DIAGNOSIS — G8252 Quadriplegia, C1-C4 incomplete: Secondary | ICD-10-CM | POA: Diagnosis not present

## 2022-12-24 DIAGNOSIS — G8252 Quadriplegia, C1-C4 incomplete: Secondary | ICD-10-CM | POA: Diagnosis not present

## 2022-12-25 DIAGNOSIS — G8252 Quadriplegia, C1-C4 incomplete: Secondary | ICD-10-CM | POA: Diagnosis not present

## 2022-12-26 DIAGNOSIS — G8252 Quadriplegia, C1-C4 incomplete: Secondary | ICD-10-CM | POA: Diagnosis not present

## 2022-12-27 DIAGNOSIS — G8252 Quadriplegia, C1-C4 incomplete: Secondary | ICD-10-CM | POA: Diagnosis not present

## 2022-12-28 ENCOUNTER — Inpatient Hospital Stay (HOSPITAL_COMMUNITY)
Admission: EM | Admit: 2022-12-28 | Discharge: 2023-01-12 | DRG: 388 | Disposition: A | Discharge status: Home Health | Payer: Medicaid Other | Attending: Internal Medicine | Admitting: Internal Medicine

## 2022-12-28 ENCOUNTER — Encounter (HOSPITAL_COMMUNITY): Payer: Self-pay

## 2022-12-28 ENCOUNTER — Other Ambulatory Visit: Payer: Self-pay

## 2022-12-28 DIAGNOSIS — Z7401 Bed confinement status: Secondary | ICD-10-CM

## 2022-12-28 DIAGNOSIS — K567 Ileus, unspecified: Secondary | ICD-10-CM | POA: Diagnosis present

## 2022-12-28 DIAGNOSIS — K219 Gastro-esophageal reflux disease without esophagitis: Secondary | ICD-10-CM | POA: Diagnosis present

## 2022-12-28 DIAGNOSIS — R9431 Abnormal electrocardiogram [ECG] [EKG]: Secondary | ICD-10-CM | POA: Diagnosis not present

## 2022-12-28 DIAGNOSIS — Z6824 Body mass index (BMI) 24.0-24.9, adult: Secondary | ICD-10-CM

## 2022-12-28 DIAGNOSIS — Z79899 Other long term (current) drug therapy: Secondary | ICD-10-CM

## 2022-12-28 DIAGNOSIS — R532 Functional quadriplegia: Secondary | ICD-10-CM | POA: Diagnosis present

## 2022-12-28 DIAGNOSIS — R6510 Systemic inflammatory response syndrome (SIRS) of non-infectious origin without acute organ dysfunction: Secondary | ICD-10-CM | POA: Diagnosis present

## 2022-12-28 DIAGNOSIS — G4700 Insomnia, unspecified: Secondary | ICD-10-CM | POA: Diagnosis not present

## 2022-12-28 DIAGNOSIS — Z9889 Other specified postprocedural states: Secondary | ICD-10-CM

## 2022-12-28 DIAGNOSIS — E876 Hypokalemia: Secondary | ICD-10-CM | POA: Diagnosis present

## 2022-12-28 DIAGNOSIS — M24529 Contracture, unspecified elbow: Secondary | ICD-10-CM | POA: Insufficient documentation

## 2022-12-28 DIAGNOSIS — Z85848 Personal history of malignant neoplasm of other parts of nervous tissue: Secondary | ICD-10-CM

## 2022-12-28 DIAGNOSIS — Z89022 Acquired absence of left finger(s): Secondary | ICD-10-CM

## 2022-12-28 DIAGNOSIS — Z743 Need for continuous supervision: Secondary | ICD-10-CM | POA: Diagnosis not present

## 2022-12-28 DIAGNOSIS — E43 Unspecified severe protein-calorie malnutrition: Secondary | ICD-10-CM | POA: Diagnosis present

## 2022-12-28 DIAGNOSIS — E871 Hypo-osmolality and hyponatremia: Secondary | ICD-10-CM | POA: Diagnosis present

## 2022-12-28 DIAGNOSIS — K56609 Unspecified intestinal obstruction, unspecified as to partial versus complete obstruction: Secondary | ICD-10-CM | POA: Diagnosis not present

## 2022-12-28 DIAGNOSIS — D72829 Elevated white blood cell count, unspecified: Secondary | ICD-10-CM | POA: Diagnosis present

## 2022-12-28 DIAGNOSIS — M24549 Contracture, unspecified hand: Secondary | ICD-10-CM | POA: Insufficient documentation

## 2022-12-28 DIAGNOSIS — C720 Malignant neoplasm of spinal cord: Secondary | ICD-10-CM | POA: Diagnosis present

## 2022-12-28 DIAGNOSIS — R7881 Bacteremia: Secondary | ICD-10-CM | POA: Diagnosis present

## 2022-12-28 DIAGNOSIS — Z881 Allergy status to other antibiotic agents status: Secondary | ICD-10-CM

## 2022-12-28 DIAGNOSIS — L409 Psoriasis, unspecified: Secondary | ICD-10-CM | POA: Diagnosis present

## 2022-12-28 DIAGNOSIS — R1909 Other intra-abdominal and pelvic swelling, mass and lump: Secondary | ICD-10-CM | POA: Diagnosis not present

## 2022-12-28 DIAGNOSIS — M24541 Contracture, right hand: Secondary | ICD-10-CM | POA: Diagnosis present

## 2022-12-28 DIAGNOSIS — U071 COVID-19: Secondary | ICD-10-CM | POA: Insufficient documentation

## 2022-12-28 DIAGNOSIS — K5989 Other specified functional intestinal disorders: Secondary | ICD-10-CM | POA: Diagnosis present

## 2022-12-28 DIAGNOSIS — R066 Hiccough: Secondary | ICD-10-CM | POA: Diagnosis not present

## 2022-12-28 DIAGNOSIS — G8252 Quadriplegia, C1-C4 incomplete: Secondary | ICD-10-CM | POA: Diagnosis not present

## 2022-12-28 NOTE — ED Triage Notes (Signed)
Patient brought in by EMS, reports abd tenderness and no BM in 5 days, stomach distended and rigid. Per mother they have tried multiple meds with no relief or BM. Patient is nonambulatory, has hx of this and contractures.

## 2022-12-29 ENCOUNTER — Encounter (HOSPITAL_COMMUNITY): Payer: Self-pay

## 2022-12-29 ENCOUNTER — Emergency Department (HOSPITAL_COMMUNITY): Payer: Medicaid Other

## 2022-12-29 ENCOUNTER — Inpatient Hospital Stay (HOSPITAL_COMMUNITY): Payer: Medicaid Other

## 2022-12-29 DIAGNOSIS — R532 Functional quadriplegia: Secondary | ICD-10-CM | POA: Diagnosis not present

## 2022-12-29 DIAGNOSIS — Z419 Encounter for procedure for purposes other than remedying health state, unspecified: Secondary | ICD-10-CM | POA: Diagnosis not present

## 2022-12-29 DIAGNOSIS — Z7401 Bed confinement status: Secondary | ICD-10-CM | POA: Diagnosis not present

## 2022-12-29 DIAGNOSIS — K3189 Other diseases of stomach and duodenum: Secondary | ICD-10-CM | POA: Diagnosis not present

## 2022-12-29 DIAGNOSIS — Z85848 Personal history of malignant neoplasm of other parts of nervous tissue: Secondary | ICD-10-CM | POA: Diagnosis not present

## 2022-12-29 DIAGNOSIS — Z79899 Other long term (current) drug therapy: Secondary | ICD-10-CM | POA: Diagnosis not present

## 2022-12-29 DIAGNOSIS — M24541 Contracture, right hand: Secondary | ICD-10-CM | POA: Diagnosis not present

## 2022-12-29 DIAGNOSIS — Z881 Allergy status to other antibiotic agents status: Secondary | ICD-10-CM | POA: Diagnosis not present

## 2022-12-29 DIAGNOSIS — K219 Gastro-esophageal reflux disease without esophagitis: Secondary | ICD-10-CM | POA: Diagnosis present

## 2022-12-29 DIAGNOSIS — Z7689 Persons encountering health services in other specified circumstances: Secondary | ICD-10-CM | POA: Diagnosis not present

## 2022-12-29 DIAGNOSIS — Z6824 Body mass index (BMI) 24.0-24.9, adult: Secondary | ICD-10-CM | POA: Diagnosis not present

## 2022-12-29 DIAGNOSIS — C720 Malignant neoplasm of spinal cord: Secondary | ICD-10-CM | POA: Diagnosis not present

## 2022-12-29 DIAGNOSIS — L409 Psoriasis, unspecified: Secondary | ICD-10-CM | POA: Diagnosis not present

## 2022-12-29 DIAGNOSIS — K56609 Unspecified intestinal obstruction, unspecified as to partial versus complete obstruction: Secondary | ICD-10-CM | POA: Diagnosis not present

## 2022-12-29 DIAGNOSIS — Z4682 Encounter for fitting and adjustment of non-vascular catheter: Secondary | ICD-10-CM | POA: Diagnosis not present

## 2022-12-29 DIAGNOSIS — Z89022 Acquired absence of left finger(s): Secondary | ICD-10-CM | POA: Diagnosis not present

## 2022-12-29 DIAGNOSIS — K567 Ileus, unspecified: Secondary | ICD-10-CM | POA: Diagnosis not present

## 2022-12-29 DIAGNOSIS — R066 Hiccough: Secondary | ICD-10-CM | POA: Diagnosis not present

## 2022-12-29 DIAGNOSIS — E43 Unspecified severe protein-calorie malnutrition: Secondary | ICD-10-CM | POA: Diagnosis not present

## 2022-12-29 DIAGNOSIS — U071 COVID-19: Secondary | ICD-10-CM | POA: Diagnosis not present

## 2022-12-29 DIAGNOSIS — D72829 Elevated white blood cell count, unspecified: Secondary | ICD-10-CM | POA: Diagnosis not present

## 2022-12-29 DIAGNOSIS — G8252 Quadriplegia, C1-C4 incomplete: Secondary | ICD-10-CM | POA: Diagnosis not present

## 2022-12-29 DIAGNOSIS — E871 Hypo-osmolality and hyponatremia: Secondary | ICD-10-CM | POA: Diagnosis not present

## 2022-12-29 DIAGNOSIS — K5989 Other specified functional intestinal disorders: Secondary | ICD-10-CM | POA: Diagnosis not present

## 2022-12-29 DIAGNOSIS — E876 Hypokalemia: Secondary | ICD-10-CM | POA: Diagnosis present

## 2022-12-29 DIAGNOSIS — Z4659 Encounter for fitting and adjustment of other gastrointestinal appliance and device: Secondary | ICD-10-CM | POA: Diagnosis not present

## 2022-12-29 DIAGNOSIS — G4700 Insomnia, unspecified: Secondary | ICD-10-CM | POA: Diagnosis not present

## 2022-12-29 LAB — CBC WITH DIFFERENTIAL/PLATELET
Abs Immature Granulocytes: 0.13 10*3/uL — ABNORMAL HIGH (ref 0.00–0.07)
Basophils Absolute: 0.1 10*3/uL (ref 0.0–0.1)
Basophils Relative: 0 %
Eosinophils Absolute: 0.1 10*3/uL (ref 0.0–0.5)
Eosinophils Relative: 0 %
HCT: 45.3 % (ref 39.0–52.0)
Hemoglobin: 14.7 g/dL (ref 13.0–17.0)
Immature Granulocytes: 1 %
Lymphocytes Relative: 5 %
Lymphs Abs: 0.9 10*3/uL (ref 0.7–4.0)
MCH: 26.7 pg (ref 26.0–34.0)
MCHC: 32.5 g/dL (ref 30.0–36.0)
MCV: 82.4 fL (ref 80.0–100.0)
Monocytes Absolute: 1.4 10*3/uL — ABNORMAL HIGH (ref 0.1–1.0)
Monocytes Relative: 8 %
Neutro Abs: 14.8 10*3/uL — ABNORMAL HIGH (ref 1.7–7.7)
Neutrophils Relative %: 86 %
Platelets: 213 10*3/uL (ref 150–400)
RBC: 5.5 MIL/uL (ref 4.22–5.81)
RDW: 13.3 % (ref 11.5–15.5)
WBC: 17.3 10*3/uL — ABNORMAL HIGH (ref 4.0–10.5)
nRBC: 0 % (ref 0.0–0.2)

## 2022-12-29 LAB — CBC
HCT: 42.5 % (ref 39.0–52.0)
Hemoglobin: 13.7 g/dL (ref 13.0–17.0)
MCH: 26.6 pg (ref 26.0–34.0)
MCHC: 32.2 g/dL (ref 30.0–36.0)
MCV: 82.5 fL (ref 80.0–100.0)
Platelets: 205 10*3/uL (ref 150–400)
RBC: 5.15 MIL/uL (ref 4.22–5.81)
RDW: 13.3 % (ref 11.5–15.5)
WBC: 15 10*3/uL — ABNORMAL HIGH (ref 4.0–10.5)
nRBC: 0 % (ref 0.0–0.2)

## 2022-12-29 LAB — COMPREHENSIVE METABOLIC PANEL
ALT: 14 U/L (ref 0–44)
AST: 16 U/L (ref 15–41)
Albumin: 3.9 g/dL (ref 3.5–5.0)
Alkaline Phosphatase: 50 U/L (ref 38–126)
Anion gap: 13 (ref 5–15)
BUN: 31 mg/dL — ABNORMAL HIGH (ref 6–20)
CO2: 20 mmol/L — ABNORMAL LOW (ref 22–32)
Calcium: 8.9 mg/dL (ref 8.9–10.3)
Chloride: 99 mmol/L (ref 98–111)
Creatinine, Ser: 0.65 mg/dL (ref 0.61–1.24)
GFR, Estimated: >60 mL/min (ref >60)
Glucose, Bld: 138 mg/dL — ABNORMAL HIGH (ref 70–99)
Potassium: 3.4 mmol/L — ABNORMAL LOW (ref 3.5–5.1)
Sodium: 132 mmol/L — ABNORMAL LOW (ref 135–145)
Total Bilirubin: 0.7 mg/dL (ref 0.3–1.2)
Total Protein: 7.2 g/dL (ref 6.5–8.1)

## 2022-12-29 LAB — URINALYSIS, ROUTINE W REFLEX MICROSCOPIC
Bilirubin Urine: NEGATIVE
Glucose, UA: NEGATIVE mg/dL
Hgb urine dipstick: NEGATIVE
Ketones, ur: 20 mg/dL — AB
Leukocytes,Ua: NEGATIVE
Nitrite: NEGATIVE
Protein, ur: 30 mg/dL — AB
Specific Gravity, Urine: >1.046 — ABNORMAL HIGH (ref 1.005–1.030)
pH: 5 (ref 5.0–8.0)

## 2022-12-29 LAB — BASIC METABOLIC PANEL
Anion gap: 12 (ref 5–15)
BUN: 24 mg/dL — ABNORMAL HIGH (ref 6–20)
CO2: 20 mmol/L — ABNORMAL LOW (ref 22–32)
Calcium: 8.7 mg/dL — ABNORMAL LOW (ref 8.9–10.3)
Chloride: 100 mmol/L (ref 98–111)
Creatinine, Ser: 0.67 mg/dL (ref 0.61–1.24)
GFR, Estimated: >60 mL/min (ref >60)
Glucose, Bld: 115 mg/dL — ABNORMAL HIGH (ref 70–99)
Potassium: 3.8 mmol/L (ref 3.5–5.1)
Sodium: 132 mmol/L — ABNORMAL LOW (ref 135–145)

## 2022-12-29 LAB — TROPONIN I (HIGH SENSITIVITY)
Troponin I (High Sensitivity): <2 ng/L (ref <18)
Troponin I (High Sensitivity): <2 ng/L (ref <18)

## 2022-12-29 LAB — LACTIC ACID, PLASMA
Lactic Acid, Venous: 1.4 mmol/L (ref 0.5–1.9)
Lactic Acid, Venous: 1.2 mmol/L (ref 0.5–1.9)

## 2022-12-29 LAB — HIV ANTIBODY (ROUTINE TESTING W REFLEX): HIV Screen 4th Generation wRfx: NONREACTIVE

## 2022-12-29 LAB — LIPASE, BLOOD: Lipase: 31 U/L (ref 11–51)

## 2022-12-29 LAB — CREATININE, SERUM
Creatinine, Ser: 0.59 mg/dL — ABNORMAL LOW (ref 0.61–1.24)
GFR, Estimated: >60 mL/min (ref >60)

## 2022-12-29 MED ORDER — ONDANSETRON HCL 4 MG/2ML IJ SOLN: 4.0000 mg | Freq: Four times a day (QID) | INTRAMUSCULAR | Status: DC | PRN

## 2022-12-29 MED ORDER — LACTATED RINGERS IV SOLN: INTRAVENOUS | Status: AC

## 2022-12-29 MED ORDER — ONDANSETRON HCL 4 MG PO TABS
4.0000 mg | ORAL_TABLET | Freq: Four times a day (QID) | ORAL | Status: DC | PRN
  Filled 2022-12-29 (×2): qty 1

## 2022-12-29 MED ORDER — IOHEXOL 300 MG/ML  SOLN
100.0000 mL | Freq: Once | INTRAMUSCULAR | Status: AC | PRN
  Administered 2022-12-29: 100 mL via INTRAVENOUS

## 2022-12-29 MED ORDER — ONDANSETRON HCL 4 MG PO TABS: 4.0000 mg | ORAL_TABLET | Freq: Four times a day (QID) | ORAL | Status: DC | PRN

## 2022-12-29 MED ORDER — ONDANSETRON HCL 4 MG/2ML IJ SOLN
4.0000 mg | Freq: Once | INTRAMUSCULAR | Status: AC
  Administered 2022-12-29: 4 mg via INTRAVENOUS
  Filled 2022-12-29: qty 2

## 2022-12-29 MED ORDER — HYDROMORPHONE HCL 1 MG/ML IJ SOLN
1.0000 mg | Freq: Once | INTRAMUSCULAR | Status: AC
  Administered 2022-12-29: 1 mg via INTRAVENOUS
  Filled 2022-12-29: qty 1

## 2022-12-29 MED ORDER — ENOXAPARIN SODIUM 40 MG/0.4ML IJ SOSY
40.0000 mg | PREFILLED_SYRINGE | INTRAMUSCULAR | Status: DC
  Administered 2022-12-29 – 2023-01-11 (×14): 40 mg via SUBCUTANEOUS
  Filled 2022-12-29 (×14): qty 0.4

## 2022-12-29 MED ORDER — ONDANSETRON HCL 4 MG/2ML IJ SOLN
4.0000 mg | Freq: Four times a day (QID) | INTRAMUSCULAR | Status: DC | PRN
  Administered 2022-12-29 – 2023-01-10 (×14): 4 mg via INTRAVENOUS
  Filled 2022-12-29 (×14): qty 2

## 2022-12-29 MED ORDER — POTASSIUM CHLORIDE 10 MEQ/100ML IV SOLN
10.0000 meq | INTRAVENOUS | Status: AC
  Administered 2022-12-29 (×2): 10 meq via INTRAVENOUS
  Filled 2022-12-29: qty 100

## 2022-12-29 MED ORDER — SODIUM CHLORIDE 0.9% FLUSH
3.0000 mL | Freq: Two times a day (BID) | INTRAVENOUS | Status: DC
  Administered 2022-12-29 – 2023-01-09 (×3): 3 mL via INTRAVENOUS

## 2022-12-29 MED ORDER — SODIUM CHLORIDE 0.9 % IV BOLUS
1000.0000 mL | Freq: Once | INTRAVENOUS | Status: AC
  Administered 2022-12-29: 1000 mL via INTRAVENOUS

## 2022-12-29 MED ORDER — HYDROMORPHONE HCL 1 MG/ML IJ SOLN
0.5000 mg | INTRAMUSCULAR | Status: DC | PRN
  Administered 2022-12-29 (×2): 0.5 mg via INTRAVENOUS
  Administered 2023-01-01 – 2023-01-02 (×3): 1 mg via INTRAVENOUS
  Filled 2022-12-29 (×5): qty 1

## 2022-12-29 NOTE — H&P (Signed)
History and Physical    Carlos [name] [id number] DOB: [birth year] DOA: 12/28/2022  PCP: Nicholes Rough, PA-C Patient coming from: Home  Chief Complaint: Abdominal swelling and pressure  HPI: Carlos [name] is a 57 y.o. 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 He has never had abdominal surgery and has never had this before.  He does struggle with constipation, but never to this extent.  He noted having cold like symptoms prior to all of this developing, which have resolved.  He drank a lot of orange juice and then chili from [facility] before this happened, but otherwise no change in diet.  He notes no passing gas for 5 days as well.  NGT was placed in the ED and he has since started passing some gas.   ED Course: In the ED, he was found to have a Na of 132, potassium of 3.4, WBC of 17.3 with a neutrophil predominance.  BC were ordered and sent.  CT scan showed a functional or low grade SBO with a large amount of stool in the colon.  NGT was placed draining a large amount of brown fluid.   Review of Systems: As per HPI otherwise all other systems reviewed and are negative.  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 He has never used smokeless tobacco. He reports current alcohol use of about 2.0 standard drinks of alcohol per  week. He reports that he does not use drugs.  Allergies  Allergen Reactions   Bactrim [Sulfamethoxazole-Trimethoprim] Hives and Rash    Family History  Problem Relation Age of Onset   Breast cancer Mother        DCIS times 2    Prior to Admission medications   Medication Sig Start Date End Date Taking? Authorizing Provider  acetaminophen (TYLENOL) 325 MG tablet Take 2 tablets (650 mg total) by mouth every 6 (six) hours as needed for mild pain (or Fever >/= 101). 09/27/19  Yes Nita Sells, MD  polyethylene glycol (MIRALAX / GLYCOLAX) 17 g packet Take 17 g by mouth daily as needed for mild constipation.   Yes [provider]  senna (SENOKOT) 8.6 MG tablet Take 1 tablet by mouth daily.   Yes [provider]           Physical Exam: Vitals:   12/28/22 2330 12/29/22 0230 12/29/22 0300 12/29/22 0420  BP: 124/87 136/83 134/87   Pulse: 91 89 96   Resp: '16 13 13   '$ Temp:    98.5 F (36.9 C)  TempSrc:    Oral  SpO2: 98% 92% 97%   Weight:      Height:        Constitutional: NAD, calm, comfortable Eyes: lids and conjunctivae normal ENMT: Mucous membranes are somewhat dry. NGT in place draining dark brown fluid.  Neck: normal, supple  Respiratory: Breathing comfortably on room air, no wheezing Cardiovascular: RR, NR, no murmur noted, no pedal edema Abdomen: + TTP in the upper regions, distended, tympanic, paucity of bowel sounds.  Musculoskeletal: contracture of the left arm/hand.  Contracture of the right hand.   Skin: He has a red scaly rash of the face, which is chronic for him, improves with steroid cream.  He has no rash or wound on exposed skin.  Neurologic: contractures noted, he moves right hand easily.   Psychiatric: Normal judgment and insight. Alert and oriented x 3. Normal mood.    Labs on Admission: I have personally reviewed following labs and imaging studies  CBC: Recent Labs  Lab 12/29/22 0147  WBC 17.3*  NEUTROABS 14.8*  HGB 14.7   HCT 45.3  MCV 82.4  PLT 123456    Basic Metabolic Panel: Recent Labs  Lab 12/29/22 0147  NA 132*  K 3.4*  CL 99  CO2 20*  GLUCOSE 138*  BUN 31*  CREATININE 0.65  CALCIUM 8.9    GFR: Estimated Creatinine Clearance: 129.6 mL/min (by C-G formula based on SCr of 0.65 mg/dL).  Liver Function Tests: Recent Labs  Lab 12/29/22 0147  AST 16  ALT 14  ALKPHOS 50  BILITOT 0.7  PROT 7.2  ALBUMIN 3.9    Urine analysis:    Component Value Date/Time   COLORURINE YELLOW 12/29/2022 0429   APPEARANCEUR CLEAR 12/29/2022 0429   LABSPEC >1.046 (H) 12/29/2022 0429   PHURINE 5.0 12/29/2022 0429   GLUCOSEU NEGATIVE 12/29/2022 0429   HGBUR NEGATIVE 12/29/2022 0429   BILIRUBINUR NEGATIVE 12/29/2022 0429   KETONESUR 20 (A) 12/29/2022 0429   PROTEINUR 30 (A) 12/29/2022 0429   NITRITE NEGATIVE 12/29/2022 0429   LEUKOCYTESUR NEGATIVE 12/29/2022 0429    Radiological Exams on Admission: DG Abdomen 1 View  Result Date: 12/29/2022 CLINICAL DATA:  57 year old male status post nasogastric tube placement. EXAM: ABDOMEN - 1 VIEW COMPARISON:  Chest x-ray 12/29/2022. FINDINGS: Nasogastric tube tip is in the proximal stomach, with side port in the distal esophagus. Irregular opacities are noted in the lung bases bilaterally, likely to reflect areas of scarring and/or atelectasis. Abdomen is incompletely imaged, but visualized upper abdomen demonstrates gaseous distention of the stomach. IMPRESSION: 1. Nasogastric tube tip is in the proximal stomach, but side port remains in the distal esophagus. The tube should be advanced at least 10 cm for more optimal placement. Electronically Signed   By: Vinnie Langton M.D.   On: 12/29/2022 05:30   CT ABDOMEN PELVIS W CONTRAST  Addendum Date: 12/29/2022   ADDENDUM REPORT: 12/29/2022 03:46 ADDENDUM: Jejunal transition point best visualized on series 2, image 50. This may indicate a low-grade or functional small-bowel obstruction. The distal small bowel is  decompressed. Large amount of stool in the colon. Electronically Signed   By: Ulyses Jarred M.D.   On: 12/29/2022 03:46   Result Date: 12/29/2022 CLINICAL DATA:  Small-bowel obstruction EXAM: CT ABDOMEN AND PELVIS WITH CONTRAST TECHNIQUE: Multidetector CT imaging of the abdomen and pelvis was performed using the standard protocol following bolus administration of intravenous contrast. RADIATION DOSE REDUCTION: This exam was performed according to the departmental dose-optimization program which includes automated exposure control, adjustment of the mA and/or kV according to patient size and/or use of iterative reconstruction technique. CONTRAST:  176m OMNIPAQUE IOHEXOL 300 MG/ML  SOLN COMPARISON:  None Available. FINDINGS: Lower Chest: Normal. Hepatobiliary: Normal hepatic contours. No intra- or extrahepatic biliary dilatation. The gallbladder is normal. Pancreas: Normal pancreas. No  ductal dilatation or peripancreatic fluid collection. Spleen: Normal. Adrenals/Urinary Tract: The adrenal glands are normal. No hydronephrosis, nephroureterolithiasis or solid renal mass. The urinary bladder is normal for degree of distention Stomach/Bowel: Distended and fluid-filled stomach with fluid extending into the distal thoracic esophagus. Mildly dilated duodenum and proximal jejunum. Gradual transition in the mid abdomen. No focal colonic abnormality. Normal appendix. Vascular/Lymphatic: Normal course and caliber of the major abdominal vessels. No abdominal or pelvic lymphadenopathy. Reproductive: Normal prostate size with symmetric seminal vesicles. Other: None. Musculoskeletal: No bony spinal canal stenosis or focal osseous abnormality. IMPRESSION: 1. Distended and fluid-filled stomach with fluid extending into the distal thoracic esophagus. 2. Mildly dilated duodenum and proximal jejunum with gradual transition point in the mid abdomen. Electronically Signed: By: Ulyses Jarred M.D. On: 12/29/2022 03:18   DG Abdomen  Acute W/Chest  Result Date: 12/29/2022 CLINICAL DATA:  Shortness of breath and abdominal distention EXAM: DG ABDOMEN ACUTE WITH 1 VIEW CHEST COMPARISON:  None Available. FINDINGS: Atelectasis or scarring in the right hilum and lower lung. Hyperinflation of the left lung. No pleural effusion or pneumothorax. Normal cardiomediastinal silhouette. No acute osseous abnormality. Marked gaseous distention of the stomach. Gaseous dilation of multiple loops of small bowel in the central abdomen measuring up to 6.3 cm in diameter. Gas-filled colon without definite dilation. IMPRESSION: Marked gaseous dilation of the stomach and small bowel. CT is recommended to evaluate for obstruction. Atelectasis or scarring in the right mid and lower lung. Hyperinflation of the left lung. Electronically Signed   By: Placido Sou M.D.   On: 12/29/2022 01:44    EKG: Independently reviewed. Significant artifact.  TWI in II and aVF.  Would repeat if any concerning symptoms arise.   Assessment/Plan  SBO (small bowel obstruction) (HCC) Leukocytosis - No clear sign of infection - Large stool burden in the colon, ? Functional given history of constipation - Continue NGT, consider clamping trial in 24-48 hours - EDP discussed with general surgery, no history of surgical abdomen - Follow up blood cultures - IVF with LR at 75cc/hr  Hypokalemia Hyponatremia - Likely from lack of PO intake - IVF with LR at 75cc/hr - Replace K+ IV    Spinal cord ependymoma (HCC) S/P laminectomy - Chronic contractures and decreased ambulation - PT consult.     DVT prophylaxis: Lovenox  Code Status:   Full  Family Communication:  Mom at bedside  Disposition Plan:   Patient is from:  Home  Anticipated DC to:  Home  Anticipated DC date:  01/01/24  Anticipated DC barriers: Ability to clear SBO  Consults called:  EDP discussed with gen surg, but no formal consult  Admission status:  IP, med surg  Severity of Illness: The appropriate  patient status for this patient is INPATIENT. Inpatient status is judged to be reasonable and necessary in order to provide the required intensity of service to ensure the patient's safety. The patient's presenting symptoms, physical exam findings, and initial radiographic and laboratory data in the context of their chronic comorbidities is felt to place them at high risk for further clinical deterioration. Furthermore, it is not anticipated that the patient will be medically stable for discharge from the hospital within 2 midnights of admission.   * I certify that at the point of admission it is my clinical judgment that the patient will require inpatient hospital care spanning beyond 2 midnights from the point of admission due to high intensity of service, high risk for further deterioration and high frequency  of surveillance required.Gilles Chiquito MD [facility]  How to contact the [facility] Attending or Consulting provider [place] or covering provider during after hours [place], for this patient?   Check the care team in [facility] and look for a) attending/consulting [facility] provider listed and b) the [facility] team listed Log into [email or web address] and use [employer]'s universal password to access. If you do not have the password, please contact the hospital operator. Locate the [facility] provider you are looking for under [facility] and page to a number that you can be directly reached. If you still have difficulty reaching the provider, please page the [facility] (Director on Call) for the Hospitalists listed on amion for assistance.  12/29/2022, 5:47 AM

## 2022-12-29 NOTE — Evaluation (Signed)
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  Clinical Impression  Pt admitted with above diagnosis.  Pt currently with functional limitations due to the deficits listed below (see PT Problem List). Pt will benefit from skilled PT to increase their independence and safety with mobility to allow discharge to the venue listed below.  Pt mostly limited to bed and w/c at baseline.  Pt reports his last transfer to w/c was 2 weeks ago.  Pt was discharged from [facility] in December, and they had recommended HHPT for pt to have more consistent treatments and practice/educate family/caregivers on transfers (pt had difficulty with transportation to clinic). Pt reports this was never started, and he would very much like HHPT upon d/c.        Recommendations for follow up therapy are one component of a multi-disciplinary discharge planning process, led by the attending physician.  Recommendations may be updated based on patient status, additional functional criteria and insurance authorization.  Follow Up Recommendations Home health PT      Assistance Recommended at Discharge    Patient can return home with the following  Two people to help with walking and/or transfers;A lot of help with bathing/dressing/bathroom;Assist for transportation;Help with stairs or ramp for entrance    Equipment Recommendations None recommended by PT  Recommendations for Other Services       Functional Status Assessment Patient has had a recent decline in their functional status and demonstrates the ability to make significant improvements in function in a reasonable and predictable amount of time.     Precautions / Restrictions Precautions Precautions: Fall       Mobility  Bed Mobility Overal bed mobility: Needs Assistance             General bed mobility comments: pt able to pull trunk into long sitting position with use of bed rail on Rt, typically able to assist with rolling per pt    Transfers                        Ambulation/Gait                  Stairs            Wheelchair Mobility    Modified Rankin (Stroke Patients Only)       Balance                                             Pertinent Vitals/Pain Pain Assessment Pain Assessment: No/denies pain    Home Living Family/patient expects to be discharged to:: Private residence Living Arrangements: Children (son)   Type of Home: House Home Access: Stairs to enter   [employer] of Steps: 1 step (small threshold size)   Home Layout: One level Home Equipment: Wheelchair - power;Wheelchair - manual Additional Comments: uses Regular w/c the most; pt's bathroom is not w/c accessible (door too narrow)    Prior Function Prior Level of Function : Needs assist             Mobility Comments: has been 2 weeks since transfer to w/c (has sliding board but didnt use if he had  physical help) ADLs Comments: caregiver for 2hrs every day for all bathing, dressing, transfers; pt able to self feed     Hand Dominance        Extremity/Trunk Assessment   Upper Extremity Assessment Upper Extremity Assessment: LUE deficits/detail;RUE deficits/detail RUE Deficits / Details: active shoulder movement limited to 90*, finger contractures observed, overall right UE more motion and strength then Lt UE LUE Deficits / Details: finger flexion, wrist extension, elbow flexion contractures, limited active and passive shoulder movement however pt reports limiting shoulder movement due to NG tube pinned to this area    Lower Extremity Assessment Lower Extremity Assessment: RLE deficits/detail;LLE deficits/detail RLE Deficits /  Details: grossly 2+/5 throughout, prefers to rest in flexed pattern RLE Coordination: decreased gross motor;decreased fine motor LLE Deficits / Details: grossly 2+/5 throughout, prefers to rest in flexed pattern LLE Coordination: decreased gross motor;decreased fine motor       Communication   Communication: No difficulties  Cognition Arousal/Alertness: Awake/alert Behavior During Therapy: WFL for tasks assessed/performed Overall Cognitive Status: Within Functional Limits for tasks assessed                                          General Comments      Exercises     Assessment/Plan    PT Assessment Patient needs continued PT services  PT Problem List Decreased strength;Decreased coordination;Decreased activity tolerance;Decreased balance;Decreased mobility;Decreased knowledge of use of DME;Decreased range of motion       PT Treatment Interventions DME instruction;Balance training;Therapeutic exercise;Functional mobility training;Therapeutic activities;Patient/family education;Wheelchair mobility training;Neuromuscular re-education    PT Goals (Current goals can be found in the Care Plan section)  Acute Rehab PT Goals PT Goal Formulation: With patient Time For Goal Achievement: 01/12/23 Potential to Achieve Goals: Fair    Frequency Min 2X/week     Co-evaluation               AM-PAC PT "6 Clicks" Mobility  Outcome Measure Help needed turning from your back to your side while in a flat bed without using bedrails?: A Lot Help needed moving from lying on your back to sitting on the side of a flat bed without using bedrails?: A Lot Help needed moving to and from a bed to a chair (including a wheelchair)?: Total Help needed standing up from a chair using your arms (e.g., wheelchair or bedside chair)?: Total Help needed to walk in hospital room?: Total Help needed climbing 3-5 steps with a railing? : Total 6 Click Score: 8    End of Session    Activity Tolerance: Patient tolerated treatment well Patient left: in bed;with call bell/phone within reach Nurse Communication: Mobility status PT Visit Diagnosis: Other symptoms and signs involving the nervous system (R29.898);Muscle weakness (generalized) (M62.81)    Time: [id number] PT Time Calculation (min) (ACUTE ONLY): 25 min   Charges:   PT Evaluation $PT Eval Low Complexity: 1 Low        Kati PT, DPT Physical Therapist Acute Rehabilitation Services Preferred contact method: Secure Chat Weekend Pager Only: [phone] Office: [place] 12/29/2022, 4:00 PM

## 2022-12-29 NOTE — Progress Notes (Signed)
Brief same day note:  Patient is a 57 year old male with history of spinal ependymoma status post surgery, psoriasis,bed bound  who presented with 5-day history of abdominal pain, nausea, no bowel movement no history of abdominal surgery in the past.  History of chronic constipation.  On presentation lab work showed sodium 132, potassium 3.4, WBC of 17.3.  CT scan showed functional low-grade SBO with large amount of stool in the colon.  Started on conservative management after admitted for the management of low-grade SBO.  NG tube placed.  Patient seen and examined at bedside today.  Hemodynamically stable.  Comfortable.  Denies any abdominal pain, nausea or vomiting.  Passing gas.  Mother at bedside.  Abdomen is soft, non distended.   Assessment and plan:  SBO: CT scan showed functional low-grade SBO with large amount of stool in the colon.Showed  distended and fluid-filled stomach with fluid extending into the distal thoracic esophagus.  Mildly dilated duodenum and proximal jejunum with gradual transition point in the mid abdomen. Started on conservative management after admitted for the management of low-grade SBO.  NG tube placed. Continue IV fluids, antiemetics. Patient denies any abdomen pain, nausea or vomiting.  NG tube draining dark fluid.  He is passing gas.  He has good bowel sounds.  Leukocytosis: Unclear etiology, improving, most likely reactive.  Hypokalemia/hyponatremia: Continue to monitor  History of spinal cord ependymoma: S/p laminectomy.  Has chronic contractures and decreased ambulation/mostly bed bound.  PT consulted here.  History of psoriasis

## 2022-12-29 NOTE — ED Provider Notes (Signed)
[place] AT [facility] Provider Note   CSN: [id number] Arrival date & time: 12/28/22  2310     History  Chief Complaint  Patient presents with   Constipation    Carlos [name] is a 57 y.o. male.  HPI   Patient with medical history including intraspinal ependmoma status post removal 2022 presenting with complaints of abdominal pain.  Patient states that he has been unable to have a bowel movement over the last 5 days, he states that he has generalized tenderness in his abdomen, he states he has been trying laxative without much relief,  states had a very small amount of stool earlier today, states that he has not been able to pass any gas, he denies any bloody stool or dark tarry schools, he denies any urinary symptoms denies any dysuria hematuria, states he had associated nausea without vomiting.  He has no abdominal surgeries, states that since having his spinal mass removed he has been developing times of severe constipation in times of diarrhea.  No associate fever chills cough congestion body aches.  He does note that he feels short of breath but states that this is from his abdomen being so distended.  No history of chest pain or pleuritic chest pain.    Home Medications Prior to Admission medications   Medication Sig Start Date End Date Taking? Authorizing Provider  acetaminophen (TYLENOL) 325 MG tablet Take 2 tablets (650 mg total) by mouth every 6 (six) hours as needed for mild pain (or Fever >/= 101). 09/27/19  Yes Nita Sells, MD  polyethylene glycol (MIRALAX / GLYCOLAX) 17 g packet Take 17 g by mouth daily as needed for mild constipation.   Yes [provider]  senna (SENOKOT) 8.6 MG tablet Take 1 tablet by mouth daily.   Yes [provider]  baclofen (LIORESAL) 10 MG tablet Take 10 mg by mouth 3 (three) times daily. Patient not taking: Reported on 12/29/2022 07/05/21   Myrle Sheng, MD      Allergies     Bactrim [sulfamethoxazole-trimethoprim]    Review of Systems   Review of Systems  Constitutional:  Negative for chills and fever.  Respiratory:  Positive for shortness of breath.   Cardiovascular:  Negative for chest pain.  Gastrointestinal:  Positive for abdominal distention, abdominal pain, constipation and nausea. Negative for vomiting.  Neurological:  Negative for headaches.    Physical Exam Updated Vital Signs BP 134/87   Pulse 96   Temp 98.5 F (36.9 C) (Oral)   Resp 13   Ht '6\' 4"'$  (1.93 m)   Wt 90.7 kg   SpO2 97%   BMI 24.34 kg/m  Physical Exam Vitals and nursing note reviewed.  Constitutional:      General: He is in acute distress.     Appearance: He is ill-appearing.  HENT:     Head: Normocephalic and atraumatic.     Nose: No congestion.  Eyes:     Conjunctiva/sclera: Conjunctivae normal.  Cardiovascular:     Rate and Rhythm: Normal rate and regular rhythm.     Pulses: Normal pulses.     Heart sounds: No murmur heard.    No friction rub. No gallop.  Pulmonary:     Effort: No respiratory distress.     Breath sounds: No wheezing, rhonchi or rales.  Abdominal:     General: There is distension.     Palpations: Abdomen is soft.     Tenderness: There is abdominal tenderness. There  is no right CVA tenderness or left CVA tenderness.     Comments: Abdomen is distended, tympanic to percussion, he has generalized abdominal pain without guarding rebound tenderness or peritoneal sign.  Musculoskeletal:     Comments: Patient's left arm is extended permanently, this is his baseline, moving the other 3 extremities without difficulty.  Skin:    General: Skin is warm and dry.  Neurological:     Mental Status: He is alert.     Comments: No facial asymmetry, no new focal deficits present my exam.  Psychiatric:        Mood and Affect: Mood normal.     ED Results / Procedures / Treatments   Labs (all labs ordered are listed, but only abnormal results are  displayed) Labs Reviewed  COMPREHENSIVE METABOLIC PANEL - Abnormal; Notable for the following components:      Result Value   Sodium 132 (*)    Potassium 3.4 (*)    CO2 20 (*)    Glucose, Bld 138 (*)    BUN 31 (*)    All other components within normal limits  CBC WITH DIFFERENTIAL/PLATELET - Abnormal; Notable for the following components:   WBC 17.3 (*)    Neutro Abs 14.8 (*)    Monocytes Absolute 1.4 (*)    Abs Immature Granulocytes 0.13 (*)    All other components within normal limits  URINALYSIS, ROUTINE W REFLEX MICROSCOPIC - Abnormal; Notable for the following components:   Specific Gravity, Urine >1.046 (*)    Ketones, ur 20 (*)    Protein, ur 30 (*)    Bacteria, UA RARE (*)    All other components within normal limits  CULTURE, BLOOD (ROUTINE X 2)  CULTURE, BLOOD (ROUTINE X 2)  LACTIC ACID, PLASMA  LACTIC ACID, PLASMA  LIPASE, BLOOD  TROPONIN I (HIGH SENSITIVITY)  TROPONIN I (HIGH SENSITIVITY)    EKG EKG Interpretation  Date/Time:  Sunday December 29 2022 01:48:49 EST Ventricular Rate:  95 PR Interval:  157 QRS Duration: 98 QT Interval:  358 QTC Calculation: 450 R Axis:   96 Text Interpretation: Sinus rhythm Probable left atrial enlargement Left ventricular hypertrophy Artifact Confirmed by Quintella Reichert [phone]) on 12/29/2022 3:25:18 AM  Radiology CT ABDOMEN PELVIS W CONTRAST  Addendum Date: 12/29/2022   ADDENDUM REPORT: 12/29/2022 03:46 ADDENDUM: Jejunal transition point best visualized on series 2, image 50. This may indicate a low-grade or functional small-bowel obstruction. The distal small bowel is decompressed. Large amount of stool in the colon. Electronically Signed   By: Ulyses Jarred M.D.   On: 12/29/2022 03:46   Result Date: 12/29/2022 CLINICAL DATA:  Small-bowel obstruction EXAM: CT ABDOMEN AND PELVIS WITH CONTRAST TECHNIQUE: Multidetector CT imaging of the abdomen and pelvis was performed using the standard protocol following bolus administration  of intravenous contrast. RADIATION DOSE REDUCTION: This exam was performed according to the departmental dose-optimization program which includes automated exposure control, adjustment of the mA and/or kV according to patient size and/or use of iterative reconstruction technique. CONTRAST:  126m OMNIPAQUE IOHEXOL 300 MG/ML  SOLN COMPARISON:  None Available. FINDINGS: Lower Chest: Normal. Hepatobiliary: Normal hepatic contours. No intra- or extrahepatic biliary dilatation. The gallbladder is normal. Pancreas: Normal pancreas. No ductal dilatation or peripancreatic fluid collection. Spleen: Normal. Adrenals/Urinary Tract: The adrenal glands are normal. No hydronephrosis, nephroureterolithiasis or solid renal mass. The urinary bladder is normal for degree of distention Stomach/Bowel: Distended and fluid-filled stomach with fluid extending into the distal thoracic esophagus. Mildly dilated duodenum and  proximal jejunum. Gradual transition in the mid abdomen. No focal colonic abnormality. Normal appendix. Vascular/Lymphatic: Normal course and caliber of the major abdominal vessels. No abdominal or pelvic lymphadenopathy. Reproductive: Normal prostate size with symmetric seminal vesicles. Other: None. Musculoskeletal: No bony spinal canal stenosis or focal osseous abnormality. IMPRESSION: 1. Distended and fluid-filled stomach with fluid extending into the distal thoracic esophagus. 2. Mildly dilated duodenum and proximal jejunum with gradual transition point in the mid abdomen. Electronically Signed: By: Ulyses Jarred M.D. On: 12/29/2022 03:18   DG Abdomen Acute W/Chest  Result Date: 12/29/2022 CLINICAL DATA:  Shortness of breath and abdominal distention EXAM: DG ABDOMEN ACUTE WITH 1 VIEW CHEST COMPARISON:  None Available. FINDINGS: Atelectasis or scarring in the right hilum and lower lung. Hyperinflation of the left lung. No pleural effusion or pneumothorax. Normal cardiomediastinal silhouette. No acute osseous  abnormality. Marked gaseous distention of the stomach. Gaseous dilation of multiple loops of small bowel in the central abdomen measuring up to 6.3 cm in diameter. Gas-filled colon without definite dilation. IMPRESSION: Marked gaseous dilation of the stomach and small bowel. CT is recommended to evaluate for obstruction. Atelectasis or scarring in the right mid and lower lung. Hyperinflation of the left lung. Electronically Signed   By: Placido Sou M.D.   On: 12/29/2022 01:44    Procedures Procedures    Medications Ordered in ED Medications  sodium chloride 0.9 % bolus 1,000 mL (0 mLs Intravenous Stopped 12/29/22 0245)  HYDROmorphone (DILAUDID) injection 1 mg (1 mg Intravenous Given 12/29/22 0145)  ondansetron (ZOFRAN) injection 4 mg (4 mg Intravenous Given 12/29/22 0145)  iohexol (OMNIPAQUE) 300 MG/ML solution 100 mL (100 mLs Intravenous Contrast Given 12/29/22 0240)    ED Course/ Medical Decision Making/ A&P                             Medical Decision Making Amount and/or Complexity of Data Reviewed Labs: ordered. Radiology: ordered.  Risk Prescription drug management. Decision regarding hospitalization.   This patient presents to the ED for concern of abdominal pain, this involves an extensive number of treatment options, and is a complaint that carries with it a high risk of complications and morbidity.  The differential diagnosis includes bowel obstruction, volvulus, diverticulitis, appendicitis    Additional history obtained:  Additional history obtained from caregiver at bedside External records from outside source obtained and reviewed including neurology note    Co morbidities that complicate the patient evaluation  Bedbound  Social Determinants of Health:  N/A    Lab Tests:  I Ordered, and personally interpreted labs.  The pertinent results include: CBC shows leukocytosis of 17.3   Imaging Studies ordered:  I ordered imaging studies including acute  chest abdomen, CT of abdomen pelvis I independently visualized and interpreted imaging which showed x-ray shows concerns for bowel obstruction, CT reveals functional/low-grade bowel obstruction. I agree with the radiologist interpretation   Cardiac Monitoring:  The patient was maintained on a cardiac monitor.  I personally viewed and interpreted the cardiac monitored which showed an underlying rhythm of: Sinus rhythm with likely LVH   Medicines ordered and prescription drug management:  I ordered medication including fluids, pain medication I have reviewed the patients home medicines and have made adjustments as needed  Critical Interventions:  N/A   Reevaluation:  Presents with abdominal pain, my exam is concerning for possible bowel obstruction, will obtain lab work imaging and reassess  X-ray shows no large bowel  concern for obstruction, contacted CT scan to make him next for imaging  Updated the patient, explained that he would likely benefit from an NG tube, wrist manage for discussed, he is agreement with proceeding.  He is also agreement with admission at this time.    Consultations Obtained:  I requested consultation with the spoke with Dr. Daryll Drown,  and discussed lab and imaging findings as well as pertinent plan - they recommend: who Will admit the patient.    Test Considered:  N/A    Rule out I have low suspicion for ACS as history is atypical, patient has no cardiac history, patient has negative delta troponins, there was questionable ST elevation on EKG suspect this was likely LVH patient understanding chest pain.  He did endorse that he was short of breath but this is likely from the large stomach distention from bowel obstruction.  Low suspicion for PE as patient denies pleuritic chest pain,, patient denies leg pain, no pedal edema noted on exam, vital signs reassuring nontachypneic nonhypoxic.  Again he notes that he was short of breath but this is like from  the large distention seen in his abdomen from the bowel obstruction.  Suspicion for diverticulitis, appendicitis, pancreatitis, pyelo-, kidney stone, AAA CT imaging is all negative these findings.  Patient does have a noted leukocytosis, I doubt this is secondary due to infection and expect this more acute phase reaction from bowel obstruction, will defer on antibiotics as he is afebrile nontachycardic.   Dispostion and problem list  After consideration of the diagnostic results and the patients response to treatment, I feel that the patent would benefit from admission.  Bowel obstruction-currently has an NG tube in place, will need continued bowel rest, continue monitoring, possibly need formal consultation by general surgery.            Final Clinical Impression(s) / ED Diagnoses Final diagnoses:  Intestinal obstruction, unspecified cause, unspecified whether partial or complete [facility])    Rx / DC Orders ED Discharge Orders     None         Aron Baba 12/29/22 [id number]    Quintella Reichert, MD 12/29/22 [phone]

## 2022-12-29 NOTE — ED Notes (Signed)
NG tube placed by Mickle Plumb, patient tolerated okay. Order placed to verify placement.

## 2022-12-29 NOTE — Plan of Care (Signed)
  Problem: Education: Goal: Knowledge of General Education information will improve Description Including pain rating scale, medication(s)/side effects and non-pharmacologic comfort measures Outcome: Progressing   Problem: Health Behavior/Discharge Planning: Goal: Ability to manage health-related needs will improve Outcome: Progressing   

## 2022-12-30 ENCOUNTER — Inpatient Hospital Stay (HOSPITAL_COMMUNITY): Payer: Medicaid Other

## 2022-12-30 DIAGNOSIS — K5669 Other partial intestinal obstruction: Secondary | ICD-10-CM | POA: Diagnosis not present

## 2022-12-30 DIAGNOSIS — K56609 Unspecified intestinal obstruction, unspecified as to partial versus complete obstruction: Secondary | ICD-10-CM | POA: Diagnosis not present

## 2022-12-30 LAB — CBC
HCT: 44.9 % (ref 39.0–52.0)
Hemoglobin: 14.2 g/dL (ref 13.0–17.0)
MCH: 26.5 pg (ref 26.0–34.0)
MCHC: 31.6 g/dL (ref 30.0–36.0)
MCV: 83.9 fL (ref 80.0–100.0)
Platelets: 183 10*3/uL (ref 150–400)
RBC: 5.35 MIL/uL (ref 4.22–5.81)
RDW: 13.5 % (ref 11.5–15.5)
WBC: 8.9 10*3/uL (ref 4.0–10.5)
nRBC: 0 % (ref 0.0–0.2)

## 2022-12-30 LAB — BLOOD CULTURE ID PANEL (REFLEXED) - BCID2

## 2022-12-30 LAB — BASIC METABOLIC PANEL
Anion gap: 12 (ref 5–15)
BUN: 23 mg/dL — ABNORMAL HIGH (ref 6–20)
CO2: 24 mmol/L (ref 22–32)
Calcium: 8.6 mg/dL — ABNORMAL LOW (ref 8.9–10.3)
Chloride: 97 mmol/L — ABNORMAL LOW (ref 98–111)
Creatinine, Ser: 0.83 mg/dL (ref 0.61–1.24)
GFR, Estimated: >60 mL/min (ref >60)
Glucose, Bld: 81 mg/dL (ref 70–99)
Potassium: 4 mmol/L (ref 3.5–5.1)
Sodium: 133 mmol/L — ABNORMAL LOW (ref 135–145)

## 2022-12-30 MED ORDER — DIATRIZOATE MEGLUMINE & SODIUM 66-10 % PO SOLN
90.0000 mL | Freq: Once | ORAL | Status: AC
  Administered 2022-12-30: 90 mL via NASOGASTRIC
  Filled 2022-12-30: qty 90

## 2022-12-30 MED ORDER — ACETAMINOPHEN 650 MG RE SUPP
650.0000 mg | Freq: Once | RECTAL | Status: AC
  Administered 2022-12-30: 650 mg via RECTAL
  Filled 2022-12-30: qty 1

## 2022-12-30 MED ORDER — BISACODYL 10 MG RE SUPP
10.0000 mg | Freq: Once | RECTAL | Status: AC
  Administered 2022-12-30: 10 mg via RECTAL
  Filled 2022-12-30: qty 1

## 2022-12-30 MED ORDER — SODIUM CHLORIDE 0.9 % IV SOLN: INTRAVENOUS | Status: DC

## 2022-12-30 NOTE — TOC Initial Note (Signed)
Transition of Care [facility]) - Initial/Assessment Note   Patient Details  Name: Carlos [name] MRN: [id number] Date of Birth: October 28, 1966  Transition of Care [facility]) CM/SW Contact:    Sherie Don, LCSW Phone Number: 12/30/2022, 1:01 PM  Clinical Narrative: PT evaluation recommended HHPT and patient is agreeable. CSW made [facility] referrals to the following agencies:  [facility]: declined [facility]: declined [facility] HH: declined [facility]: accepted  CSW updated patient. HH orders have been placed.  Expected Discharge Plan: [place] Barriers to Discharge: Continued Medical Work up  Patient Goals and CMS Choice Patient states their goals for this hospitalization and ongoing recovery are:: Go home with [place] CMS Medicare.gov Compare Post Acute Care list provided to:: Patient Choice offered to / list presented to : Patient  Expected Discharge Plan and Services In-house Referral: Clinical Social Work Post Acute Care Choice: [place] arrangements for the past 2 months: Single Family Home            DME Arranged: N/A DME Agency: NA HH Arranged: PT HH Agency: [place] Date [place]: 12/30/22 Representative spoke with at [place]: Claiborne Billings  Prior Living Arrangements/Services Living arrangements for the past 2 months: [place] Patient language and need for interpreter reviewed:: Yes Do you feel safe going back to the place where you live?: Yes      Need for Family Participation in Patient Care: Yes (Comment) Care giver support system in place?: Yes (comment) Criminal Activity/Legal Involvement Pertinent to Current Situation/Hospitalization: No - Comment as needed  Permission Sought/Granted Permission sought to share information with : Other (comment) Permission granted to share information with : Yes, Verbal Permission Granted Permission granted to share info w AGENCY: [place] agencies  Emotional Assessment Appearance:: Appears stated  age Attitude/Demeanor/Rapport: Engaged Affect (typically observed): Accepting, Appropriate Orientation: : Oriented to Self, Oriented to Place, Oriented to  Time, Oriented to Situation Alcohol / Substance Use: Not Applicable Psych Involvement: No (comment)  Admission diagnosis:  SBO (small bowel obstruction) ([place]) [K56.609] Intestinal obstruction, unspecified cause, unspecified whether partial or complete [facility]) [K56.609] Patient Active Problem List   Diagnosis Date Noted   SBO (small bowel obstruction) ([place]) 12/29/2022   S/P laminectomy 02/19/2021   Spinal cord ependymoma ([place]) 10/11/2020   Finger osteomyelitis, 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

## 2022-12-30 NOTE — Progress Notes (Signed)
PROGRESS NOTE  Carlos [name]  [id number] DOB: [birth year] DOA: 12/28/2022 PCP: Nicholes Rough, PA-C   Brief Narrative: Patient is a 57 year old male with history of spinal ependymoma status post surgery, psoriasis,bed bound  who presented with 5-day history of abdominal pain, nausea, no bowel movement no history of abdominal surgery in the past.  History of chronic constipation.  On presentation lab work showed sodium 132, potassium 3.4, WBC of 17.3.  CT scan showed functional low-grade SBO with large amount of stool in the colon.  Started on conservative management after admitted for the management of low-grade SBO.  NG tube placed.  General surgery consulted and following  Assessment & Plan:   SBO: CT scan showed functional low-grade SBO with large amount of stool in the colon.Showed  distended and fluid-filled stomach with fluid extending into the distal thoracic esophagus.  Mildly dilated duodenum and proximal jejunum with gradual transition point in the mid abdomen. Started on conservative management after admitted for the management of low-grade SBO.  NG tube placed. Continue IV fluids, antiemetics. Patient denies any abdomen pain, nausea or vomiting.  NG tube draining dark fluid.  He is passing gas.  He has good bowel sounds. Follow-up abdominal x-ray today showed persistent SBO.  General surgery consulted.   Leukocytosis: Unclear etiology, resolved, most likely reactive.   Hypokalemia/hyponatremia: Continue to monitor and supplement if needed   History of spinal cord ependymoma: S/p laminectomy.  Has chronic contractures and decreased ambulation/mostly bed bound.  PT consulted here,recommended HH   History of psoriasis        DVT prophylaxis:enoxaparin (LOVENOX) injection 40 mg Start: 12/29/22 1800     Code Status: Full Code  Family Communication: Discussed with mother at bedside on 2/25  Patient status: Inpatient  Patient is from : Home  Anticipated discharge to:  Home  Estimated DC date: After resolution of bowel obstruction   Consultants: General surgery  Procedures: None  Antimicrobials:  Anti-infectives (From admission, onward)    None       Subjective: Patient seen and examined at bedside today.  Hemodynamically stable.  NG tube draining dark fluid.  Denies any abdomen pain, nausea or vomiting.  Passing gas but no bowel movement yet  Objective: Vitals:   12/30/22 0118 12/30/22 0400 12/30/22 0520 12/30/22 0800  BP: 111/81  (!) 107/96   Pulse: (!) 110  (!) 107   Resp: 16  16   Temp: 100.3 F (37.9 C) 100 F (37.8 C) 99.4 F (37.4 C) 99.6 F (37.6 C)  TempSrc:    Oral  SpO2: 98%  95%   Weight:      Height:        Intake/Output Summary (Last 24 hours) at 12/30/2022 1158 Last data filed at 12/30/2022 1000 Gross per 24 hour  Intake 192.87 ml  Output 1800 ml  Net -1607.13 ml   Filed Weights   12/28/22 2326  Weight: 90.7 kg    Examination:  General exam: Overall comfortable, not in distress HEENT: PERRL,NG tube Respiratory system:  no wheezes or crackles  Cardiovascular system: S1 & S2 heard, RRR.  Gastrointestinal system: Abdomen is nondistended, soft and nontender.  Bowel sounds present Central nervous system: Alert and oriented Extremities: Contractures of extremities Skin: Erythematous rash on the face   Data Reviewed: I have personally reviewed following labs and imaging studies  CBC: Recent Labs  Lab 12/29/22 0147 12/29/22 0744 12/30/22 0538  WBC 17.3* 15.0* 8.9  NEUTROABS 14.8*  --   --  HGB 14.7 13.7 14.2  HCT 45.3 42.5 44.9  MCV 82.4 82.5 83.9  PLT 213 205 [id number]   Basic Metabolic Panel: Recent Labs  Lab 12/29/22 0147 12/29/22 0744 12/30/22 0334  NA 132* 132* 133*  K 3.4* 3.8 4.0  CL 99 100 97*  CO2 20* 20* 24  GLUCOSE 138* 115* 81  BUN 31* 24* 23*  CREATININE 0.65 0.67  0.59* 0.83  CALCIUM 8.9 8.7* 8.6*     Recent Results (from the past 240 hour(s))  Blood culture (routine x 2)      Status: Abnormal (Preliminary result)   Collection Time: 12/29/22  3:22 AM   Specimen: BLOOD RIGHT HAND  Result Value Ref Range Status   Specimen Description   Final    BLOOD RIGHT HAND BOTTLES DRAWN AEROBIC AND ANAEROBIC Performed at [facility], [place] [address], [place], [place] [address]    Special Requests   Final    Blood Culture adequate volume Performed at [place] [address] [place], [place] [address]    Culture  Setup Time   Final    GRAM POSITIVE COCCI IN CLUSTERS IN BOTH AEROBIC AND ANAEROBIC BOTTLES CRITICAL RESULT CALLED TO, READ BACK BY AND VERIFIED WITH: [place] 12/30/22 @ 0009 BY AB    Culture (A)  Final    STAPHYLOCOCCUS EPIDERMIDIS THE SIGNIFICANCE OF ISOLATING THIS ORGANISM FROM A SINGLE SET OF BLOOD CULTURES WHEN MULTIPLE SETS ARE DRAWN IS UNCERTAIN. PLEASE NOTIFY THE MICROBIOLOGY DEPARTMENT WITHIN ONE WEEK IF SPECIATION AND SENSITIVITIES ARE REQUIRED. Performed at [facility], [place] [address] [place], [place] [address]    Report Status PENDING  Incomplete  Blood Culture ID Panel (Reflexed)     Status: Abnormal   Collection Time: 12/29/22  3:22 AM  Result Value Ref Range Status   Enterococcus faecalis NOT DETECTED NOT DETECTED Final   Enterococcus Faecium NOT DETECTED NOT DETECTED Final   Listeria monocytogenes NOT DETECTED NOT DETECTED Final   Staphylococcus species DETECTED (A) NOT DETECTED Final    Comment: CRITICAL RESULT CALLED TO, READ BACK BY AND VERIFIED WITH: PHARMD M. LILLISTON 12/30/22 @ 0009 BY AB    Staphylococcus aureus (BCID) NOT DETECTED NOT DETECTED Final   Staphylococcus epidermidis DETECTED (A) NOT DETECTED Final    Comment: CRITICAL RESULT CALLED TO, READ BACK BY AND VERIFIED WITH: PHARMD M. LILLISTON 12/30/22 @ 0009 BY AB    Staphylococcus lugdunensis NOT DETECTED NOT DETECTED Final   Streptococcus species NOT DETECTED NOT DETECTED Final   Streptococcus agalactiae NOT  DETECTED NOT DETECTED Final   Streptococcus pneumoniae NOT DETECTED NOT DETECTED Final   Streptococcus pyogenes NOT DETECTED NOT DETECTED Final   A.calcoaceticus-baumannii NOT DETECTED NOT DETECTED Final   Bacteroides fragilis NOT DETECTED NOT DETECTED Final   Enterobacterales NOT DETECTED NOT DETECTED Final   Enterobacter cloacae complex NOT DETECTED NOT DETECTED Final   Escherichia coli NOT DETECTED NOT DETECTED Final   Klebsiella aerogenes NOT DETECTED NOT DETECTED Final   Klebsiella oxytoca NOT DETECTED NOT DETECTED Final   Klebsiella pneumoniae NOT DETECTED NOT DETECTED Final   Proteus species NOT DETECTED NOT DETECTED Final   Salmonella species NOT DETECTED NOT DETECTED Final   Serratia marcescens NOT DETECTED NOT DETECTED Final   Haemophilus influenzae NOT DETECTED NOT DETECTED Final   Neisseria meningitidis NOT DETECTED NOT DETECTED Final   Pseudomonas aeruginosa NOT DETECTED NOT DETECTED Final   Stenotrophomonas maltophilia NOT DETECTED NOT DETECTED Final   Candida  albicans NOT DETECTED NOT DETECTED Final   Candida auris NOT DETECTED NOT DETECTED Final   Candida glabrata NOT DETECTED NOT DETECTED Final   Candida krusei NOT DETECTED NOT DETECTED Final   Candida parapsilosis NOT DETECTED NOT DETECTED Final   Candida tropicalis NOT DETECTED NOT DETECTED Final   Cryptococcus neoformans/gattii NOT DETECTED NOT DETECTED Final   Methicillin resistance mecA/C NOT DETECTED NOT DETECTED Final    Comment: Performed at [facility], [place] [address] [place], [place] [address]  Blood culture (routine x 2)     Status: None (Preliminary result)   Collection Time: 12/29/22  3:33 AM   Specimen: Right Antecubital; Blood  Result Value Ref Range Status   Specimen Description RIGHT ANTECUBITAL BLOOD  Final   Special Requests   Final    Blood Culture results may not be optimal due to an inadequate volume of blood received in culture bottles BOTTLES DRAWN AEROBIC AND ANAEROBIC   Culture    Final    NO GROWTH 1 DAY Performed at [facility], [place] [address], [place], [place] [address]    Report Status PENDING  Incomplete     Radiology Studies: DG Abd 1 View  Result Date: 12/30/2022 CLINICAL DATA:  [id number] Small bowel obstruction ([place]) [id number] EXAM: ABDOMEN - 1 VIEW COMPARISON:  CT 12/29/2022 FINDINGS: There is persistent small bowel dilation with normal caliber colon. Nasogastric tube side port overlies the region of the GE junction, tip overlies the stomach. The stomach is distended. Moderate stool burden in the colon. IMPRESSION: Persistent small bowel obstruction. Nasogastric tube side port overlies the region of the GE junction, recommend advancement by 6.0 cm. Electronically Signed   By: Maurine Simmering M.D.   On: 12/30/2022 07:59   DG Abdomen 1 View  Result Date: 12/29/2022 CLINICAL DATA:  57 year old male status post nasogastric tube placement. EXAM: ABDOMEN - 1 VIEW COMPARISON:  Abdominal radiograph 12/29/2022. FINDINGS: Nasogastric tube has been advanced, now with the side port proximally 5.5 cm distal to the gastroesophageal junction. Persistent distention of the stomach is noted in the visualized upper abdomen. Abdomen is partially imaged. Areas of atelectasis and/or scarring noted in the lung bases. IMPRESSION: Nasogastric tube appears appropriately located. Electronically Signed   By: Vinnie Langton M.D.   On: 12/29/2022 06:02   DG Abdomen 1 View  Result Date: 12/29/2022 CLINICAL DATA:  57 year old male status post nasogastric tube placement. EXAM: ABDOMEN - 1 VIEW COMPARISON:  Chest x-ray 12/29/2022. FINDINGS: Nasogastric tube tip is in the proximal stomach, with side port in the distal esophagus. Irregular opacities are noted in the lung bases bilaterally, likely to reflect areas of scarring and/or atelectasis. Abdomen is incompletely imaged, but visualized upper abdomen demonstrates gaseous distention of the stomach. IMPRESSION: 1. Nasogastric tube tip is in the  proximal stomach, but side port remains in the distal esophagus. The tube should be advanced at least 10 cm for more optimal placement. Electronically Signed   By: Vinnie Langton M.D.   On: 12/29/2022 05:30   CT ABDOMEN PELVIS W CONTRAST  Addendum Date: 12/29/2022   ADDENDUM REPORT: 12/29/2022 03:46 ADDENDUM: Jejunal transition point best visualized on series 2, image 50. This may indicate a low-grade or functional small-bowel obstruction. The distal small bowel is decompressed. Large amount of stool in the colon. Electronically Signed   By: Ulyses Jarred M.D.   On: 12/29/2022 03:46   Result Date: 12/29/2022 CLINICAL DATA:  Small-bowel obstruction EXAM: CT ABDOMEN AND PELVIS WITH CONTRAST  TECHNIQUE: Multidetector CT imaging of the abdomen and pelvis was performed using the standard protocol following bolus administration of intravenous contrast. RADIATION DOSE REDUCTION: This exam was performed according to the departmental dose-optimization program which includes automated exposure control, adjustment of the mA and/or kV according to patient size and/or use of iterative reconstruction technique. CONTRAST:  132m OMNIPAQUE IOHEXOL 300 MG/ML  SOLN COMPARISON:  None Available. FINDINGS: Lower Chest: Normal. Hepatobiliary: Normal hepatic contours. No intra- or extrahepatic biliary dilatation. The gallbladder is normal. Pancreas: Normal pancreas. No ductal dilatation or peripancreatic fluid collection. Spleen: Normal. Adrenals/Urinary Tract: The adrenal glands are normal. No hydronephrosis, nephroureterolithiasis or solid renal mass. The urinary bladder is normal for degree of distention Stomach/Bowel: Distended and fluid-filled stomach with fluid extending into the distal thoracic esophagus. Mildly dilated duodenum and proximal jejunum. Gradual transition in the mid abdomen. No focal colonic abnormality. Normal appendix. Vascular/Lymphatic: Normal course and caliber of the major abdominal vessels. No abdominal  or pelvic lymphadenopathy. Reproductive: Normal prostate size with symmetric seminal vesicles. Other: None. Musculoskeletal: No bony spinal canal stenosis or focal osseous abnormality. IMPRESSION: 1. Distended and fluid-filled stomach with fluid extending into the distal thoracic esophagus. 2. Mildly dilated duodenum and proximal jejunum with gradual transition point in the mid abdomen. Electronically Signed: By: KUlyses JarredM.D. On: 12/29/2022 03:18   DG Abdomen Acute W/Chest  Result Date: 12/29/2022 CLINICAL DATA:  Shortness of breath and abdominal distention EXAM: DG ABDOMEN ACUTE WITH 1 VIEW CHEST COMPARISON:  None Available. FINDINGS: Atelectasis or scarring in the right hilum and lower lung. Hyperinflation of the left lung. No pleural effusion or pneumothorax. Normal cardiomediastinal silhouette. No acute osseous abnormality. Marked gaseous distention of the stomach. Gaseous dilation of multiple loops of small bowel in the central abdomen measuring up to 6.3 cm in diameter. Gas-filled colon without definite dilation. IMPRESSION: Marked gaseous dilation of the stomach and small bowel. CT is recommended to evaluate for obstruction. Atelectasis or scarring in the right mid and lower lung. Hyperinflation of the left lung. Electronically Signed   By: TPlacido SouM.D.   On: 12/29/2022 01:44    Scheduled Meds:  acetaminophen  650 mg Rectal Once   bisacodyl  10 mg Rectal Once   diatrizoate meglumine-sodium  90 mL Per NG tube Once   enoxaparin (LOVENOX) injection  40 mg Subcutaneous Q24H   sodium chloride flush  3 mL Intravenous Q12H   Continuous Infusions:  sodium chloride 100 mL/hr at 12/30/22 0804     LOS: 1 day   AShelly Coss MD [facility] P2/26/2024, 11:58 AM

## 2022-12-30 NOTE — Progress Notes (Signed)
PHARMACY - PHYSICIAN COMMUNICATION CRITICAL VALUE ALERT - BLOOD CULTURE IDENTIFICATION (BCID)  Carlos [name] is an 57 y.o. male who presented to [facility] on 12/28/2022 with a chief complaint of constipation  Assessment:   Patient with recent surgery admitted with SBO.  Not currently on antibiotics and no s/sx of infection.  Anaerobic bottle of 1 set now + GPC; BCID + MSSE.   Noted that inadequate volume of blood collected so likely contaminant.   Name of physician (or Provider) Contacted: Clarene Essex  Current antibiotics: none  Changes to prescribed antibiotics recommended:  None Continue to monitor for s/sx of infection and consider re-culture/ start antibiotics if present  Results for orders placed or performed during the hospital encounter of 12/28/22  Blood Culture ID Panel (Reflexed) (Collected: 12/29/2022  3:22 AM)  Result Value Ref Range   Enterococcus faecalis NOT DETECTED NOT DETECTED   Enterococcus Faecium NOT DETECTED NOT DETECTED   Listeria monocytogenes NOT DETECTED NOT DETECTED   Staphylococcus species DETECTED (A) NOT DETECTED   Staphylococcus aureus (BCID) NOT DETECTED NOT DETECTED   Staphylococcus epidermidis DETECTED (A) NOT DETECTED   Staphylococcus lugdunensis NOT DETECTED NOT DETECTED   Streptococcus species NOT DETECTED NOT DETECTED   Streptococcus agalactiae NOT DETECTED NOT DETECTED   Streptococcus pneumoniae NOT DETECTED NOT DETECTED   Streptococcus pyogenes NOT DETECTED NOT DETECTED   A.calcoaceticus-baumannii NOT DETECTED NOT DETECTED   Bacteroides fragilis NOT DETECTED NOT DETECTED   Enterobacterales NOT DETECTED NOT DETECTED   Enterobacter cloacae complex NOT DETECTED NOT DETECTED   Escherichia coli NOT DETECTED NOT DETECTED   Klebsiella aerogenes NOT DETECTED NOT DETECTED   Klebsiella oxytoca NOT DETECTED NOT DETECTED   Klebsiella pneumoniae NOT DETECTED NOT DETECTED   Proteus species NOT DETECTED NOT DETECTED   Salmonella species NOT DETECTED NOT  DETECTED   Serratia marcescens NOT DETECTED NOT DETECTED   Haemophilus influenzae NOT DETECTED NOT DETECTED   Neisseria meningitidis NOT DETECTED NOT DETECTED   Pseudomonas aeruginosa NOT DETECTED NOT DETECTED   Stenotrophomonas maltophilia NOT DETECTED NOT DETECTED   Candida albicans NOT DETECTED NOT DETECTED   Candida auris NOT DETECTED NOT DETECTED   Candida glabrata NOT DETECTED NOT DETECTED   Candida krusei NOT DETECTED NOT DETECTED   Candida parapsilosis NOT DETECTED NOT DETECTED   Candida tropicalis NOT DETECTED NOT DETECTED   Cryptococcus neoformans/gattii NOT DETECTED NOT DETECTED   Methicillin resistance mecA/C NOT DETECTED NOT DETECTED    Netta Cedars PharmD 12/30/2022  12:16 AM

## 2022-12-30 NOTE — Consult Note (Signed)
[facility] Surgery Consult Note  Carlos [name] 11/14/1965  PR:[id number].    Requesting MD: Shelly Coss Chief Complaint/Reason for Consult: SBO  HPI:  Carlos [name] is a 57 y.o. male with h/o spinal ependymoma status post surgery, bedbound status, who was admitted to [facility] yesterday with abdominal pain, nausea, and vomiting. States that he has had symptoms for about 4 days. Pain was diffuse about his abdomen. This started after he developed cold-like symptoms about 4 days ago and he tried drinking some orange juice. This caused acid reflux followed by nausea and vomiting. Given ongoing symptoms he decided to come to the ED. States that he typically only has a bowel movement about once weekly; he is not on a consistent bowel regimen at home. States that he is passing some flatus today and he had a small BM yesterday. CT scan on admission reports transition point in the jejunum concerning for a low-grade or functional small-bowel obstruction; the distal small bowel is decompressed; large amount of stool in the colon; distended and fluid-filled stomach with fluid extending into the distal thoracic esophagus. NG tube was placed and patient was admitted to the medical service. General surgery asked to see.  He has no prior h/o abdominal surgery. He has never had an SBO before.  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 He has never used smokeless tobacco. He reports current alcohol use of about 2.0 standard drinks of alcohol per week. He reports that he does not use drugs.  Allergies:  Allergies  Allergen Reactions   Bactrim [Sulfamethoxazole-Trimethoprim] Hives and Rash    Medications Prior to Admission  Medication Sig Dispense Refill   acetaminophen (TYLENOL) 325 MG tablet Take 2 tablets (650 mg total) by mouth every 6 (six) hours as needed for mild pain (or Fever >/= 101).     polyethylene glycol (MIRALAX / GLYCOLAX) 17 g packet Take 17 g by mouth daily as needed for mild constipation.     senna (SENOKOT) 8.6 MG tablet Take 1 tablet by mouth daily.     baclofen (LIORESAL) 10 MG tablet Take 10 mg by mouth 3 (three) times daily. (Patient not taking: Reported on 12/29/2022)      Prior to Admission medications   Medication Sig Start Date End Date Taking? Authorizing Provider  acetaminophen (TYLENOL) 325 MG tablet Take 2 tablets (650 mg total) by mouth every 6 (six) hours as needed for mild pain (or Fever >/= 101). 09/27/19  Yes Nita Sells, MD  polyethylene glycol (MIRALAX / GLYCOLAX) 17 g packet Take 17 g by mouth daily as needed for mild constipation.   Yes [provider]  senna (SENOKOT) 8.6 MG tablet Take 1 tablet by mouth  daily.   Yes [provider]  baclofen (LIORESAL) 10 MG tablet Take 10 mg by mouth 3 (three) times daily. Patient not taking: Reported on 12/29/2022 07/05/21   Myrle Sheng, MD    Blood pressure (!) 107/96, pulse (!) 107, temperature 99.6 F (37.6 C), temperature source Oral, resp. rate 16, height '6\' 4"'$  (1.93 m), weight 90.7 kg, SpO2 95 %. Physical Exam: General: pleasant, chronically ill appearing male who is laying in bed in NAD HEENT: head is normocephalic, atraumatic.  Sclera are noninjected.   Pupils equal and round.  Ears and nose without any masses or lesions.  Mouth is pink and moist. Dentition fair Heart: regular, rate, and rhythm Lungs: CTAB, no wheezes, rhonchi, or rales noted.  Respiratory effort non-labored on room air Abd: soft, mild distension, nontender, +BS, no masses, hernias, or organomegaly MS: BUE contractures present Skin: warm and dry with no masses, lesions, or rashes  Results for orders placed or performed during the hospital encounter of 12/28/22 (from the past 48 hour(s))  Comprehensive metabolic panel     Status: Abnormal   Collection Time: 12/29/22  1:47 AM  Result Value Ref Range   Sodium 132 (L) 135 - 145 mmol/L   Potassium 3.4 (L) 3.5 - 5.1 mmol/L   Chloride 99 98 - 111 mmol/L   CO2 20 (L) 22 - 32 mmol/L   Glucose, Bld 138 (H) 70 - 99 mg/dL    Comment: Glucose reference range applies only to samples taken after fasting for at least 8 hours.   BUN 31 (H) 6 - 20 mg/dL   Creatinine, Ser 0.65 0.61 - 1.24 mg/dL   Calcium 8.9 8.9 - 10.3 mg/dL   Total Protein 7.2 6.5 - 8.1 g/dL   Albumin 3.9 3.5 - 5.0 g/dL   AST 16 15 - 41 U/L   ALT 14 0 - 44 U/L   Alkaline Phosphatase 50 38 - 126 U/L   Total Bilirubin 0.7 0.3 - 1.2 mg/dL   GFR, Estimated >60 >60 mL/min    Comment: (NOTE) Calculated using the CKD-EPI Creatinine Equation (2021)    Anion gap 13 5 - 15    Comment: Performed at [facility], [place] [address], [place], [place] [address]  CBC with Differential     Status: Abnormal   Collection Time: 12/29/22  1:47 AM  Result Value Ref Range   WBC 17.3 (H) 4.0 - 10.5 K/uL   RBC 5.50 4.22 - 5.81 MIL/uL   Hemoglobin 14.7 13.0 - 17.0 g/dL   HCT 45.3 39.0 - 52.0 %   MCV 82.4 80.0 - 100.0 fL   MCH 26.7 26.0 - 34.0 pg   MCHC 32.5 30.0 - 36.0 g/dL   RDW 13.3 11.5 - 15.5 %   Platelets 213 150 - 400 K/uL   nRBC 0.0 0.0 - 0.2 %   Neutrophils Relative % 86 %   Neutro Abs 14.8 (H) 1.7 - 7.7 K/uL   Lymphocytes Relative 5 %   Lymphs Abs 0.9  0.7 - 4.0 K/uL   Monocytes Relative 8 %   Monocytes Absolute 1.4 (H) 0.1 - 1.0 K/uL   Eosinophils Relative 0 %   Eosinophils Absolute 0.1 0.0 - 0.5 K/uL   Basophils Relative 0 %   Basophils Absolute 0.1 0.0 - 0.1 K/uL   Immature Granulocytes 1 %   Abs Immature Granulocytes 0.13 (H) 0.00 - 0.07 K/uL    Comment: Performed at [facility], [place] [address]  [place], [place] [address]  Lactic acid, plasma     Status: None   Collection Time: 12/29/22  1:47 AM  Result Value Ref Range   Lactic Acid, Venous 1.4 0.5 - 1.9 mmol/L    Comment: Performed at [facility], [place] [address] [place], [place] [address]  Lipase, blood     Status: None   Collection Time: 12/29/22  1:47 AM  Result Value Ref Range   Lipase 31 11 - 51 U/L    Comment: Performed at [facility], [place] [address], [place], [place] [address]  Troponin I (High Sensitivity)     Status: None   Collection Time: 12/29/22  1:47 AM  Result Value Ref Range   Troponin I (High Sensitivity) <2 <18 ng/L    Comment: (NOTE) Elevated high sensitivity troponin I (hsTnI) values and significant  changes across serial measurements may suggest ACS but many other  chronic and acute conditions are known to elevate hsTnI results.  Refer to the "Links" section for chest pain algorithms and additional  guidance. Performed at [facility], [place] [address] [place], [place] [address]   Lactic acid, plasma     Status: None   Collection Time: 12/29/22  3:22 AM  Result Value Ref Range   Lactic Acid, Venous 1.2 0.5 - 1.9 mmol/L    Comment: Performed at [facility], [place] [address] [place], [place] [address]  Blood culture (routine x 2)     Status: None (Preliminary result)   Collection Time: 12/29/22  3:22 AM   Specimen: BLOOD RIGHT HAND  Result Value Ref Range   Specimen Description      BLOOD RIGHT HAND BOTTLES DRAWN AEROBIC AND ANAEROBIC Performed at  [place] [address], [place], [place] [address]    Special Requests      Blood Culture adequate volume Performed at [place] [address] [place], [place] [address]    Culture  Setup Time      GRAM POSITIVE COCCI IN CLUSTERS IN BOTH AEROBIC AND ANAEROBIC BOTTLES CRITICAL RESULT CALLED TO, READ BACK BY AND VERIFIED WITH: PHARMD M. Irven Easterly 12/30/22 @ 0009 BY AB Performed at [facility], [place] [address] [place] [address], [place], [place] [address]    Culture GRAM POSITIVE COCCI    Report Status PENDING   Troponin I (High Sensitivity)     Status: None   Collection Time: 12/29/22  3:22 AM  Result Value Ref Range   Troponin I (High Sensitivity) <2 <18 ng/L    Comment: (NOTE) Elevated high sensitivity troponin I (hsTnI) values and significant  changes across serial measurements may suggest ACS but many other  chronic and acute conditions are known to elevate hsTnI results.  Refer to the "Links" section for chest pain algorithms and additional  guidance. Performed at [facility], [place] [address], [place], [place] [address]   Blood Culture ID Panel (Reflexed)     Status: Abnormal   Collection Time: 12/29/22  3:22 AM  Result Value Ref Range   Enterococcus faecalis NOT DETECTED NOT DETECTED   Enterococcus Faecium NOT DETECTED NOT DETECTED   Listeria monocytogenes NOT DETECTED NOT DETECTED   Staphylococcus species DETECTED (A) NOT DETECTED    Comment: CRITICAL RESULT CALLED TO, READ BACK BY AND VERIFIED WITH: PHARMD M. LILLISTON 12/30/22 @ 0009 BY AB    Staphylococcus aureus (BCID) NOT DETECTED NOT DETECTED   Staphylococcus epidermidis DETECTED (A) NOT DETECTED  Comment: CRITICAL RESULT CALLED TO, READ BACK BY AND VERIFIED WITH: PHARMD M. LILLISTON 12/30/22 @ 0009 BY AB    Staphylococcus lugdunensis NOT DETECTED NOT DETECTED   Streptococcus species NOT DETECTED NOT DETECTED   Streptococcus agalactiae NOT DETECTED NOT  DETECTED   Streptococcus pneumoniae NOT DETECTED NOT DETECTED   Streptococcus pyogenes NOT DETECTED NOT DETECTED   A.calcoaceticus-baumannii NOT DETECTED NOT DETECTED   Bacteroides fragilis NOT DETECTED NOT DETECTED   Enterobacterales NOT DETECTED NOT DETECTED   Enterobacter cloacae complex NOT DETECTED NOT DETECTED   Escherichia coli NOT DETECTED NOT DETECTED   Klebsiella aerogenes NOT DETECTED NOT DETECTED   Klebsiella oxytoca NOT DETECTED NOT DETECTED   Klebsiella pneumoniae NOT DETECTED NOT DETECTED   Proteus species NOT DETECTED NOT DETECTED   Salmonella species NOT DETECTED NOT DETECTED   Serratia marcescens NOT DETECTED NOT DETECTED   Haemophilus influenzae NOT DETECTED NOT DETECTED   Neisseria meningitidis NOT DETECTED NOT DETECTED   Pseudomonas aeruginosa NOT DETECTED NOT DETECTED   Stenotrophomonas maltophilia NOT DETECTED NOT DETECTED   Candida albicans NOT DETECTED NOT DETECTED   Candida auris NOT DETECTED NOT DETECTED   Candida glabrata NOT DETECTED NOT DETECTED   Candida krusei NOT DETECTED NOT DETECTED   Candida parapsilosis NOT DETECTED NOT DETECTED   Candida tropicalis NOT DETECTED NOT DETECTED   Cryptococcus neoformans/gattii NOT DETECTED NOT DETECTED   Methicillin resistance mecA/C NOT DETECTED NOT DETECTED    Comment: Performed at [facility], [place] [address] [place], [place] [address]  Blood culture (routine x 2)     Status: None (Preliminary result)   Collection Time: 12/29/22  3:33 AM   Specimen: Right Antecubital; Blood  Result Value Ref Range   Specimen Description RIGHT ANTECUBITAL BLOOD    Special Requests      Blood Culture results may not be optimal due to an inadequate volume of blood received in culture bottles BOTTLES DRAWN AEROBIC AND ANAEROBIC Performed at [place] [address] [place], [place] [address]    Culture PENDING    Report Status PENDING   Urinalysis, Routine w reflex microscopic -Urine, Clean Catch     Status:  Abnormal   Collection Time: 12/29/22  4:29 AM  Result Value Ref Range   Color, Urine YELLOW YELLOW   APPearance CLEAR CLEAR   Specific Gravity, Urine >1.046 (H) 1.005 - 1.030   pH 5.0 5.0 - 8.0   Glucose, UA NEGATIVE NEGATIVE mg/dL   Hgb urine dipstick NEGATIVE NEGATIVE   Bilirubin Urine NEGATIVE NEGATIVE   Ketones, ur 20 (A) NEGATIVE mg/dL   Protein, ur 30 (A) NEGATIVE mg/dL   Nitrite NEGATIVE NEGATIVE   Leukocytes,Ua NEGATIVE NEGATIVE   RBC / HPF 0-5 0 - 5 RBC/hpf   WBC, UA 0-5 0 - 5 WBC/hpf   Bacteria, UA RARE (A) NONE SEEN   Squamous Epithelial / HPF 0-5 0 - 5 /HPF   Mucus PRESENT     Comment: Performed at [facility], [place] [address], [place], [place] [address]  HIV Antibody (routine testing w rflx)     Status: None   Collection Time: 12/29/22  7:44 AM  Result Value Ref Range   HIV Screen 4th Generation wRfx Non Reactive Non Reactive    Comment: Performed at [facility], [place] [address] [place], [place] [address]  CBC     Status: Abnormal   Collection Time: 12/29/22  7:44 AM  Result Value Ref Range  WBC 15.0 (H) 4.0 - 10.5 K/uL   RBC 5.15 4.22 - 5.81 MIL/uL   Hemoglobin 13.7 13.0 - 17.0 g/dL   HCT 42.5 39.0 - 52.0 %   MCV 82.5 80.0 - 100.0 fL   MCH 26.6 26.0 - 34.0 pg   MCHC 32.2 30.0 - 36.0 g/dL   RDW 13.3 11.5 - 15.5 %   Platelets 205 150 - 400 K/uL   nRBC 0.0 0.0 - 0.2 %    Comment: Performed at [facility], [place] [address], [place], [place] [address]  Creatinine, serum     Status: Abnormal   Collection Time: 12/29/22  7:44 AM  Result Value Ref Range   Creatinine, Ser 0.59 (L) 0.61 - 1.24 mg/dL   GFR, Estimated >60 >60 mL/min    Comment: (NOTE) Calculated using the CKD-EPI Creatinine Equation (2021) Performed at [facility], [place] [address], [place], [place] [id number]   Basic metabolic panel     Status: Abnormal   Collection Time: 12/29/22  7:44 AM  Result Value Ref Range   Sodium 132 (L) 135  - 145 mmol/L   Potassium 3.8 3.5 - 5.1 mmol/L   Chloride 100 98 - 111 mmol/L   CO2 20 (L) 22 - 32 mmol/L   Glucose, Bld 115 (H) 70 - 99 mg/dL    Comment: Glucose reference range applies only to samples taken after fasting for at least 8 hours.   BUN 24 (H) 6 - 20 mg/dL   Creatinine, Ser 0.67 0.61 - 1.24 mg/dL   Calcium 8.7 (L) 8.9 - 10.3 mg/dL   GFR, Estimated >60 >60 mL/min    Comment: (NOTE) Calculated using the CKD-EPI Creatinine Equation (2021)    Anion gap 12 5 - 15    Comment: Performed at [facility], [place] [address] [place], [place] [id number]  Basic metabolic panel     Status: Abnormal   Collection Time: 12/30/22  3:34 AM  Result Value Ref Range   Sodium 133 (L) 135 - 145 mmol/L   Potassium 4.0 3.5 - 5.1 mmol/L   Chloride 97 (L) 98 - 111 mmol/L   CO2 24 22 - 32 mmol/L   Glucose, Bld 81 70 - 99 mg/dL    Comment: Glucose reference range applies only to samples taken after fasting for at least 8 hours.   BUN 23 (H) 6 - 20 mg/dL   Creatinine, Ser 0.83 0.61 - 1.24 mg/dL   Calcium 8.6 (L) 8.9 - 10.3 mg/dL   GFR, Estimated >60 >60 mL/min    Comment: (NOTE) Calculated using the CKD-EPI Creatinine Equation (2021)    Anion gap 12 5 - 15    Comment: Performed at [facility], [place] [address], [place], [place] [address]  CBC     Status: None   Collection Time: 12/30/22  5:38 AM  Result Value Ref Range   WBC 8.9 4.0 - 10.5 K/uL   RBC 5.35 4.22 - 5.81 MIL/uL   Hemoglobin 14.2 13.0 - 17.0 g/dL   HCT 44.9 39.0 - 52.0 %   MCV 83.9 80.0 - 100.0 fL   MCH 26.5 26.0 - 34.0 pg   MCHC 31.6 30.0 - 36.0 g/dL   RDW 13.5 11.5 - 15.5 %   Platelets 183 150 - 400 K/uL   nRBC 0.0 0.0 - 0.2 %    Comment: Performed at [facility], [place] [address], [place],  [address]   DG Abd 1  View  Result Date: 12/30/2022 CLINICAL DATA:  [id number] Small bowel obstruction ([place]) [id number] EXAM: ABDOMEN - 1 VIEW COMPARISON:  CT 12/29/2022 FINDINGS:  There is persistent small bowel dilation with normal caliber colon. Nasogastric tube side port overlies the region of the GE junction, tip overlies the stomach. The stomach is distended. Moderate stool burden in the colon. IMPRESSION: Persistent small bowel obstruction. Nasogastric tube side port overlies the region of the GE junction, recommend advancement by 6.0 cm. Electronically Signed   By: Maurine Simmering M.D.   On: 12/30/2022 07:59   DG Abdomen 1 View  Result Date: 12/29/2022 CLINICAL DATA:  57 year old male status post nasogastric tube placement. EXAM: ABDOMEN - 1 VIEW COMPARISON:  Abdominal radiograph 12/29/2022. FINDINGS: Nasogastric tube has been advanced, now with the side port proximally 5.5 cm distal to the gastroesophageal junction. Persistent distention of the stomach is noted in the visualized upper abdomen. Abdomen is partially imaged. Areas of atelectasis and/or scarring noted in the lung bases. IMPRESSION: Nasogastric tube appears appropriately located. Electronically Signed   By: Vinnie Langton M.D.   On: 12/29/2022 06:02   DG Abdomen 1 View  Result Date: 12/29/2022 CLINICAL DATA:  57 year old male status post nasogastric tube placement. EXAM: ABDOMEN - 1 VIEW COMPARISON:  Chest x-ray 12/29/2022. FINDINGS: Nasogastric tube tip is in the proximal stomach, with side port in the distal esophagus. Irregular opacities are noted in the lung bases bilaterally, likely to reflect areas of scarring and/or atelectasis. Abdomen is incompletely imaged, but visualized upper abdomen demonstrates gaseous distention of the stomach. IMPRESSION: 1. Nasogastric tube tip is in the proximal stomach, but side port remains in the distal esophagus. The tube should be advanced at least 10 cm for more optimal placement. Electronically Signed   By: Vinnie Langton M.D.   On: 12/29/2022 05:30   CT ABDOMEN PELVIS W CONTRAST  Addendum Date: 12/29/2022   ADDENDUM REPORT: 12/29/2022 03:46 ADDENDUM: Jejunal transition  point best visualized on series 2, image 50. This may indicate a low-grade or functional small-bowel obstruction. The distal small bowel is decompressed. Large amount of stool in the colon. Electronically Signed   By: Ulyses Jarred M.D.   On: 12/29/2022 03:46   Result Date: 12/29/2022 CLINICAL DATA:  Small-bowel obstruction EXAM: CT ABDOMEN AND PELVIS WITH CONTRAST TECHNIQUE: Multidetector CT imaging of the abdomen and pelvis was performed using the standard protocol following bolus administration of intravenous contrast. RADIATION DOSE REDUCTION: This exam was performed according to the departmental dose-optimization program which includes automated exposure control, adjustment of the mA and/or kV according to patient size and/or use of iterative reconstruction technique. CONTRAST:  151m OMNIPAQUE IOHEXOL 300 MG/ML  SOLN COMPARISON:  None Available. FINDINGS: Lower Chest: Normal. Hepatobiliary: Normal hepatic contours. No intra- or extrahepatic biliary dilatation. The gallbladder is normal. Pancreas: Normal pancreas. No ductal dilatation or peripancreatic fluid collection. Spleen: Normal. Adrenals/Urinary Tract: The adrenal glands are normal. No hydronephrosis, nephroureterolithiasis or solid renal mass. The urinary bladder is normal for degree of distention Stomach/Bowel: Distended and fluid-filled stomach with fluid extending into the distal thoracic esophagus. Mildly dilated duodenum and proximal jejunum. Gradual transition in the mid abdomen. No focal colonic abnormality. Normal appendix. Vascular/Lymphatic: Normal course and caliber of the major abdominal vessels. No abdominal or pelvic lymphadenopathy. Reproductive: Normal prostate size with symmetric seminal vesicles. Other: None. Musculoskeletal: No bony spinal canal stenosis or focal osseous abnormality. IMPRESSION: 1. Distended and fluid-filled stomach with fluid extending into the distal thoracic esophagus. 2. Mildly dilated duodenum and  proximal  jejunum with gradual transition point in the mid abdomen. Electronically Signed: By: Ulyses Jarred M.D. On: 12/29/2022 03:18   DG Abdomen Acute W/Chest  Result Date: 12/29/2022 CLINICAL DATA:  Shortness of breath and abdominal distention EXAM: DG ABDOMEN ACUTE WITH 1 VIEW CHEST COMPARISON:  None Available. FINDINGS: Atelectasis or scarring in the right hilum and lower lung. Hyperinflation of the left lung. No pleural effusion or pneumothorax. Normal cardiomediastinal silhouette. No acute osseous abnormality. Marked gaseous distention of the stomach. Gaseous dilation of multiple loops of small bowel in the central abdomen measuring up to 6.3 cm in diameter. Gas-filled colon without definite dilation. IMPRESSION: Marked gaseous dilation of the stomach and small bowel. CT is recommended to evaluate for obstruction. Atelectasis or scarring in the right mid and lower lung. Hyperinflation of the left lung. Electronically Signed   By: Placido Sou M.D.   On: 12/29/2022 01:44      Assessment/Plan SBO Chronic constipation - no prior h/o abdominal surgery - CT scan on admission reports transition point in the jejunum concerning for a low-grade or functional small-bowel obstruction; the distal small bowel is decompressed. CT also shows a large amount of stool in the colon, as well as a distended and fluid-filled stomach  - NG tube is in place. Patient is starting to pass some flatus. No indication for acute surgical intervention. Will start patient on SBO protocol. Dulcolax suppository; ultimately patient will need to continue a good bowel regimen at home. We will follow.  ID - none VTE - SCDs, lovenox FEN - IVF, NPO/NGT to LIWS Foley - none   I reviewed hospitalist notes, last 24 h vitals and pain scores, last 48 h intake and output, last 24 h labs and trends, and last 24 h imaging results   Wellington [place], [facility] 12/30/2022, 10:27 AM Please see Amion for pager number during  day hours 7:00am-4:30pm]

## 2022-12-30 NOTE — Progress Notes (Signed)
Gastografin given at [place]

## 2022-12-31 ENCOUNTER — Inpatient Hospital Stay (HOSPITAL_COMMUNITY): Payer: Medicaid Other

## 2022-12-31 DIAGNOSIS — K5669 Other partial intestinal obstruction: Secondary | ICD-10-CM | POA: Diagnosis not present

## 2022-12-31 DIAGNOSIS — K6289 Other specified diseases of anus and rectum: Secondary | ICD-10-CM | POA: Diagnosis not present

## 2022-12-31 DIAGNOSIS — K56609 Unspecified intestinal obstruction, unspecified as to partial versus complete obstruction: Secondary | ICD-10-CM | POA: Diagnosis not present

## 2022-12-31 LAB — BASIC METABOLIC PANEL
Anion gap: 13 (ref 5–15)
BUN: 16 mg/dL (ref 6–20)
CO2: 22 mmol/L (ref 22–32)
Calcium: 7.7 mg/dL — ABNORMAL LOW (ref 8.9–10.3)
Chloride: 105 mmol/L (ref 98–111)
Creatinine, Ser: 0.81 mg/dL (ref 0.61–1.24)
GFR, Estimated: >60 mL/min (ref >60)
Glucose, Bld: 73 mg/dL (ref 70–99)
Potassium: 3.4 mmol/L — ABNORMAL LOW (ref 3.5–5.1)
Sodium: 140 mmol/L (ref 135–145)

## 2022-12-31 LAB — CBC
HCT: 44 % (ref 39.0–52.0)
Hemoglobin: 13.9 g/dL (ref 13.0–17.0)
MCH: 26.8 pg (ref 26.0–34.0)
MCHC: 31.6 g/dL (ref 30.0–36.0)
MCV: 84.9 fL (ref 80.0–100.0)
Platelets: 122 10*3/uL — ABNORMAL LOW (ref 150–400)
RBC: 5.18 MIL/uL (ref 4.22–5.81)
RDW: 13.5 % (ref 11.5–15.5)
WBC: 11.5 10*3/uL — ABNORMAL HIGH (ref 4.0–10.5)
nRBC: 0 % (ref 0.0–0.2)

## 2022-12-31 LAB — LACTIC ACID, PLASMA: Lactic Acid, Venous: 1.3 mmol/L (ref 0.5–1.9)

## 2022-12-31 MED ORDER — VANCOMYCIN HCL 1750 MG/350ML IV SOLN
1750.0000 mg | Freq: Two times a day (BID) | INTRAVENOUS | Status: DC
  Administered 2022-12-31 – 2023-01-02 (×4): 1750 mg via INTRAVENOUS
  Filled 2022-12-31 (×5): qty 350

## 2022-12-31 MED ORDER — POTASSIUM CHLORIDE 10 MEQ/100ML IV SOLN
10.0000 meq | INTRAVENOUS | Status: AC
  Administered 2022-12-31 (×4): 10 meq via INTRAVENOUS
  Filled 2022-12-31 (×2): qty 100

## 2022-12-31 MED ORDER — PIPERACILLIN-TAZOBACTAM 3.375 G IVPB
3.3750 g | Freq: Three times a day (TID) | INTRAVENOUS | Status: DC
  Administered 2022-12-31 – 2023-01-03 (×8): 3.375 g via INTRAVENOUS
  Filled 2022-12-31 (×7): qty 50

## 2022-12-31 MED ORDER — SODIUM CHLORIDE 0.9 % IV SOLN: INTRAVENOUS | Status: DC | PRN

## 2022-12-31 MED ORDER — CHLORPROMAZINE HCL 25 MG/ML IJ SOLN
25.0000 mg | Freq: Three times a day (TID) | INTRAMUSCULAR | Status: DC | PRN
  Administered 2022-12-31 – 2023-01-08 (×14): 25 mg via INTRAMUSCULAR
  Filled 2022-12-31 (×18): qty 1

## 2022-12-31 MED ORDER — ACETAMINOPHEN 120 MG RE SUPP
120.0000 mg | RECTAL | Status: DC | PRN
  Administered 2022-12-31 – 2023-01-01 (×2): 120 mg via RECTAL
  Filled 2022-12-31 (×4): qty 1

## 2022-12-31 MED ORDER — IOHEXOL 9 MG/ML PO SOLN
500.0000 mL | ORAL | Status: AC
  Administered 2022-12-31 (×2): 500 mL via ORAL

## 2022-12-31 MED ORDER — IOHEXOL 300 MG/ML  SOLN
100.0000 mL | Freq: Once | INTRAMUSCULAR | Status: AC | PRN
  Administered 2022-12-31: 100 mL via INTRAVENOUS

## 2022-12-31 NOTE — Progress Notes (Addendum)
PROGRESS NOTE  Carlos [name]  [id number] DOB: Sep 26, 1966 DOA: 12/28/2022 PCP: Nicholes Rough, PA-C   Brief Narrative: Patient is a 57 year old male with history of spinal ependymoma status post surgery, psoriasis,bed bound  who presented with 5-day history of abdominal pain, nausea, no bowel movement no history of abdominal surgery in the past.  History of chronic constipation.  On presentation lab work showed sodium 132, potassium 3.4, WBC of 17.3.  CT scan showed functional low-grade SBO with large amount of stool in the colon.  Started on conservative management after admitted for the management of low-grade SBO.  NG tube placed.  General surgery consulted and following  Assessment & Plan:   SBO: CT scan showed functional low-grade SBO with large amount of stool in the colon.Showed  distended and fluid-filled stomach with fluid extending into the distal thoracic esophagus.  Mildly dilated duodenum and proximal jejunum with gradual transition point in the mid abdomen. Started on conservative management after admitted for the management of low-grade SBO.  NG tube placed. Continue IV fluids, antiemetics. Patient denies any abdomen pain, nausea or vomiting.  NG tube draining dark fluid.  He is passing gas.  He has good bowel sounds.  Patient had some liquidy stool today. Follow-up abdominal x-ray today showed persistent features of SBO.  General surgery following   Leukocytosis: Unclear etiology, resolved, most likely reactive.   Hypokalemia: Continue monitoring and supplementation   History of spinal cord ependymoma: S/p laminectomy.  Has chronic contractures and decreased ambulation/mostly bed bound.  PT consulted here,recommended HH   History of psoriasis: we recommend follow-up with dermatology as an outpatient  Addendum:  Patient noted to be febrile this afternoon along with sinus tachycardia.  Unclear etiology.  One of the blood culture sets sent on 2/25 showed multiple organisms,  most likely contamination.  However, will start on broad spectrum antibiotics, do CT abdomen/pelvis stat, get repeat blood cultures, lactic acid level and CBC     DVT prophylaxis:enoxaparin (LOVENOX) injection 40 mg Start: 12/29/22 1800     Code Status: Full Code  Family Communication: Discussed with mother at bedside on 2/25  Patient status: Inpatient  Patient is from : Home  Anticipated discharge to: Home  Estimated DC date: After resolution of bowel obstruction   Consultants: General surgery  Procedures: None  Antimicrobials:  Anti-infectives (From admission, onward)    None       Subjective: Patient seen and examined at bedside today.  He said he had some liquidy bowel movement today.  Denies any abdominal pain.  NG tube draining dark fluid.  Passing gas.  Objective: Vitals:   12/30/22 2306 12/30/22 2310 12/31/22 0034 12/31/22 0522  BP: 118/76   126/65  Pulse: (!) 108   (!) 107  Resp: 16   17  Temp:  (!) 100.4 F (38 C) 99.1 F (37.3 C) 98.6 F (37 C)  TempSrc:  Oral Oral Oral  SpO2: 94%   93%  Weight:      Height:        Intake/Output Summary (Last 24 hours) at 12/31/2022 1308 Last data filed at 12/31/2022 1144 Gross per 24 hour  Intake 1982.42 ml  Output 1151 ml  Net 831.42 ml   Filed Weights   12/28/22 2326  Weight: 90.7 kg    Examination:  General exam: Overall comfortable, not in distress HEENT: PERRL,NG tube Respiratory system:  no wheezes or crackles  Cardiovascular system: S1 & S2 heard, RRR.  Gastrointestinal system: Abdomen is soft, mildly distended,Bs  present Central nervous system: Alert and oriented Extremities: contractures of extremities Skin: rash on the face    Data Reviewed: I have personally reviewed following labs and imaging studies  CBC: Recent Labs  Lab 12/29/22 0147 12/29/22 0744 12/30/22 0538  WBC 17.3* 15.0* 8.9  NEUTROABS 14.8*  --   --   HGB 14.7 13.7 14.2  HCT 45.3 42.5 44.9  MCV 82.4 82.5 83.9  PLT  213 205 [id number]   Basic Metabolic Panel: Recent Labs  Lab 12/29/22 0147 12/29/22 0744 12/30/22 0334 12/31/22 0343  NA 132* 132* 133* 140  K 3.4* 3.8 4.0 3.4*  CL 99 100 97* 105  CO2 20* 20* 24 22  GLUCOSE 138* 115* 81 73  BUN 31* 24* 23* 16  CREATININE 0.65 0.67  0.59* 0.83 0.81  CALCIUM 8.9 8.7* 8.6* 7.7*     Recent Results (from the past 240 hour(s))  Blood culture (routine x 2)     Status: Abnormal (Preliminary result)   Collection Time: 12/29/22  3:22 AM   Specimen: BLOOD RIGHT HAND  Result Value Ref Range Status   Specimen Description   Final    BLOOD RIGHT HAND BOTTLES DRAWN AEROBIC AND ANAEROBIC Performed at [facility], [place] [address] [place], [place] [address]    Special Requests   Final    Blood Culture adequate volume Performed at [place] [address], [place], [place] [address]    Culture  Setup Time   Final    GRAM POSITIVE COCCI IN CLUSTERS IN BOTH AEROBIC AND ANAEROBIC BOTTLES CRITICAL RESULT CALLED TO, READ BACK BY AND VERIFIED WITH: [place] 12/30/22 @ 0009 BY AB    Culture (A)  Final    STAPHYLOCOCCUS EPIDERMIDIS THE SIGNIFICANCE OF ISOLATING THIS ORGANISM FROM A SINGLE SET OF BLOOD CULTURES WHEN MULTIPLE SETS ARE DRAWN IS UNCERTAIN. PLEASE NOTIFY THE MICROBIOLOGY DEPARTMENT WITHIN ONE WEEK IF SPECIATION AND SENSITIVITIES ARE REQUIRED. Performed at [facility], [place] [address], [place],  [address]    Report Status PENDING  Incomplete  Blood Culture ID Panel (Reflexed)     Status: Abnormal   Collection Time: 12/29/22  3:22 AM  Result Value Ref Range Status   Enterococcus faecalis NOT DETECTED NOT DETECTED Final   Enterococcus Faecium NOT DETECTED NOT DETECTED Final   Listeria monocytogenes NOT DETECTED NOT DETECTED Final   Staphylococcus species DETECTED (A) NOT DETECTED Final    Comment: CRITICAL RESULT CALLED TO, READ BACK BY AND VERIFIED WITH: PHARMD M. LILLISTON 12/30/22 @ 0009  BY AB    Staphylococcus aureus (BCID) NOT DETECTED NOT DETECTED Final   Staphylococcus epidermidis DETECTED (A) NOT DETECTED Final    Comment: CRITICAL RESULT CALLED TO, READ BACK BY AND VERIFIED WITH: PHARMD M. LILLISTON 12/30/22 @ 0009 BY AB    Staphylococcus lugdunensis NOT DETECTED NOT DETECTED Final   Streptococcus species NOT DETECTED NOT DETECTED Final   Streptococcus agalactiae NOT DETECTED NOT DETECTED Final   Streptococcus pneumoniae NOT DETECTED NOT DETECTED Final   Streptococcus pyogenes NOT DETECTED NOT DETECTED Final   A.calcoaceticus-baumannii NOT DETECTED NOT DETECTED Final   Bacteroides fragilis NOT DETECTED NOT DETECTED Final   Enterobacterales NOT DETECTED NOT DETECTED Final   Enterobacter cloacae complex NOT DETECTED NOT DETECTED Final   Escherichia coli NOT DETECTED NOT DETECTED Final   Klebsiella aerogenes NOT DETECTED NOT DETECTED Final   Klebsiella oxytoca NOT DETECTED NOT DETECTED Final   Klebsiella pneumoniae NOT DETECTED NOT DETECTED  Final   Proteus species NOT DETECTED NOT DETECTED Final   Salmonella species NOT DETECTED NOT DETECTED Final   Serratia marcescens NOT DETECTED NOT DETECTED Final   Haemophilus influenzae NOT DETECTED NOT DETECTED Final   Neisseria meningitidis NOT DETECTED NOT DETECTED Final   Pseudomonas aeruginosa NOT DETECTED NOT DETECTED Final   Stenotrophomonas maltophilia NOT DETECTED NOT DETECTED Final   Candida albicans NOT DETECTED NOT DETECTED Final   Candida auris NOT DETECTED NOT DETECTED Final   Candida glabrata NOT DETECTED NOT DETECTED Final   Candida krusei NOT DETECTED NOT DETECTED Final   Candida parapsilosis NOT DETECTED NOT DETECTED Final   Candida tropicalis NOT DETECTED NOT DETECTED Final   Cryptococcus neoformans/gattii NOT DETECTED NOT DETECTED Final   Methicillin resistance mecA/C NOT DETECTED NOT DETECTED Final    Comment: Performed at [facility], [address], [place], [place] [address]  Blood culture  (routine x 2)     Status: None (Preliminary result)   Collection Time: 12/29/22  3:33 AM   Specimen: Right Antecubital; Blood  Result Value Ref Range Status   Specimen Description RIGHT ANTECUBITAL BLOOD  Final   Special Requests   Final    Blood Culture results may not be optimal due to an inadequate volume of blood received in culture bottles BOTTLES DRAWN AEROBIC AND ANAEROBIC   Culture   Final    NO GROWTH 2 DAYS Performed at [place] [address], [place], [place] [address]    Report Status PENDING  Incomplete     Radiology Studies: DG Abd Portable 1V  Result Date: 12/31/2022 CLINICAL DATA:  Small bowel obstruction EXAM: PORTABLE ABDOMEN - 1 VIEW COMPARISON:  Yesterday FINDINGS: Enteric tube with tip at the stomach where there is enteric contrast. Diffuse gaseous distension of colon and somewhat affecting small bowel as well. No concerning mass effect or gas collection. IMPRESSION: Unchanged gas dilated bowel. Electronically Signed   By: Jorje Guild M.D.   On: 12/31/2022 07:52   DG Abd Portable 1V-Small Bowel Obstruction Protocol-initial, 8 hr delay  Result Date: 12/30/2022 CLINICAL DATA:  SBO protocol.  8 hour delay image EXAM: PORTABLE ABDOMEN - 1 VIEW COMPARISON:  Radiograph 12/30/2022 at 7:14 a.m. FINDINGS: Persistent small bowel dilation similar to radiographs earlier today. Normal caliber colon with moderate stool burden. Enteric tube tip in the stomach with side port at the GE junction. Enteric contrast within the gastric fundus. No enteric contrast is seen beyond the stomach. IMPRESSION: Persistent small bowel obstruction. Enteric contrast has not progressed beyond the stomach. Electronically Signed   By: Placido Sou M.D.   On: 12/30/2022 20:45   DG Abd 1 View  Result Date: 12/30/2022 CLINICAL DATA:  [id number] Small bowel obstruction ([place]) [id number] EXAM: ABDOMEN - 1 VIEW COMPARISON:  CT 12/29/2022 FINDINGS: There is persistent small bowel dilation with normal  caliber colon. Nasogastric tube side port overlies the region of the GE junction, tip overlies the stomach. The stomach is distended. Moderate stool burden in the colon. IMPRESSION: Persistent small bowel obstruction. Nasogastric tube side port overlies the region of the GE junction, recommend advancement by 6.0 cm. Electronically Signed   By: Maurine Simmering M.D.   On: 12/30/2022 07:59    Scheduled Meds:  enoxaparin (LOVENOX) injection  40 mg Subcutaneous Q24H   sodium chloride flush  3 mL Intravenous Q12H   Continuous Infusions:  sodium chloride 100 mL/hr at 12/31/22 1143   sodium chloride  LOS: 2 days   Shelly Coss, MD [facility] P2/27/2024, 1:08 PM

## 2022-12-31 NOTE — Progress Notes (Signed)
PT Cancellation Note  Patient Details Name: Thelonious Oglesbee MRN: [id number] DOB: Apr 16, 1966   Cancelled Treatment:    Reason Eval/Treat Not Completed: Fatigue/lethargy limiting ability to participate (pt sleeping soundly, pt's mother requested PT attempt another time as pt has had a rough day and is just now able to rest. Will follow.)   Philomena Doheny PT 12/31/2022  Acute Rehabilitation Services  Office [phone]

## 2022-12-31 NOTE — Progress Notes (Signed)
Pharmacy Antibiotic Note  Carlos [name] is a 57 y.o. male admitted on 12/28/2022 with SBO. He spiked fever  this afternoon.  Blood cx growing staph (uncertain if true pathogen vs contaminant). Unknown source of fever. Pharmacy has been consulted for Vancomycin + Zosyn dosing. Repeat blood cx ordered.   Plan: Zosyn 3.375g IV q8h (4 hour infusion). Vancomycin '1750mg'$  IV q12h to target AUC 400-550.   Estimated AUC= 484. Monitor renal function and cx data   Height: '6\' 4"'$  (193 cm) Weight: 90.7 kg (200 lb) IBW/kg (Calculated) : 86.8  Temp (24hrs), Avg:99.9 F (37.7 C), Min:98.6 F (37 C), Max:101.6 F (38.7 C)  Recent Labs  Lab 12/29/22 0147 12/29/22 0322 12/29/22 0744 12/30/22 0334 12/30/22 0538 12/31/22 0343  WBC 17.3*  --  15.0*  --  8.9  --   CREATININE 0.65  --  0.67  0.59* 0.83  --  0.81  LATICACIDVEN 1.4 1.2  --   --   --   --     Estimated Creatinine Clearance: 128 mL/min (by C-G formula based on SCr of 0.81 mg/dL).    Allergies  Allergen Reactions   Bactrim [Sulfamethoxazole-Trimethoprim] Hives and Rash    Antimicrobials this admission: 2/27 Zosyn >>  2/27 Vancomycin >>   Dose adjustments this admission:  Microbiology results: 2/27 BCx: ordered 2/25 BCx: 2/4 bottles (1 set) +Staph cohnii, Staph epidermidis  Thank you for allowing pharmacy to be a part of this patient's care.  Netta Cedars PharmD 12/31/2022 2:43 PM

## 2022-12-31 NOTE — Progress Notes (Addendum)
MEWS Progress Note  Patient Details Name: Carlos [name] MRN: [id number] DOB: 1966/07/12 Today's Date: 12/31/2022   MEWS Flowsheet Documentation:  Assess: MEWS Score Temp: (!) 100.9 F (38.3 C) BP: 129/76 MAP (mmHg): 92 Pulse Rate: (!) 128 ECG Heart Rate: (!) 104 Resp: 18 Level of Consciousness: Alert SpO2: 92 % O2 Device: Room Air Assess: MEWS Score MEWS Temp: 1 MEWS Systolic: 0 MEWS Pulse: 2 MEWS RR: 0 MEWS LOC: 0 MEWS Score: 3 MEWS Score Color: Yellow Assess: SIRS CRITERIA SIRS Temperature : 0 SIRS Respirations : 0 SIRS Pulse: 1 SIRS WBC: 0 SIRS Score Sum : 1 SIRS Temperature : 0 SIRS Pulse: 1 SIRS Respirations : 0 SIRS WBC: 0 SIRS Score Sum : 1 Assess: if the MEWS score is Yellow or Red Were vital signs taken at a resting state?: Yes Focused Assessment: No change from prior assessment Does the patient meet 2 or more of the SIRS criteria?: Yes Does the patient have a confirmed or suspected source of infection?: No MEWS guidelines implemented : Yes, red Treat MEWS Interventions: Considered administering scheduled or prn medications/treatments as ordered Take Vital Signs Increase Vital Sign Frequency : Red: Q1hr x2, continue Q4hrs until patient remains green for 12hrs Escalate MEWS: Escalate: Red: Discuss with charge nurse and notify provider. Consider notifying RRT. If remains red for 2 hours consider need for higher level of care Notify: Charge Nurse/RN Name of Charge Nurse/RN Notified: [name] F Provider Notification Provider Name/Title: Adhikari Date Provider Notified: 12/31/22 Time Provider Notified: [id number] Method of Notification:  (secure chat) Notification Reason: Change in status Provider response: See new orders Date of Provider Response: 12/31/22 Time of Provider Response: [place], [place] 12/31/2022, 6:52 PM

## 2022-12-31 NOTE — Progress Notes (Signed)
Patient ID: Carlos [name], male   DOB: 02/01/1966, 57 y.o.   MRN: [id number] [facility] Surgery Progress Note     Subjective: CC-  Main complaint today is hiccups. Denies worsening abdominal pain. States that he passed gas yesterday and some today. He had BM x2 yesterday after suppository, and he had another BM this morning without suppository. NG with 1.1L out last 24 hours.  Objective: Vital signs in last 24 hours: Temp:  [98.6 F (37 C)-100.4 F (38 C)] 98.6 F (37 C) (02/27 0522) Pulse Rate:  [104-108] 107 (02/27 0522) Resp:  [16-17] 17 (02/27 0522) BP: (118-127)/(65-76) 126/65 (02/27 0522) SpO2:  [93 %-100 %] 93 % (02/27 0522) Last BM Date : 12/30/22  Intake/Output from previous day: 02/26 0701 - 02/27 0700 In: 2265.3 [I.V.:2175.3; NG/GT:90] Out: 1400 [Urine:300; Emesis/NG output:1100] Intake/Output this shift: No intake/output data recorded.  PE: Gen: Alert, NAD Abd: soft, mild distension, minimal diffuse tenderness without rebound or guarding, +BS, no masses, hernias, or organomegaly   Lab Results:  Recent Labs    12/29/22 0744 12/30/22 0538  WBC 15.0* 8.9  HGB 13.7 14.2  HCT 42.5 44.9  PLT 205 183   BMET Recent Labs    12/30/22 0334 12/31/22 0343  NA 133* 140  K 4.0 3.4*  CL 97* 105  CO2 24 22  GLUCOSE 81 73  BUN 23* 16  CREATININE 0.83 0.81  CALCIUM 8.6* 7.7*   PT/INR No results for input(s): "LABPROT", "INR" in the last 72 hours. CMP     Component Value Date/Time   NA 140 12/31/2022 0343   K 3.4 (L) 12/31/2022 0343   CL 105 12/31/2022 0343   CO2 22 12/31/2022 0343   GLUCOSE 73 12/31/2022 0343   BUN 16 12/31/2022 0343   CREATININE 0.81 12/31/2022 0343   CALCIUM 7.7 (L) 12/31/2022 0343   PROT 7.2 12/29/2022 0147   ALBUMIN 3.9 12/29/2022 0147   AST 16 12/29/2022 0147   ALT 14 12/29/2022 0147   ALKPHOS 50 12/29/2022 0147   BILITOT 0.7 12/29/2022 0147   GFRNONAA >60 12/31/2022 0343   GFRAA >60 09/26/2019 0916   Lipase      Component Value Date/Time   LIPASE 31 12/29/2022 0147       Studies/Results: DG Abd Portable 1V  Result Date: 12/31/2022 CLINICAL DATA:  Small bowel obstruction EXAM: PORTABLE ABDOMEN - 1 VIEW COMPARISON:  Yesterday FINDINGS: Enteric tube with tip at the stomach where there is enteric contrast. Diffuse gaseous distension of colon and somewhat affecting small bowel as well. No concerning mass effect or gas collection. IMPRESSION: Unchanged gas dilated bowel. Electronically Signed   By: Jorje Guild M.D.   On: 12/31/2022 07:52   DG Abd Portable 1V-Small Bowel Obstruction Protocol-initial, 8 hr delay  Result Date: 12/30/2022 CLINICAL DATA:  SBO protocol.  8 hour delay image EXAM: PORTABLE ABDOMEN - 1 VIEW COMPARISON:  Radiograph 12/30/2022 at 7:14 a.m. FINDINGS: Persistent small bowel dilation similar to radiographs earlier today. Normal caliber colon with moderate stool burden. Enteric tube tip in the stomach with side port at the GE junction. Enteric contrast within the gastric fundus. No enteric contrast is seen beyond the stomach. IMPRESSION: Persistent small bowel obstruction. Enteric contrast has not progressed beyond the stomach. Electronically Signed   By: Placido Sou M.D.   On: 12/30/2022 20:45   DG Abd 1 View  Result Date: 12/30/2022 CLINICAL DATA:  [id number] Small bowel obstruction ([place]) [id number] EXAM: ABDOMEN - 1 VIEW COMPARISON:  CT  12/29/2022 FINDINGS: There is persistent small bowel dilation with normal caliber colon. Nasogastric tube side port overlies the region of the GE junction, tip overlies the stomach. The stomach is distended. Moderate stool burden in the colon. IMPRESSION: Persistent small bowel obstruction. Nasogastric tube side port overlies the region of the GE junction, recommend advancement by 6.0 cm. Electronically Signed   By: Maurine Simmering M.D.   On: 12/30/2022 07:59    Anti-infectives: Anti-infectives (From admission, onward)    None         Assessment/Plan SBO - no prior h/o abdominal surgery - CT scan on admission reports transition point in the jejunum concerning for a low-grade or functional small-bowel obstruction; the distal small bowel is decompressed. CT also shows a large amount of stool in the colon, as well as a distended and fluid-filled stomach  - Xray shows ongoing SBO and he continues to have high NG output. He is however having some bowel function and abdominal exam is not worse. Continue bowel rest/ NG to LIWS today. Repeat film in AM. May need surgery tomorrow if not improving.   ID - none VTE - SCDs, lovenox FEN - IVF, NPO/NGT to LIWS Foley - none  History of spinal cord ependymoma  Bedbound Chronic constipation Hx psoriasis  I reviewed hospitalist notes, last 24 h vitals and pain scores, last 48 h intake and output, last 24 h labs and trends, and last 24 h imaging results.    LOS: 2 days    [facility] 12/31/2022, 10:00 AM Please see Amion for pager number during day hours 7:00am-4:30pm

## 2022-12-31 NOTE — Plan of Care (Signed)

## 2023-01-01 ENCOUNTER — Inpatient Hospital Stay (HOSPITAL_COMMUNITY): Payer: Medicaid Other

## 2023-01-01 DIAGNOSIS — C720 Malignant neoplasm of spinal cord: Secondary | ICD-10-CM

## 2023-01-01 DIAGNOSIS — K56600 Partial intestinal obstruction, unspecified as to cause: Secondary | ICD-10-CM | POA: Diagnosis not present

## 2023-01-01 DIAGNOSIS — K56609 Unspecified intestinal obstruction, unspecified as to partial versus complete obstruction: Secondary | ICD-10-CM | POA: Diagnosis not present

## 2023-01-01 DIAGNOSIS — K5669 Other partial intestinal obstruction: Secondary | ICD-10-CM | POA: Diagnosis not present

## 2023-01-01 LAB — CULTURE, BLOOD (ROUTINE X 2): Special Requests: ADEQUATE

## 2023-01-01 LAB — CBC
HCT: 39.9 % (ref 39.0–52.0)
Hemoglobin: 12.3 g/dL — ABNORMAL LOW (ref 13.0–17.0)
MCH: 26.6 pg (ref 26.0–34.0)
MCHC: 30.8 g/dL (ref 30.0–36.0)
MCV: 86.2 fL (ref 80.0–100.0)
Platelets: 173 10*3/uL (ref 150–400)
RBC: 4.63 MIL/uL (ref 4.22–5.81)
RDW: 13.7 % (ref 11.5–15.5)
WBC: 11.2 10*3/uL — ABNORMAL HIGH (ref 4.0–10.5)
nRBC: 0 % (ref 0.0–0.2)

## 2023-01-01 LAB — BASIC METABOLIC PANEL
Anion gap: 12 (ref 5–15)
BUN: 11 mg/dL (ref 6–20)
CO2: 17 mmol/L — ABNORMAL LOW (ref 22–32)
Calcium: 7.8 mg/dL — ABNORMAL LOW (ref 8.9–10.3)
Chloride: 107 mmol/L (ref 98–111)
Creatinine, Ser: 0.72 mg/dL (ref 0.61–1.24)
GFR, Estimated: >60 mL/min (ref >60)
Glucose, Bld: 100 mg/dL — ABNORMAL HIGH (ref 70–99)
Potassium: 3.8 mmol/L (ref 3.5–5.1)
Sodium: 136 mmol/L (ref 135–145)

## 2023-01-01 LAB — MAGNESIUM: Magnesium: 2 mg/dL (ref 1.7–2.4)

## 2023-01-01 MED ORDER — BISACODYL 10 MG RE SUPP
10.0000 mg | Freq: Every day | RECTAL | Status: DC
  Administered 2023-01-01 – 2023-01-09 (×9): 10 mg via RECTAL
  Filled 2023-01-01 (×10): qty 1

## 2023-01-01 MED ORDER — BISACODYL 10 MG RE SUPP: 10.0000 mg | Freq: Every day | RECTAL | Status: DC | PRN

## 2023-01-01 MED ORDER — PHENOL 1.4 % MT LIQD
1.0000 | OROMUCOSAL | Status: DC | PRN
  Administered 2023-01-02 – 2023-01-07 (×4): 1 via OROMUCOSAL
  Filled 2023-01-01: qty 177

## 2023-01-01 NOTE — Consult Note (Signed)
[facility] Gastroenterology Consult  Referring Provider: Surgical team/Elizabeth Refton, [place] Primary Care Physician:  Nicholes Rough, PA-C Primary Gastroenterologist: Althia Forts  Reason for Consultation: Motility disorder  HPI: Carlos [name] is a 57 y.o. male with past medical history of spinal ependymoma status post surgery, bedbound, admitted on 12/28/2022 with abdominal pain, nausea and vomiting.  Most of the history is obtained from the patient as well as his mother present at bedside. Patient struggles with constipation since childhood. He recalls requiring multiple fecal disimpactions and states he may have had a colonoscopy in 1990s for the same. He normally has a bowel movement every 5 days, he takes Ex-Lax dark chocolate on a regular basis to help move his bowels. He lives at home and has assistance with feeding, mostly uses a diaper, can only ambulate in a wheelchair, he can move his lower extremities but cannot stand by himself and has contraction deformity of left upper extremity. Patient states that prior to admission he had severe acid reflux and heartburn which is unusual for him, denies requiring PPI or H2 blockers at home.  As per his mother he eats pretty well and does not struggle with difficulty swallowing or pain on swallowing.  He has not had any blood in his stool or black stools.  No prior endoscopy.  On admission, x-ray from 12/29/2022 showed marked gaseous dilation of stomach and small bowel. CT abdomen and pelvis with contrast from 12/29/2022: Distended and fluid-filled stomach with fluid extending into the distal thoracic esophagus, mildly dilated duodenum and proximal jejunum with gradual transition point in mid abdomen Abdominal x-ray 12/30/2022: Persistent small bowel obstruction Small bowel obstruction protocol abdominal x-ray 12/30/2022: Persistent small bowel obstruction Abdominal x-ray 12/31/2022: Unchanged gas dilated bowel CT abdomen and pelvis with contrast 12/31/2022: Mild  fecal retention in the rectum, stercoral colitis, redundant sigmoid with new narrowing of sigmoid over midline lower abdomen, nonobstructive, no volvulus Abdominal x-ray 01/01/2023: No abnormal bowel dilation   Past Medical History:  Diagnosis Date   Crush injury to hand 12/2018   bilateral hands with multipe fractures   Finger osteomyelitis, 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 Authorizing Provider  acetaminophen (TYLENOL) 325 MG tablet Take 2 tablets (650 mg total) by mouth every 6 (six) hours as needed for mild pain (or Fever >/= 101). 09/27/19  Yes Nita Sells, MD  polyethylene glycol (MIRALAX / GLYCOLAX) 17 g packet Take 17 g by mouth daily as needed for mild constipation.   Yes [provider]  senna (SENOKOT) 8.6 MG tablet Take 1 tablet by mouth daily.   Yes [provider]  baclofen (LIORESAL) 10 MG tablet Take 10 mg by mouth 3 (three) times daily. Patient not taking: Reported on 12/29/2022 07/05/21   Myrle Sheng, MD    Current Facility-Administered Medications  Medication Dose Route Frequency Provider Last Rate Last Admin   0.9 %  sodium chloride infusion   Intravenous Continuous Shelly Coss, MD 100 mL/hr at 01/01/23 1007 New Bag at 01/01/23 1007   0.9 %  sodium chloride infusion   Intravenous PRN Adhikari, Tamsen Meek,  MD       acetaminophen (TYLENOL) suppository 120 mg  120 mg Rectal Q4H PRN Shelly Coss, MD   120 mg at 12/31/22 1604   bisacodyl (DULCOLAX) suppository 10 mg  10 mg Rectal Daily PRN Jill Alexanders, PA-C       chlorproMAZINE  (THORAZINE) injection 25 mg  25 mg Intramuscular TID PRN Meuth, Brooke A, PA-C   25 mg at 12/31/22 2013   enoxaparin (LOVENOX) injection 40 mg  40 mg Subcutaneous Q24H Gilles Chiquito B, MD   40 mg at 12/31/22 1810   HYDROmorphone (DILAUDID) injection 0.5-1 mg  0.5-1 mg Intravenous Q2H PRN Gilles Chiquito B, MD   1 mg at 01/01/23 0924   ondansetron (ZOFRAN) tablet 4 mg  4 mg Per Tube Q6H PRN Shelly Coss, MD       Or   ondansetron (ZOFRAN) injection 4 mg  4 mg Intravenous Q6H PRN Shelly Coss, MD   4 mg at 12/29/22 2139   phenol (CHLORASEPTIC) mouth spray 1 spray  1 spray Mouth/Throat PRN [name], MD       piperacillin-tazobactam (ZOSYN) IVPB 3.375 g  3.375 g Intravenous Q8H Thomes Lolling, RPH 12.5 mL/hr at 01/01/23 1039 3.375 g at 01/01/23 1039   sodium chloride flush (NS) 0.9 % injection 3 mL  3 mL Intravenous Q12H Gilles Chiquito B, MD   3 mL at 01/01/23 [id number]   vancomycin (VANCOREADY) IVPB 1750 mg/350 mL  1,750 mg Intravenous Q12H Thomes Lolling, RPH 175 mL/hr at 01/01/23 0456 1,750 mg at 01/01/23 0456    Allergies as of 12/28/2022 - Review Complete 12/28/2022  Allergen Reaction Noted   Bactrim [sulfamethoxazole-trimethoprim] Hives and Rash 09/24/2019    Family History  Problem Relation Age of Onset   Breast cancer Mother        DCIS times 2    Social History   Socioeconomic History   Marital status: Married    Spouse name: Not on file   Number of children: Not on file   Years of education: Not on file   Highest education level: Not on file  Occupational History   Occupation: laborer  Tobacco Use   Smoking status: Never   Smokeless tobacco: Never  Vaping Use   Vaping Use: Never used  Substance and Sexual Activity   Alcohol use: Yes    Alcohol/week: 2.0 standard drinks of alcohol    Types: 2 Cans of beer per week   Drug use: No   Sexual activity: Yes  Other Topics Concern   Not on file  Social History Narrative   Lives with mother   Social  Determinants of Health   Financial Resource Strain: Not on file  Food Insecurity: Not on file  Transportation Needs: Not on file  Physical Activity: Not on file  Stress: Not on file  Social Connections: Not on file  Intimate Partner Violence: Not on file    Review of Systems: As per HPI  Physical Exam: Vital signs in last 24 hours: Temp:  [98.1 F (36.7 C)-101.6 F (38.7 C)] 100.5 F (38.1 C) (02/28 0815) Pulse Rate:  [106-128] 115 (02/28 0815) Resp:  [14-20] 18 (02/28 0815) BP: (113-142)/(72-97) 120/79 (02/28 0815) SpO2:  [91 %-97 %] 93 % (02/28 0811) Last BM Date : 01/01/23  General:   Alert, well-nourished, pleasant and cooperative in NAD Head: NG tube connected to wall suction with thick brown fluid return Eyes:  Sclera clear, no icterus.   Conjunctiva pink. Ears:  Normal auditory acuity. Nose:  No deformity, discharge,  or lesions. Mouth:  No deformity or lesions.  Oropharynx pink & moist. Neck:  Supple; no masses or thyromegaly. Lungs:  Clear throughout to auscultation.   No wheezes, crackles, or rhonchi. No acute distress. Heart:  Regular rate and rhythm; no murmurs, clicks, rubs,  or gallops. Extremities:  Without clubbing or edema. Neurologic: Contraction deformity of left upper extremity, able to move his bilateral lower extremities Skin:  Intact without significant lesions or rashes. Psych:  Alert and cooperative. Normal mood and affect. Abdomen: Slightly distended abdomen, hypoactive bowel sounds, nontender        Lab Results: Recent Labs    12/30/22 0538 12/31/22 1504 01/01/23 0349  WBC 8.9 11.5* 11.2*  HGB 14.2 13.9 12.3*  HCT 44.9 44.0 39.9  PLT 183 122* 173   BMET Recent Labs    12/30/22 0334 12/31/22 0343 01/01/23 0349  NA 133* 140 136  K 4.0 3.4* 3.8  CL 97* 105 107  CO2 24 22 17*  GLUCOSE 81 73 100*  BUN 23* 16 11  CREATININE 0.83 0.81 0.72  CALCIUM 8.6* 7.7* 7.8*   LFT No results for input(s): "PROT", "ALBUMIN", "AST", "ALT",  "ALKPHOS", "BILITOT", "BILIDIR", "IBILI" in the last 72 hours. PT/INR No results for input(s): "LABPROT", "INR" in the last 72 hours.  Studies/Results: DG Abd Portable 1V  Result Date: 01/01/2023 CLINICAL DATA:  Small bowel obstruction. EXAM: PORTABLE ABDOMEN - 1 VIEW COMPARISON:  December 31, 2022. FINDINGS: Nasogastric tube tip is seen in proximal stomach. No abnormal bowel dilatation is noted. Moderate amount of stool is seen throughout the colon. IMPRESSION: No abnormal bowel dilatation. Electronically Signed   By: Marijo Conception M.D.   On: 01/01/2023 08:18   CT ABDOMEN PELVIS W CONTRAST  Result Date: 12/31/2022 CLINICAL DATA:  Abdominal pain and possible small bowel obstruction on previous CT. EXAM: CT ABDOMEN AND PELVIS WITH CONTRAST TECHNIQUE: Multidetector CT imaging of the abdomen and pelvis was performed using the standard protocol following bolus administration of intravenous contrast. RADIATION DOSE REDUCTION: This exam was performed according to the departmental dose-optimization program which includes automated exposure control, adjustment of the mA and/or kV according to patient size and/or use of iterative reconstruction technique. CONTRAST:  147m OMNIPAQUE IOHEXOL 300 MG/ML  SOLN COMPARISON:  12/29/2022 FINDINGS: Lower chest: Heart is normal size. Small amount of bilateral pleural fluid with associated bibasilar atelectasis as infection is less likely. These findings are worse compared to the recent prior exam. Hepatobiliary: Gallbladder is somewhat contracted. Liver and biliary tree are normal. Pancreas: Normal. Spleen: Normal. Adrenals/Urinary Tract: Adrenal glands are normal. Kidneys are normal in size without hydronephrosis or nephrolithiasis. Ureters and bladder are normal. Stomach/Bowel: Nasogastric tube has tip over the body of the stomach in the left upper quadrant. Interval decompression of the small bowel is normal in caliber as there is been interval decompression of the  previously seen dilated small bowel loops. Mild fecal retention over the rectum with mild associated wall thickening which could be seen with stercoral colitis. The sigmoid colon is redundant as there is new narrowing of a segment of sigmoid colon over the midline lower abdomen. This is not appear to be obstructive. Mild fecal retention throughout the remainder of the colon. No evidence of vascular compromise. No free peritoneal air. Vascular/Lymphatic: Aorta is normal caliber. No evidence of adenopathy. Remaining vascular structures are unremarkable. Reproductive: Normal. Other: Minimal air within the subcutaneous fat of the abdominal wall likely due  to recent intervention. Right inguinal hernia containing only peritoneal fat unchanged. Musculoskeletal: No focal abnormality. IMPRESSION: 1. Nasogastric tube present with interval decompression of the previously seen dilated small bowel loops. 2. Mild fecal retention over the rectum with mild associated wall thickening which could be seen with stercoral colitis. The sigmoid colon is redundant as there is new narrowing of a segment of sigmoid colon over the midline lower abdomen which does not appear to be obstructive. No definite volvulus or evidence of vascular compromise. 3. Small amount of bilateral pleural fluid with associated bibasilar atelectasis as infection is less likely. These findings are worse compared to the recent prior exam. 4. Right inguinal hernia containing only peritoneal fat unchanged. Electronically Signed   By: Marin Olp M.D.   On: 12/31/2022 21:58   DG Abd Portable 1V  Result Date: 12/31/2022 CLINICAL DATA:  Small bowel obstruction EXAM: PORTABLE ABDOMEN - 1 VIEW COMPARISON:  Yesterday FINDINGS: Enteric tube with tip at the stomach where there is enteric contrast. Diffuse gaseous distension of colon and somewhat affecting small bowel as well. No concerning mass effect or gas collection. IMPRESSION: Unchanged gas dilated bowel.  Electronically Signed   By: Jorje Guild M.D.   On: 12/31/2022 07:52   DG Abd Portable 1V-Small Bowel Obstruction Protocol-initial, 8 hr delay  Result Date: 12/30/2022 CLINICAL DATA:  SBO protocol.  8 hour delay image EXAM: PORTABLE ABDOMEN - 1 VIEW COMPARISON:  Radiograph 12/30/2022 at 7:14 a.m. FINDINGS: Persistent small bowel dilation similar to radiographs earlier today. Normal caliber colon with moderate stool burden. Enteric tube tip in the stomach with side port at the GE junction. Enteric contrast within the gastric fundus. No enteric contrast is seen beyond the stomach. IMPRESSION: Persistent small bowel obstruction. Enteric contrast has not progressed beyond the stomach. Electronically Signed   By: Placido Sou M.D.   On: 12/30/2022 20:45    Impression: Small bowel obstruction of unclear etiology No previous abdominal surgeries Patient possibly has underlying motility disorder complicated by his underlying condition, immobility, bedbound status NG tube return has been progressively decreasing from 2450 to 1110 to 950 in the last 24 hours Patient is receiving Dulcolax suppository and states he has been having several bowel movements  Plan: Motility disorder cannot be worked up as an inpatient. Gastroparesis can be evaluated with a gastric emptying scan however this cannot be done with the NG tube in place and patient has to be without narcotics, likely to be done as an outpatient. Patient likely developed small bowel obstruction with history of underlying chronic constipation. Recommend continuing NG tube to suction, monitoring with serial abdominal x-ray, avoiding narcotics, keeping potassium above 4 and magnesium above 2. Agree with Dulcolax suppository which can be given once a day. Once NG tube return becomes minimal, NG tube can be removed, timing as per surgical team's evaluation. Once NG tube is removed, recommend MiraLAX 17 g once a day, fiber supplement such as  Metamucil/Benefiber/psyllium husk once a day, and Dulcolax 10 mg once a day on a regular basis. This was discussed in details with the patient and his mother at bedside. Please recall GI if needed.   LOS: 3 days   Ronnette Juniper, MD  01/01/2023, 11:27 AM

## 2023-01-01 NOTE — Progress Notes (Signed)
[facility] PROGRESS NOTE  Carlos [name] (DOB: 03-May-1966) [id number] PCP: Carlos Rough, PA-C  Brief Narrative: Carlos [name] is a 58 year old male with history of spinal ependymoma status post surgery, psoriasis,bed bound  who presented with 5-day history of abdominal pain, nausea, no bowel movement no history of abdominal surgery in the past.  History of chronic constipation.  On presentation lab work showed sodium 132, potassium 3.4, WBC of 17.3.  CT scan showed functional low-grade SBO with large amount of stool in the colon.  Started on conservative management after admitted for the management of low-grade SBO. NG tube placed.  General surgery consulted and following. GI also consulted for concern of motility disorder. Patient has had return of bowel function, though continues with increased NG output. Plan is to observe for declining output, pull NGT, initiate bowel regimen.   Subjective: Still with sporadic hiccups that are bothersome described as upper chest spasm. No abdominal pain per se. Feels well at this time. Does not report and new respiratory or urinary symptoms.   Objective: BP 130/73 (BP Location: Right Arm)   Pulse (!) 117   Temp 99.3 F (37.4 C) (Oral)   Resp 18   Ht '6\' 4"'$  (1.93 m)   Wt 90.7 kg   SpO2 94%   BMI 24.34 kg/m   Gen: Pleasant 57yo M in no distress Pulm: Diminished at bases anterolaterally.  No wheezes, nonlabored. CV: RRR, no MRG. Trace dependent edema. GI: Soft, hypoactive bowel sounds without tenderness. Mildly distended.  Neuro: Alert and oriented. Stable contractures. No new focal deficits. Ext: Warm, well-perfused Skin: No acute rashes, lesions or ulcers on visualized skin   Port KUB: Personally reviewed. Nonobstructive gas pattern.  Assessment & Plan: SBO: Idiopathic. Has had BMs and declining NG output (initially 2,450 > 1,110 > 950cc/24hrs). Suspect bedbound status/immobility, chronic constipation, and possible underlying motility disorder  contributing.  - GI consulted, recommends gastroscintigraphy as an outpatient. - Will initiate miralax, metamucil, and dulcolax daily after NGT discontinued.  - Monitor NG output, clamping trial timing per surgery.  - IV fluids, antiemetics, pain medications for now.  - Continue hiccup management with thorazine  Fever: Pt has no symptoms any longer, but did have viral URI symptoms after contact with his son who had "a cold." Had fever develop 2/27 with stable CBC, normal lactic acid. Pt noted to have leukocytosis, though this has been noticed persistently back to 2020.  - Monitor cultures repeated 2/27 (NGTD), empirically initiated vancomycin and zosyn. Had S. epidermidis and S. cohnii on 1 of 2 blood cultures at admission more consistent with contaminant. Will deescalate abx if no culture growth 2/29. - CT abd/pelvis showed possible stercoral colitis with mild rectal fecal retention,new narrowing of a segment of sigmoid colon over the midline lower abdomen which does not appear to be obstructive. No definite volvulus or evidence of vascular compromise. 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 - HH to be arranged at discharge.   Psoriasis:  - Dermatology follow up recommended.   Carlos Pour, MD [facility] [email or web address] 01/01/2023, 3:01 PM

## 2023-01-01 NOTE — Plan of Care (Signed)
  Problem: Coping: Goal: Level of anxiety will decrease Outcome: Progressing   Problem: Elimination: Goal: Will not experience complications related to bowel motility Outcome: Progressing Goal: Will not experience complications related to urinary retention Outcome: Progressing   Problem: Pain Managment: Goal: General experience of comfort will improve Outcome: Progressing

## 2023-01-01 NOTE — Plan of Care (Signed)
  Problem: Education: Goal: Knowledge of General Education information will improve Description: Including pain rating scale, medication(s)/side effects and non-pharmacologic comfort measures Outcome: Progressing   Problem: Activity: Goal: Risk for activity intolerance will decrease Outcome: Progressing   Problem: Pain Managment: Goal: General experience of comfort will improve Outcome: Progressing   

## 2023-01-01 NOTE — Progress Notes (Addendum)
Patient ID: Neng Arntz, male   DOB: 1965/12/10, 57 y.o.   MRN: [id number] [facility] [place] Surgery Progress Note     Subjective: CC-  Main complaint today is hiccups and yesterday developed some "spasms". When I ask where the contractions/spasms are he points towards his upper chest/throat. Denies abdominal pain. BMx2 yesterday.  Reports only getting tylenol suppository. Feels the thorazine does help with hiccups.  Objective: Vital signs in last 24 hours: Temp:  [98.1 F (36.7 C)-101.6 F (38.7 C)] 100.5 F (38.1 C) (02/28 0811) Pulse Rate:  [106-128] 115 (02/28 0811) Resp:  [14-20] 18 (02/28 0811) BP: (113-142)/(72-97) 120/79 (02/28 0811) SpO2:  [91 %-97 %] 93 % (02/28 0811) Last BM Date : 01/01/23  Intake/Output from previous day: 02/27 0701 - 02/28 0700 In: 2667 [P.O.:95; I.V.:1764; IV Piggyback:808] Out: 2051 [Urine:1100; Emesis/NG output:950; Stool:1] Intake/Output this shift: No intake/output data recorded.  PE: Gen: Alert, NAD Abd: soft, mild distension, tympanu in the RUQ, non-tender, no masses, hernias, or organomegaly   Lab Results:  Recent Labs    12/31/22 1504 01/01/23 0349  WBC 11.5* 11.2*  HGB 13.9 12.3*  HCT 44.0 39.9  PLT 122* 173   BMET Recent Labs    12/31/22 0343 01/01/23 0349  NA 140 136  K 3.4* 3.8  CL 105 107  CO2 22 17*  GLUCOSE 73 100*  BUN 16 11  CREATININE 0.81 0.72  CALCIUM 7.7* 7.8*   PT/INR No results for input(s): "LABPROT", "INR" in the last 72 hours. CMP     Component Value Date/Time   NA 136 01/01/2023 0349   K 3.8 01/01/2023 0349   CL 107 01/01/2023 0349   CO2 17 (L) 01/01/2023 0349   GLUCOSE 100 (H) 01/01/2023 0349   BUN 11 01/01/2023 0349   CREATININE 0.72 01/01/2023 0349   CALCIUM 7.8 (L) 01/01/2023 0349   PROT 7.2 12/29/2022 0147   ALBUMIN 3.9 12/29/2022 0147   AST 16 12/29/2022 0147   ALT 14 12/29/2022 0147   ALKPHOS 50 12/29/2022 0147   BILITOT 0.7 12/29/2022 0147   GFRNONAA >60 01/01/2023 0349   GFRAA  >60 09/26/2019 0916   Lipase     Component Value Date/Time   LIPASE 31 12/29/2022 0147       Studies/Results: DG Abd Portable 1V  Result Date: 01/01/2023 CLINICAL DATA:  Small bowel obstruction. EXAM: PORTABLE ABDOMEN - 1 VIEW COMPARISON:  December 31, 2022. FINDINGS: Nasogastric tube tip is seen in proximal stomach. No abnormal bowel dilatation is noted. Moderate amount of stool is seen throughout the colon. IMPRESSION: No abnormal bowel dilatation. Electronically Signed   By: Marijo Conception M.D.   On: 01/01/2023 08:18   CT ABDOMEN PELVIS W CONTRAST  Result Date: 12/31/2022 CLINICAL DATA:  Abdominal pain and possible small bowel obstruction on previous CT. EXAM: CT ABDOMEN AND PELVIS WITH CONTRAST TECHNIQUE: Multidetector CT imaging of the abdomen and pelvis was performed using the standard protocol following bolus administration of intravenous contrast. RADIATION DOSE REDUCTION: This exam was performed according to the departmental dose-optimization program which includes automated exposure control, adjustment of the mA and/or kV according to patient size and/or use of iterative reconstruction technique. CONTRAST:  172m OMNIPAQUE IOHEXOL 300 MG/ML  SOLN COMPARISON:  12/29/2022 FINDINGS: Lower chest: Heart is normal size. Small amount of bilateral pleural fluid with associated bibasilar atelectasis as infection is less likely. These findings are worse compared to the recent prior exam. Hepatobiliary: Gallbladder is somewhat contracted. Liver and biliary tree are normal.  Pancreas: Normal. Spleen: Normal. Adrenals/Urinary Tract: Adrenal glands are normal. Kidneys are normal in size without hydronephrosis or nephrolithiasis. Ureters and bladder are normal. Stomach/Bowel: Nasogastric tube has tip over the body of the stomach in the left upper quadrant. Interval decompression of the small bowel is normal in caliber as there is been interval decompression of the previously seen dilated small bowel  loops. Mild fecal retention over the rectum with mild associated wall thickening which could be seen with stercoral colitis. The sigmoid colon is redundant as there is new narrowing of a segment of sigmoid colon over the midline lower abdomen. This is not appear to be obstructive. Mild fecal retention throughout the remainder of the colon. No evidence of vascular compromise. No free peritoneal air. Vascular/Lymphatic: Aorta is normal caliber. No evidence of adenopathy. Remaining vascular structures are unremarkable. Reproductive: Normal. Other: Minimal air within the subcutaneous fat of the abdominal wall likely due to recent intervention. Right inguinal hernia containing only peritoneal fat unchanged. Musculoskeletal: No focal abnormality. IMPRESSION: 1. Nasogastric tube present with interval decompression of the previously seen dilated small bowel loops. 2. Mild fecal retention over the rectum with mild associated wall thickening which could be seen with stercoral colitis. The sigmoid colon is redundant as there is new narrowing of a segment of sigmoid colon over the midline lower abdomen which does not appear to be obstructive. No definite volvulus or evidence of vascular compromise. 3. Small amount of bilateral pleural fluid with associated bibasilar atelectasis as infection is less likely. These findings are worse compared to the recent prior exam. 4. Right inguinal hernia containing only peritoneal fat unchanged. Electronically Signed   By: Marin Olp M.D.   On: 12/31/2022 21:58   DG Abd Portable 1V  Result Date: 12/31/2022 CLINICAL DATA:  Small bowel obstruction EXAM: PORTABLE ABDOMEN - 1 VIEW COMPARISON:  Yesterday FINDINGS: Enteric tube with tip at the stomach where there is enteric contrast. Diffuse gaseous distension of colon and somewhat affecting small bowel as well. No concerning mass effect or gas collection. IMPRESSION: Unchanged gas dilated bowel. Electronically Signed   By: Jorje Guild  M.D.   On: 12/31/2022 07:52   DG Abd Portable 1V-Small Bowel Obstruction Protocol-initial, 8 hr delay  Result Date: 12/30/2022 CLINICAL DATA:  SBO protocol.  8 hour delay image EXAM: PORTABLE ABDOMEN - 1 VIEW COMPARISON:  Radiograph 12/30/2022 at 7:14 a.m. FINDINGS: Persistent small bowel dilation similar to radiographs earlier today. Normal caliber colon with moderate stool burden. Enteric tube tip in the stomach with side port at the GE junction. Enteric contrast within the gastric fundus. No enteric contrast is seen beyond the stomach. IMPRESSION: Persistent small bowel obstruction. Enteric contrast has not progressed beyond the stomach. Electronically Signed   By: Placido Sou M.D.   On: 12/30/2022 20:45    Anti-infectives: Anti-infectives (From admission, onward)    Start     Dose/Rate Route Frequency Ordered Stop   12/31/22 1600  vancomycin (VANCOREADY) IVPB 1750 mg/350 mL        1,750 mg 175 mL/hr over 120 Minutes Intravenous Every 12 hours 12/31/22 1503     12/31/22 1600  piperacillin-tazobactam (ZOSYN) IVPB 3.375 g        3.375 g 12.5 mL/hr over 240 Minutes Intravenous Every 8 hours 12/31/22 1503          Assessment/Plan SBO - no prior h/o abdominal surgery - CT scan on admission reports transition point in the jejunum concerning for a low-grade or functional small-bowel obstruction;  the distal small bowel is decompressed. CT also shows a large amount of stool in the colon, as well as a distended and fluid-filled stomach. - CT repeated 2/27 due to hiccups and constant upper abdominal/esophageal spasms- SBO resolved, stomach remains distended with retained contrast and with high NG output. No emergent surgical needs but I do wonder if he has an underlying gastric/intestinal motility issue, along with significant chronic constipation. I will ask GI for their input.  - continue NGT, I will add a dulcolax suppository today  - general surgery will follow for now   ID - none VTE  - SCDs, lovenox FEN - IVF, NPO/NGT to LIWS Foley - none  History of spinal cord ependymoma  Bedbound Chronic constipation Hx psoriasis  I reviewed hospitalist notes, last 24 h vitals and pain scores, last 48 h intake and output, last 24 h labs and trends, and last 24 h imaging results.    LOS: 3 days    [facility] 01/01/2023, 10:42 AM Please see Amion for pager number during day hours 7:00am-4:30pm

## 2023-01-01 NOTE — Progress Notes (Signed)
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    PT Comments    Pt pulled up with RUE on bedrail into long sitting in bed. He was able to maintain trunk upright for 3 minutes, then for 1 minute, tolerance limited by trunk fatigue. Pt performed cervical and trunk AROM while in sitting. Pt performed BLE ROM exercises as well.     Recommendations for follow up therapy are one component of a multi-disciplinary discharge planning process, led by the attending physician.  Recommendations may be updated based on patient status, additional functional criteria and insurance authorization.  Follow Up Recommendations  Home health PT     Assistance Recommended at Discharge    Patient can return home with the following Two people to help with walking and/or transfers;A lot of help with bathing/dressing/bathroom;Assist for transportation;Help with stairs or ramp for entrance   Equipment Recommendations  None recommended by PT    Recommendations for Other Services       Precautions / Restrictions Precautions Precautions: Fall Precaution Comments: NG tube Restrictions Weight Bearing Restrictions: No     Mobility  Bed Mobility Overal bed mobility: Needs Assistance             General bed mobility comments: pt able to pull trunk into long sitting position with use of bed rail on Rt, typically able to assist with rolling per pt. Pt sat in long sitting for 3 minutes then for 1 minute. Pt performed trunk rotation, neck rotation, neck lateral flexion AROM while in long sitting.Sitting tolerance limited by trunk muscle fatigue. Pt declined sitting edge of bed.    Transfers                   General transfer comment: pt  declined    Ambulation/Gait                   Stairs             Wheelchair Mobility    Modified Rankin (Stroke Patients Only)       Balance Overall balance assessment: Needs assistance Sitting-balance support: Single extremity supported, Feet supported Sitting balance-Leahy Scale: Poor Sitting balance - Comments: sat in long sitting in bed, RUE supported on rail. Sat 3 minutes then for 1 minute. Tolerance limited by trunk fatigue. Performed cervical and trunk AROM while in sitting.                                    Cognition Arousal/Alertness: Awake/alert Behavior During Therapy: WFL for tasks assessed/performed Overall Cognitive Status: Within Functional Limits for tasks assessed                                          Exercises General Exercises - Lower Extremity Ankle Circles/Pumps: AROM, Both, 15 reps, Supine Heel Slides: AAROM, Both, 10 reps, Supine    General Comments        Pertinent Vitals/Pain Pain Assessment Pain Assessment: No/denies pain    Home Living  Prior Function            PT Goals (current goals can now be found in the care plan section) Acute Rehab PT Goals PT Goal Formulation: With patient Time For Goal Achievement: 01/12/23 Potential to Achieve Goals: Fair Progress towards PT goals: Progressing toward goals    Frequency    Min 2X/week      PT Plan Current plan remains appropriate    Co-evaluation              AM-PAC PT "6 Clicks" Mobility   Outcome Measure  Help needed turning from your back to your side while in a flat bed without using bedrails?: A Lot Help needed moving from lying on your back to sitting on the side of a flat bed without using bedrails?: A Lot Help needed moving to and from a bed to a chair (including a wheelchair)?: Total Help needed standing up from a chair using your arms (e.g., wheelchair or bedside chair)?:  Total Help needed to walk in hospital room?: Total Help needed climbing 3-5 steps with a railing? : Total 6 Click Score: 8    End of Session   Activity Tolerance: Patient limited by fatigue;Patient tolerated treatment well Patient left: in bed;with call bell/phone within reach Nurse Communication: Mobility status PT Visit Diagnosis: Other symptoms and signs involving the nervous system (R29.898);Muscle weakness (generalized) (M62.81)     Time: [id number] PT Time Calculation (min) (ACUTE ONLY): 22 min  Charges:  $Therapeutic Activity: 8-22 mins                     Philomena Doheny PT 01/01/2023  Acute Rehabilitation Services  Office [phone]

## 2023-01-01 NOTE — Progress Notes (Signed)
Initial Nutrition Assessment  DOCUMENTATION CODES:   Not applicable  INTERVENTION:  - Advance diet as medically appropriate.  - Recommend Boost Breeze po TID once diet advanced, each supplement provides 250 kcal and 9 grams of protein  - If unable to advance oral diet within the next 1-2 days would recommend TPN as patient on day 4 of NPO.  - Monitor weight trends.   NUTRITION DIAGNOSIS:   Inadequate oral intake related to inability to eat as evidenced by NPO status.  GOAL:   Patient will meet greater than or equal to 90% of their needs  MONITOR:   Diet advancement, Weight trends  REASON FOR ASSESSMENT:   Malnutrition Screening Tool    ASSESSMENT:   57 y.o. male with past medical history of spinal ependymoma status post surgery, bedbound, admitted on 12/28/2022 with abdominal pain, nausea and vomiting. Concern for SBO versus motility disorder.   Met with patient and mother at bedside this afternoon. Patient reports a UBW of 200# and notes he has had some changes over the past year due to being mostly bedbound (uses wheelchair). Patient admitted at reported UBW of 200#.   He endorses eating 2 meals a day (breakfast and dinner), only occasionally has lunch. Has been intermittently drinking Premier Protein at home. Appetite depends on how his stomach is feeling as he has a history of constipation.    Patient currently NPO with NGT to LIS. He is having hiccups and passing gas and it very hopeful to be able to eat again soon. He is agreeable to try nutrition supplements once diet advanced.  Patient on day 4 of NPO, would recommend NPO if diet unable to be advanced within the next 1-2 days.   Medications reviewed and include: Vancomycin, Zosyn  Labs reviewed:  -   NUTRITION - FOCUSED PHYSICAL EXAM:  Flowsheet Row Most Recent Value  Orbital Region No depletion  Upper Arm Region Mild depletion  Thoracic and Lumbar Region No depletion  Buccal Region No depletion   Temple Region Mild depletion  Clavicle Bone Region Mild depletion  Clavicle and Acromion Bone Region Mild depletion  Scapular Bone Region Unable to assess  Dorsal Hand No depletion  Patellar Region Moderate depletion  [mostly bedbound for 1 year per pt report]  Anterior Thigh Region Moderate depletion  [mostly bedbound for 1 year per pt report]  Posterior Calf Region Moderate depletion  [mostly bedbound for 1 year per pt report]  Edema (RD Assessment) None  Hair Reviewed  Eyes Reviewed  Mouth Reviewed  Skin Reviewed  Nails Reviewed       Diet Order:   Diet Order             Diet NPO time specified Except for: Ice Chips  Diet effective now                   EDUCATION NEEDS:  Education needs have been addressed  Skin:  Skin Assessment: Reviewed RN Assessment  Last BM:  2/28  Height:  Ht Readings from Last 1 Encounters:  12/28/22 '6\' 4"'$  (1.93 m)   Weight:  Wt Readings from Last 1 Encounters:  12/28/22 90.7 kg    BMI:  Body mass index is 24.34 kg/m.  Estimated Nutritional Needs:  Kcal:  2250-2450 kcals Protein:  100-115 grams Fluid:  >/= 2.2L    Samson Frederic RD, LDN For contact information, refer to [facility].

## 2023-01-02 DIAGNOSIS — K5669 Other partial intestinal obstruction: Secondary | ICD-10-CM | POA: Diagnosis not present

## 2023-01-02 DIAGNOSIS — K56609 Unspecified intestinal obstruction, unspecified as to partial versus complete obstruction: Secondary | ICD-10-CM | POA: Diagnosis not present

## 2023-01-02 DIAGNOSIS — C720 Malignant neoplasm of spinal cord: Secondary | ICD-10-CM | POA: Diagnosis not present

## 2023-01-02 LAB — RESP PANEL BY RT-PCR (RSV, FLU A&B, COVID)  RVPGX2
Influenza A by PCR: NEGATIVE
Influenza B by PCR: NEGATIVE
Resp Syncytial Virus by PCR: NEGATIVE
SARS Coronavirus 2 by RT PCR: POSITIVE — AB

## 2023-01-02 LAB — CREATININE, SERUM
Creatinine, Ser: 0.64 mg/dL (ref 0.61–1.24)
GFR, Estimated: >60 mL/min (ref >60)

## 2023-01-02 MED ORDER — MELATONIN 3 MG PO TABS
3.0000 mg | ORAL_TABLET | Freq: Every day | ORAL | Status: DC
  Administered 2023-01-02 – 2023-01-11 (×10): 3 mg via ORAL
  Filled 2023-01-02 (×10): qty 1

## 2023-01-02 MED ORDER — METOCLOPRAMIDE HCL 5 MG/ML IJ SOLN
5.0000 mg | Freq: Four times a day (QID) | INTRAMUSCULAR | Status: DC
  Administered 2023-01-02 – 2023-01-04 (×9): 5 mg via INTRAVENOUS
  Filled 2023-01-02 (×9): qty 2

## 2023-01-02 NOTE — Progress Notes (Signed)
PHARMACY - PHYSICIAN COMMUNICATION CRITICAL VALUE ALERT - BLOOD CULTURE IDENTIFICATION (BCID)  Carlos [name] is an 57 y.o. male who presented to [facility] on 12/28/2022 with SBO. Patient spiked fever 2/27 and was started on vancomycin and Zosyn.   Assessment:   2/25 Bcx: 2/4 bottles (1/2 sets) staph epi, staph cohnii  Update today -  2/27 Bcx: 1/2 bottles (only one set drawn) GPC in clusters - no BCID given recent culture results  Name of physician (or Provider) Contacted: Dr. Bonner Puna  Current antibiotics: vancomycin, Zosyn  Changes to prescribed antibiotics recommended: none, plan to consult ID  Results for orders placed or performed during the hospital encounter of 12/28/22  Blood Culture ID Panel (Reflexed) (Collected: 12/29/2022  3:22 AM)  Result Value Ref Range   Enterococcus faecalis NOT DETECTED NOT DETECTED   Enterococcus Faecium NOT DETECTED NOT DETECTED   Listeria monocytogenes NOT DETECTED NOT DETECTED   Staphylococcus species DETECTED (A) NOT DETECTED   Staphylococcus aureus (BCID) NOT DETECTED NOT DETECTED   Staphylococcus epidermidis DETECTED (A) NOT DETECTED   Staphylococcus lugdunensis NOT DETECTED NOT DETECTED   Streptococcus species NOT DETECTED NOT DETECTED   Streptococcus agalactiae NOT DETECTED NOT DETECTED   Streptococcus pneumoniae NOT DETECTED NOT DETECTED   Streptococcus pyogenes NOT DETECTED NOT DETECTED   A.calcoaceticus-baumannii NOT DETECTED NOT DETECTED   Bacteroides fragilis NOT DETECTED NOT DETECTED   Enterobacterales NOT DETECTED NOT DETECTED   Enterobacter cloacae complex NOT DETECTED NOT DETECTED   Escherichia coli NOT DETECTED NOT DETECTED   Klebsiella aerogenes NOT DETECTED NOT DETECTED   Klebsiella oxytoca NOT DETECTED NOT DETECTED   Klebsiella pneumoniae NOT DETECTED NOT DETECTED   Proteus species NOT DETECTED NOT DETECTED   Salmonella species NOT DETECTED NOT DETECTED   Serratia marcescens NOT DETECTED NOT DETECTED   Haemophilus influenzae  NOT DETECTED NOT DETECTED   Neisseria meningitidis NOT DETECTED NOT DETECTED   Pseudomonas aeruginosa NOT DETECTED NOT DETECTED   Stenotrophomonas maltophilia NOT DETECTED NOT DETECTED   Candida albicans NOT DETECTED NOT DETECTED   Candida auris NOT DETECTED NOT DETECTED   Candida glabrata NOT DETECTED NOT DETECTED   Candida krusei NOT DETECTED NOT DETECTED   Candida parapsilosis NOT DETECTED NOT DETECTED   Candida tropicalis NOT DETECTED NOT DETECTED   Cryptococcus neoformans/gattii NOT DETECTED NOT DETECTED   Methicillin resistance mecA/C NOT DETECTED NOT [place], PharmD, BCPS Clinical Pharmacist 01/02/2023 4:05 PM

## 2023-01-02 NOTE — Progress Notes (Signed)
Patient ID: Carlos [name], male   DOB: June 29, 1966, 57 y.o.   MRN: [id number] [facility] Surgery Progress Note     Subjective: CC-  Feeling a little better today. Still having some hiccups but less. Continues to pass flatus, and had a good BM yesterday.  Objective: Vital signs in last 24 hours: Temp:  [98.4 F (36.9 C)-99.7 F (37.6 C)] 98.4 F (36.9 C) (02/29 0448) Pulse Rate:  [101-120] 101 (02/29 0448) Resp:  [18-19] 19 (02/29 0448) BP: (127-137)/(73-84) 137/84 (02/29 0448) SpO2:  [93 %-97 %] 97 % (02/29 0448) Last BM Date : 01/01/23  Intake/Output from previous day: 02/28 0701 - 02/29 0700 In: 3164.3 [I.V.:2222.5; NG/GT:60; IV Piggyback:881.8] Out: 2230 [Urine:955; Emesis/NG output:1275] Intake/Output this shift: No intake/output data recorded.  PE: Gen: Alert, NAD Abd: soft, mild distension, non-tender, no masses, hernias, or organomegaly   Lab Results:  Recent Labs    12/31/22 1504 01/01/23 0349  WBC 11.5* 11.2*  HGB 13.9 12.3*  HCT 44.0 39.9  PLT 122* 173   BMET Recent Labs    12/31/22 0343 01/01/23 0349 01/02/23 0319  NA 140 136  --   K 3.4* 3.8  --   CL 105 107  --   CO2 22 17*  --   GLUCOSE 73 100*  --   BUN 16 11  --   CREATININE 0.81 0.72 0.64  CALCIUM 7.7* 7.8*  --    PT/INR No results for input(s): "LABPROT", "INR" in the last 72 hours. CMP     Component Value Date/Time   NA 136 01/01/2023 0349   K 3.8 01/01/2023 0349   CL 107 01/01/2023 0349   CO2 17 (L) 01/01/2023 0349   GLUCOSE 100 (H) 01/01/2023 0349   BUN 11 01/01/2023 0349   CREATININE 0.64 01/02/2023 0319   CALCIUM 7.8 (L) 01/01/2023 0349   PROT 7.2 12/29/2022 0147   ALBUMIN 3.9 12/29/2022 0147   AST 16 12/29/2022 0147   ALT 14 12/29/2022 0147   ALKPHOS 50 12/29/2022 0147   BILITOT 0.7 12/29/2022 0147   GFRNONAA >60 01/02/2023 0319   GFRAA >60 09/26/2019 0916   Lipase     Component Value Date/Time   LIPASE 31 12/29/2022 0147       Studies/Results: DG Abd  Portable 1V  Result Date: 01/01/2023 CLINICAL DATA:  Small bowel obstruction. EXAM: PORTABLE ABDOMEN - 1 VIEW COMPARISON:  December 31, 2022. FINDINGS: Nasogastric tube tip is seen in proximal stomach. No abnormal bowel dilatation is noted. Moderate amount of stool is seen throughout the colon. IMPRESSION: No abnormal bowel dilatation. Electronically Signed   By: Marijo Conception M.D.   On: 01/01/2023 08:18   CT ABDOMEN PELVIS W CONTRAST  Result Date: 12/31/2022 CLINICAL DATA:  Abdominal pain and possible small bowel obstruction on previous CT. EXAM: CT ABDOMEN AND PELVIS WITH CONTRAST TECHNIQUE: Multidetector CT imaging of the abdomen and pelvis was performed using the standard protocol following bolus administration of intravenous contrast. RADIATION DOSE REDUCTION: This exam was performed according to the departmental dose-optimization program which includes automated exposure control, adjustment of the mA and/or kV according to patient size and/or use of iterative reconstruction technique. CONTRAST:  167m OMNIPAQUE IOHEXOL 300 MG/ML  SOLN COMPARISON:  12/29/2022 FINDINGS: Lower chest: Heart is normal size. Small amount of bilateral pleural fluid with associated bibasilar atelectasis as infection is less likely. These findings are worse compared to the recent prior exam. Hepatobiliary: Gallbladder is somewhat contracted. Liver and biliary tree are normal. Pancreas:  Normal. Spleen: Normal. Adrenals/Urinary Tract: Adrenal glands are normal. Kidneys are normal in size without hydronephrosis or nephrolithiasis. Ureters and bladder are normal. Stomach/Bowel: Nasogastric tube has tip over the body of the stomach in the left upper quadrant. Interval decompression of the small bowel is normal in caliber as there is been interval decompression of the previously seen dilated small bowel loops. Mild fecal retention over the rectum with mild associated wall thickening which could be seen with stercoral colitis. The  sigmoid colon is redundant as there is new narrowing of a segment of sigmoid colon over the midline lower abdomen. This is not appear to be obstructive. Mild fecal retention throughout the remainder of the colon. No evidence of vascular compromise. No free peritoneal air. Vascular/Lymphatic: Aorta is normal caliber. No evidence of adenopathy. Remaining vascular structures are unremarkable. Reproductive: Normal. Other: Minimal air within the subcutaneous fat of the abdominal wall likely due to recent intervention. Right inguinal hernia containing only peritoneal fat unchanged. Musculoskeletal: No focal abnormality. IMPRESSION: 1. Nasogastric tube present with interval decompression of the previously seen dilated small bowel loops. 2. Mild fecal retention over the rectum with mild associated wall thickening which could be seen with stercoral colitis. The sigmoid colon is redundant as there is new narrowing of a segment of sigmoid colon over the midline lower abdomen which does not appear to be obstructive. No definite volvulus or evidence of vascular compromise. 3. Small amount of bilateral pleural fluid with associated bibasilar atelectasis as infection is less likely. These findings are worse compared to the recent prior exam. 4. Right inguinal hernia containing only peritoneal fat unchanged. Electronically Signed   By: Marin Olp M.D.   On: 12/31/2022 21:58    Anti-infectives: Anti-infectives (From admission, onward)    Start     Dose/Rate Route Frequency Ordered Stop   12/31/22 1600  vancomycin (VANCOREADY) IVPB 1750 mg/350 mL        1,750 mg 175 mL/hr over 120 Minutes Intravenous Every 12 hours 12/31/22 1503     12/31/22 1600  piperacillin-tazobactam (ZOSYN) IVPB 3.375 g        3.375 g 12.5 mL/hr over 240 Minutes Intravenous Every 8 hours 12/31/22 1503          Assessment/Plan SBO - no prior h/o abdominal surgery - CT scan on admission reports transition point in the jejunum concerning for  a low-grade or functional small-bowel obstruction; the distal small bowel is decompressed. CT also shows a large amount of stool in the colon, as well as a distended and fluid-filled stomach. - CT repeated 2/27 due to hiccups and constant upper abdominal/esophageal spasms- SBO resolved, stomach remains distended with retained contrast and with high NG output. No emergent surgical needs but suspect underlying gastric/intestinal motility issue, along with significant chronic constipation. GI has seen 2/28 with reccs for bowel regimen, outpatient gastroparesis work up. - Clamp NGT today and give sips of clears - if patient develops worsening abdominal pain, bloating, n/v then return NG to LIWS. Will also add scheduled reglan. Continue daily suppositories.   ID - none VTE - SCDs, lovenox FEN - IVF, clamp NG, sips of clears Foley - none   History of spinal cord ependymoma  Bedbound Chronic constipation Hx psoriasis   I reviewed hospitalist notes, last 24 h vitals and pain scores, last 48 h intake and output, last 24 h labs and trends, and last 24 h imaging results.    LOS: 4 days    Wellington Hampshire, PA-C [facility] 01/02/2023, 9:00 AM Please see Amion for pager number during day hours 7:00am-4:30pm

## 2023-01-02 NOTE — Progress Notes (Signed)
[facility] PROGRESS NOTE  Carlos [name] (DOB: 1966/08/19) [id number] PCP: Nicholes Rough, PA-C  Brief Narrative: Carlos [name] is a 57 year old male with history of spinal ependymoma status post surgery, psoriasis,bed bound  who presented with 5-day history of abdominal pain, nausea, no bowel movement no history of abdominal surgery in the past.  History of chronic constipation.  On presentation lab work showed sodium 132, potassium 3.4, WBC of 17.3.  CT scan showed functional low-grade SBO with large amount of stool in the colon.  Started on conservative management after admitted for the management of low-grade SBO. NG tube placed.  General surgery consulted and following. GI also consulted for concern of motility disorder. Patient has had return of bowel function, though continues with increased NG output. Plan is to observe for declining output, pull NGT, initiate bowel regimen.   Subjective: Feels some improvement, less hiccups, had large BM yesterday. No abd pain. Said he had nasal congestion yesterday. No new cough or dyspnea or urinary complaints.  Objective: BP 137/84 (BP Location: Left Arm)   Pulse (!) 101   Temp 98.4 F (36.9 C) (Oral)   Resp 19   Ht '6\' 4"'$  (1.93 m)   Wt 90.7 kg   SpO2 97%   BMI 24.34 kg/m   Gen: Pleasant male in no distress HEENT: NG with dark output from left nare Pulm: Clear, nonlabored, diminished at bases  CV: RRR, no MRG. Trace pitting edema.  GI: Soft, NT, ND, +BS  Neuro: Alert and oriented. Stable deficits and contractures. No new focal deficits. Skin: No rashes, lesions or ulcers on visualized skin   Assessment & Plan: SBO: Idiopathic. Has had BMs and declining NG output (initially 2,450 > 1,110 > 950cc/24hrs). Suspect bedbound status/immobility, chronic constipation, and possible underlying motility disorder contributing.  - GI consulted, recommends gastroscintigraphy as an outpatient. No recommendations Re: reglan, though I agree with starting  this empirically given high degree of suspicion for dysmotility.  - Will initiate miralax, metamucil, and dulcolax daily after NGT discontinued.  - Monitor NG output, tolerated clamping trial yesterday and still having BM (large 2/28), though output continues significant today. Repeat trial today per surgery.  - IV fluids, antiemetics, pain medications for now.  - Continue hiccup management with thorazine  Fever: Pt has no symptoms any longer, but did have viral URI symptoms after contact with his son who had "a cold." Had fever develop 2/27 with stable CBC, normal lactic acid. Pt noted to have leukocytosis, though this has been noticed persistently back to 2020.  - Monitor cultures repeated 2/27 (NGTD), empirically initiated vancomycin and zosyn. Had S. epidermidis and S. cohnii on 1 of 2 blood cultures at admission more consistent with contaminant. Will deescalate abx if no culture growth. Still pending (no results available yet). - CT abd/pelvis showed possible stercoral colitis with mild rectal fecal retention,new narrowing of a segment of sigmoid colon over the midline lower abdomen which does not appear to be obstructive. No definite volvulus or evidence of vascular compromise. - Incentive spirometry  Insomnia:  - Melatonin qHS if able to clamp NG.  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 - HH to be arranged at discharge.   Psoriasis:  - Dermatology follow up recommended.   [name], MD [facility] [email or web address] 01/02/2023, 12:13 PM
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
## 2023-01-02 NOTE — Progress Notes (Signed)
Mobility Specialist - Progress Note   01/02/23 1338  Mobility  Activity  (Bed Level Exercises)  Range of Motion/Exercises Passive;Right arm  Activity Response Tolerated well  Mobility Referral Yes  $Mobility charge 1 Mobility   Pt received in bed and agreeable to do bed level exercises. Pt was only able to do exercises w/ R hand. Pt used yellow theraband & had no complaints during session. Pt to bed after session with all needs met.    Supine BUE Exercises: 5 reps each,  yellow resistance band  1) Elbow Flexion  3) Horizontal Abduction   Set designer

## 2023-01-03 DIAGNOSIS — K56609 Unspecified intestinal obstruction, unspecified as to partial versus complete obstruction: Secondary | ICD-10-CM | POA: Diagnosis not present

## 2023-01-03 DIAGNOSIS — C720 Malignant neoplasm of spinal cord: Secondary | ICD-10-CM | POA: Diagnosis not present

## 2023-01-03 DIAGNOSIS — Z419 Encounter for procedure for purposes other than remedying health state, unspecified: Secondary | ICD-10-CM | POA: Diagnosis not present

## 2023-01-03 DIAGNOSIS — K5669 Other partial intestinal obstruction: Secondary | ICD-10-CM | POA: Diagnosis not present

## 2023-01-03 LAB — BASIC METABOLIC PANEL
Anion gap: 12 (ref 5–15)
BUN: 8 mg/dL (ref 6–20)
CO2: 17 mmol/L — ABNORMAL LOW (ref 22–32)
Calcium: 7.7 mg/dL — ABNORMAL LOW (ref 8.9–10.3)
Chloride: 109 mmol/L (ref 98–111)
Creatinine, Ser: 0.58 mg/dL — ABNORMAL LOW (ref 0.61–1.24)
GFR, Estimated: >60 mL/min (ref >60)
Glucose, Bld: 87 mg/dL (ref 70–99)
Potassium: 3 mmol/L — ABNORMAL LOW (ref 3.5–5.1)
Sodium: 138 mmol/L (ref 135–145)

## 2023-01-03 LAB — CULTURE, BLOOD (ROUTINE X 2)
Culture: NO GROWTH
Special Requests: ADEQUATE

## 2023-01-03 LAB — RESPIRATORY PANEL BY PCR

## 2023-01-03 LAB — CBC
HCT: 36.7 % — ABNORMAL LOW (ref 39.0–52.0)
Hemoglobin: 11.4 g/dL — ABNORMAL LOW (ref 13.0–17.0)
MCH: 27 pg (ref 26.0–34.0)
MCHC: 31.1 g/dL (ref 30.0–36.0)
MCV: 86.8 fL (ref 80.0–100.0)
Platelets: 224 10*3/uL (ref 150–400)
RBC: 4.23 MIL/uL (ref 4.22–5.81)
RDW: 14.1 % (ref 11.5–15.5)
WBC: 12.1 10*3/uL — ABNORMAL HIGH (ref 4.0–10.5)
nRBC: 0 % (ref 0.0–0.2)

## 2023-01-03 MED ORDER — NIRMATRELVIR/RITONAVIR (PAXLOVID)TABLET
3.0000 | ORAL_TABLET | Freq: Two times a day (BID) | ORAL | Status: AC
  Administered 2023-01-03 – 2023-01-07 (×10): 3 via ORAL
  Filled 2023-01-03: qty 30

## 2023-01-03 MED ORDER — KCL IN DEXTROSE-NACL 40-5-0.45 MEQ/L-%-% IV SOLN
INTRAVENOUS | Status: DC
  Filled 2023-01-03 (×6): qty 1000

## 2023-01-03 MED ORDER — OXYMETAZOLINE HCL 0.05 % NA SOLN
1.0000 | Freq: Two times a day (BID) | NASAL | Status: AC
  Administered 2023-01-03 – 2023-01-04 (×3): 1 via NASAL
  Filled 2023-01-03: qty 15

## 2023-01-03 NOTE — Progress Notes (Signed)
Patient ID: Carlos [name], male   DOB: [birth year], 57 y.o.   MRN: [id number] [facility] Surgery Progress Note     Subjective: Tolerated NG clamping all day yesterday and overnight, denies nausea/vomiting. Taking sips of water. Having regular bowel movements. Tested positive for COVID.   Objective: Vital signs in last 24 hours: Temp:  [98.1 F (36.7 C)-98.6 F (37 C)] 98.3 F (36.8 C) (03/01 0620) Pulse Rate:  [99-109] 99 (03/01 0620) Resp:  [16-18] 17 (03/01 0620) BP: (124-132)/(74-83) 124/83 (03/01 0620) SpO2:  [94 %-97 %] 97 % (03/01 0620) Last BM Date : 01/02/23  Intake/Output from previous day: 02/29 0701 - 03/01 0700 In: 240 [P.O.:240] Out: 975 [Urine:975] Intake/Output this shift: Total I/O In: -  Out: 150 [Urine:150]  PE: Gen: Alert, NAD HEENT: NG tube clamped Abd: soft, nondistended, nontender  Lab Results:  Recent Labs    01/01/23 0349 01/03/23 0421  WBC 11.2* 12.1*  HGB 12.3* 11.4*  HCT 39.9 36.7*  PLT 173 224   BMET Recent Labs    01/01/23 0349 01/02/23 0319 01/03/23 0421  NA 136  --  138  K 3.8  --  3.0*  CL 107  --  109  CO2 17*  --  17*  GLUCOSE 100*  --  87  BUN 11  --  8  CREATININE 0.72 0.64 0.58*  CALCIUM 7.8*  --  7.7*   PT/INR No results for input(s): "LABPROT", "INR" in the last 72 hours. CMP     Component Value Date/Time   NA 138 01/03/2023 0421   K 3.0 (L) 01/03/2023 0421   CL 109 01/03/2023 0421   CO2 17 (L) 01/03/2023 0421   GLUCOSE 87 01/03/2023 0421   BUN 8 01/03/2023 0421   CREATININE 0.58 (L) 01/03/2023 0421   CALCIUM 7.7 (L) 01/03/2023 0421   PROT 7.2 12/29/2022 0147   ALBUMIN 3.9 12/29/2022 0147   AST 16 12/29/2022 0147   ALT 14 12/29/2022 0147   ALKPHOS 50 12/29/2022 0147   BILITOT 0.7 12/29/2022 0147   GFRNONAA >60 01/03/2023 0421   GFRAA >60 09/26/2019 0916   Lipase     Component Value Date/Time   LIPASE 31 12/29/2022 0147       Studies/Results: No results  found.  Anti-infectives: Anti-infectives (From admission, onward)    Start     Dose/Rate Route Frequency Ordered Stop   01/03/23 0600  nirmatrelvir/ritonavir (PAXLOVID) 3 tablet        3 tablet Oral 2 times daily 01/03/23 0304 01/08/23 0959   12/31/22 1600  vancomycin (VANCOREADY) IVPB 1750 mg/350 mL  Status:  Discontinued        1,750 mg 175 mL/hr over 120 Minutes Intravenous Every 12 hours 12/31/22 1503 01/02/23 1733   12/31/22 1600  piperacillin-tazobactam (ZOSYN) IVPB 3.375 g  Status:  Discontinued        3.375 g 12.5 mL/hr over 240 Minutes Intravenous Every 8 hours 12/31/22 1503 01/03/23 0601        Assessment/Plan SBO - no prior h/o abdominal surgery - CT scan on admission reports transition point in the jejunum concerning for a low-grade or functional small-bowel obstruction; the distal small bowel is decompressed. CT also shows a large amount of stool in the colon, as well as a distended and fluid-filled stomach. - CT repeated 2/27 due to hiccups and constant upper abdominal/esophageal spasms- SBO resolved, stomach remains distended with retained contrast and with high NG output. 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 Recommend GI follow  up for further workup. - Remove NG today and allow clear liquids. - Continue aggressive bowel regimen, add on oral regimen once tolerating PO intake.    History of spinal cord ependymoma  Bedbound Chronic constipation Hx psoriasis   I reviewed hospitalist notes, last 24 h vitals and pain scores, last 48 h intake and output, last 24 h labs and trends, and last 24 h imaging results.    LOS: 5 days    Dwan Bolt, [facility] 01/03/2023, 8:34 AM Please see Amion for pager number during day hours 7:00am-4:30pm

## 2023-01-03 NOTE — Progress Notes (Signed)
[facility] PROGRESS NOTE  Carlos [name] (DOB: 06/02/1966) [id number] PCP: Nicholes Rough, PA-C  Brief Narrative: Carlos [name] is a 57 year old male with history of spinal ependymoma status post surgery, psoriasis,bed bound  who presented with 5-day history of abdominal pain, nausea, no bowel movement no history of abdominal surgery in the past.  History of chronic constipation.  On presentation lab work showed sodium 132, potassium 3.4, WBC of 17.3.  CT scan showed functional low-grade SBO with large amount of stool in the colon.  Started on conservative management after admitted for the management of low-grade SBO. NG tube placed.  General surgery consulted and following. GI also consulted for concern of motility disorder. Patient has had return of bowel function, NG tube out 3/1. Developed fever with positive sick contact and viral panel positive for covid-19 infection 2/29.  Subjective: Nasal congestion and decreased cough. Excited about removal of NG tube. No abd pain reported. +BM.  Objective: BP 124/83 (BP Location: Left Arm)   Pulse 99   Temp 98.3 F (36.8 C) (Oral)   Resp 17   Ht '6\' 4"'$  (1.93 m)   Wt 90.7 kg   SpO2 97%   BMI 24.34 kg/m   Gen: No distress, pleasant Pulm: Clear, nonlabored  CV: RRR, no edema GI: Soft, NT, ND, +BS  Neuro: Alert and oriented. Stable contractures. No new focal deficits. Ext: Warm and dry Skin: No rashes, lesions or ulcers on visualized skin   Assessment & Plan: SBO: Idiopathic. Has had BMs and declining NG output (initially 2,450 > 1,110 > 950cc/24hrs). Suspect bedbound status/immobility, chronic constipation, and possible underlying motility disorder contributing.  - GI consulted, recommends gastroscintigraphy as an outpatient. No recommendations Re: reglan, though I agree with starting this empirically given high degree of suspicion for dysmotility.  - Will initiate miralax, metamucil, and dulcolax daily after NGT discontinued and tolerating  po. Starting clears per surgery today. - DC NGT today per surgery, tolerated clamping trial yesterday   - IV fluids, antiemetics, pain medications for now.  - Continue hiccup management with thorazine  Covid-19 infection:  - Paxlovid initiated - Airborne/contact precautions - Supportive care including afrin nasal spray and incentive spirometry  Contaminated blood cultures: Had S. epidermidis and S. cohnii on 1 of 2 blood cultures. Empirically given broad IV abx for recurrent fever with a single collection of blood culture again growing GPC in clusters. ID consulted, agree that this is again consistent with contaminant.  - Monitor off abx for now.   Insomnia:  - Melatonin qHS if able to clamp NG.  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 - HH to be arranged at discharge.   Psoriasis:  - Dermatology follow up recommended.   [name], MD [facility] [email or web address] 01/03/2023, 9:43 AM

## 2023-01-03 NOTE — Progress Notes (Signed)
Mobility Specialist - Progress Note   01/03/23 1345  Mobility  Activity  (Bed Level Exercises)  Range of Motion/Exercises Right arm;Passive  Activity Response Tolerated well  Mobility Referral Yes  $Mobility charge 1 Mobility   Pt received in bed and agreeable to do bed level exercises. No complaints during session. Pt to bed w/ all necessities in reach.  Supine BUE Exercises: 5 reps each,  yellow resistance band  1) Elbow Flexion  3) Horizontal Abduction    Set designer

## 2023-01-03 NOTE — Plan of Care (Signed)
Problem: Clinical Measurements: Goal: Ability to maintain clinical measurements within normal limits will improve Outcome: Progressing   Problem: Activity: Goal: Risk for activity intolerance will decrease Outcome: Progressing   Problem: Nutrition: Goal: Adequate nutrition will be maintained Outcome: Progressing   Problem: Elimination: Goal: Will not experience complications related to bowel motility Outcome: [place], RN 01/03/23 7:35 PM

## 2023-01-03 NOTE — Progress Notes (Signed)
The patient tested positive for Covid on 2/29. He is having a cough and congestion. Messaged Raenette Rover to request order set.

## 2023-01-03 NOTE — Progress Notes (Signed)
ID PROGRESS NOTE  57yo M initially admitted for SBO started to have respiratory symptoms with fever, close contact +, who tested positive on 01/02/2023.   Agree with plan to start paxlovid given < 5 days of symptoms and that he has risk factors for severe disease. Continue with supportive care.   Carlos [name] for Infectious Diseases [phone]

## 2023-01-03 NOTE — Progress Notes (Signed)
Mobility Specialist - Progress Note   01/03/23 0940  Mobility  Activity  (Bed Level Exercises)  Activity Response Tolerated well  Mobility Referral Yes  $Mobility charge 1 Mobility   Pt received in bed and agreeable to do bed level exercises. No complaints during session. Pt left in bed w/ necessities in reach.    Supine BUE Exercises: 5 reps each,  yellow resistance band  1) Elbow Flexion  3) Horizontal Abduction      Set designer

## 2023-01-04 DIAGNOSIS — M24529 Contracture, unspecified elbow: Secondary | ICD-10-CM | POA: Insufficient documentation

## 2023-01-04 DIAGNOSIS — U071 COVID-19: Secondary | ICD-10-CM | POA: Insufficient documentation

## 2023-01-04 DIAGNOSIS — M24549 Contracture, unspecified hand: Secondary | ICD-10-CM | POA: Insufficient documentation

## 2023-01-04 DIAGNOSIS — C720 Malignant neoplasm of spinal cord: Secondary | ICD-10-CM | POA: Diagnosis not present

## 2023-01-04 DIAGNOSIS — K5669 Other partial intestinal obstruction: Secondary | ICD-10-CM | POA: Diagnosis not present

## 2023-01-04 DIAGNOSIS — Z7401 Bed confinement status: Secondary | ICD-10-CM

## 2023-01-04 DIAGNOSIS — K56609 Unspecified intestinal obstruction, unspecified as to partial versus complete obstruction: Secondary | ICD-10-CM | POA: Diagnosis not present

## 2023-01-04 MED ORDER — METOCLOPRAMIDE HCL 5 MG PO TABS: 5.0000 mg | ORAL_TABLET | Freq: Three times a day (TID) | ORAL | Status: DC | PRN

## 2023-01-04 MED ORDER — SENNA 8.6 MG PO TABS
1.0000 | ORAL_TABLET | Freq: Every day | ORAL | Status: DC
  Administered 2023-01-04 – 2023-01-09 (×6): 8.6 mg via ORAL
  Filled 2023-01-04 (×6): qty 1

## 2023-01-04 MED ORDER — POTASSIUM CHLORIDE CRYS ER 20 MEQ PO TBCR
40.0000 meq | EXTENDED_RELEASE_TABLET | Freq: Once | ORAL | Status: AC
  Administered 2023-01-04: 40 meq via ORAL
  Filled 2023-01-04: qty 2

## 2023-01-04 MED ORDER — POLYETHYLENE GLYCOL 3350 17 G PO PACK: 17.0000 g | PACK | Freq: Every day | ORAL | Status: DC | PRN

## 2023-01-04 NOTE — Progress Notes (Signed)
Carlos [name] [id number] 1966-07-15  CARE TEAM:  PCP: Nicholes Rough, PA-C  Outpatient Care Team: Patient Care Team: Nicholes Rough, PA-C as PCP - General (Physician Assistant) Eusebio Friendly, MD as Referring Physician (Gastroenterology) Tommas Olp, MD as Referring Physician (Neurology) Christy Sartorius, MD as Referring Physician (Urology)  Inpatient Treatment Team: Treatment Team: Attending Provider: [name], MD; Rounding Team: Redmond Baseman, MD; Consulting Physician: Edison Pace, Md, MD; Mobility Specialist: Trevor Mace; Registered Nurse: Nila Nephew, RN; Utilization Review: Claudie Leach, RN; Pharmacist: Luiz Ochoa, [facility]   Problem List:   Principal Problem:   SBO (small bowel obstruction) [facility]) Active Problems:   Leukocytosis   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  [facility] Stay = 6 days)  Assessment/Plan Probable ileus > SBO - no prior h/o abdominal surgery - CT scan on admission reports transition point in the jejunum concerning for a low-grade or functional small-bowel obstruction; the distal small bowel is decompressed. CT also shows a large amount of stool in the colon, as well as a distended and fluid-filled stomach. - CT repeated 2/27 due to hiccups and constant upper abdominal/esophageal spasms- SBO resolved, Suspect an underlying global motility issue. Recommend GI follow up for further workup. -Trying some clears.  Will try dysphagia 1 diet. -Suspect he has some gastric dysmotility with bloating.  Would be at risk for delayed gastric emptying.  See if GI agrees.  Can offer metoclopramide as needed p.o. to see if that would help just in case. -Try to restart oral promotility regimen.  Continue suppositories for now. - Continue aggressive bowel regimen,  add on oral regimen once tolerating PO intake.     History of spinal cord ependymoma  Bedbound Chronic constipation Hx psoriasis Concern for possible COVID exposure.  Tested +2/29.  Respiratory precautions.  ID aware.      I reviewed nursing notes, Consultant ID notes, hospitalist notes, last 24 h vitals and pain scores, last 48 h intake and output, last 24 h labs and trends, and last 24 h imaging results. I have reviewed this patient's available data, including medical history, events of note, test results, etc as part of my evaluation.  A significant portion of that time was spent in counseling.  Care during the described time interval was provided by me.  This care required moderate level of medical decision making.  01/04/2023    Subjective: (Chief complaint)  Nursing in room giving suppository.  Patient denies much abdominal pain.  Tolerating some clears but still having some bloating and heartburn.  Not to the point of nausea or emesis.  Objective:  Vital signs:  Vitals:   01/03/23 0620 01/03/23 1328 01/03/23 2253 01/04/23 0702  BP: 124/83 125/74 129/88 139/84  Pulse: 99 96 97 (!) 108  Resp: '17 18 16 16  '$ Temp: 98.3 F (36.8 C) 98.2 F (36.8 C) 98.2 F (36.8 C) 98.1 F (36.7 C)  TempSrc: Oral Oral Oral Oral  SpO2: 97% 96% 96% 95%  Weight:      Height:        Last BM Date : 01/03/23  Intake/Output   Yesterday:  03/01 0701 - 03/02 0700 In: 2603.9 [P.O.:290; I.V.:2313.9] Out: 650 [Urine:650] This shift:  No intake/output data recorded.  Bowel function:  Flatus: YES  BM:  YES  Drain: (No drain)   Physical Exam:  General: Pt awake/alert in no acute distress Eyes: PERRL, normal EOM.  Sclera clear.  No icterus Neuro: CN II-XII intact w/o focal sensory/motor deficits. Lymph: No head/neck/groin lymphadenopathy Psych:  No delerium/psychosis/paranoia.  Oriented x 4 HENT: Normocephalic, Mucus membranes moist.  No thrush Neck: Supple, No tracheal  deviation.  No obvious thyromegaly Chest: No pain to chest wall compression.  Good respiratory excursion.  No audible wheezing CV:  Pulses intact.  Regular rhythm.  No major extremity edema MS: Chronic bilateral hand extensive contractures.  Some limited mobility in upper extremity as well.   Abdomen: Soft.  Moderately distended.  Nontender.  No evidence of peritonitis.  No incarcerated hernias.  Ext: As noted in musculoskeletal with chronic upper extent.  He hand greater than wrist and upper extremity contractures.  No mjr edema.  No cyanosis Skin: No petechiae / purpurea.  No major sores.  Warm and dry    Results:   Cultures: Recent Results (from the past 720 hour(s))  Blood culture (routine x 2)     Status: Abnormal   Collection Time: 12/29/22  3:22 AM   Specimen: BLOOD RIGHT HAND  Result Value Ref Range Status   Specimen Description   Final    BLOOD RIGHT HAND BOTTLES DRAWN AEROBIC AND ANAEROBIC Performed at [facility], [place] [address], [place], [place] [address]    Special Requests   Final    Blood Culture adequate volume Performed at [place] [address], [place], [place] [address]    Culture  Setup Time   Final    GRAM POSITIVE COCCI IN CLUSTERS IN BOTH AEROBIC AND ANAEROBIC BOTTLES CRITICAL RESULT CALLED TO, READ BACK BY AND VERIFIED WITH: [place] 12/30/22 @ 0009 BY AB    Culture (A)  Final    STAPHYLOCOCCUS COHNII STAPHYLOCOCCUS EPIDERMIDIS THE SIGNIFICANCE OF ISOLATING THIS ORGANISM FROM A SINGLE SET OF BLOOD CULTURES WHEN MULTIPLE SETS ARE DRAWN IS UNCERTAIN. PLEASE NOTIFY THE MICROBIOLOGY DEPARTMENT WITHIN ONE WEEK IF SPECIATION AND SENSITIVITIES ARE REQUIRED. Performed at [facility], [place] [address], [place], [place] [id number]    Report Status 01/01/2023 FINAL  Final  Blood Culture ID Panel (Reflexed)     Status: Abnormal   Collection Time: 12/29/22  3:22 AM  Result Value Ref Range Status    Enterococcus faecalis NOT DETECTED NOT DETECTED Final   Enterococcus Faecium NOT DETECTED NOT DETECTED Final   Listeria monocytogenes NOT DETECTED NOT DETECTED Final   Staphylococcus species DETECTED (A) NOT DETECTED Final    Comment: CRITICAL RESULT CALLED TO, READ BACK BY AND VERIFIED WITH: [place] 12/30/22 @ 0009 BY AB    Staphylococcus aureus (BCID) NOT DETECTED NOT DETECTED Final   Staphylococcus epidermidis DETECTED (A) NOT DETECTED Final    Comment: CRITICAL RESULT CALLED TO, READ BACK BY AND VERIFIED WITH: PHARMD M. [place] 12/30/22 @ 0009 BY AB    Staphylococcus lugdunensis NOT DETECTED NOT DETECTED Final   Streptococcus species NOT DETECTED NOT DETECTED Final   Streptococcus agalactiae NOT DETECTED NOT DETECTED Final   Streptococcus pneumoniae NOT DETECTED NOT DETECTED Final   Streptococcus pyogenes NOT DETECTED NOT DETECTED Final   A.calcoaceticus-baumannii NOT DETECTED NOT DETECTED Final   Bacteroides fragilis NOT DETECTED NOT DETECTED Final   Enterobacterales NOT DETECTED NOT DETECTED Final   Enterobacter cloacae complex NOT DETECTED NOT DETECTED Final  Escherichia coli NOT DETECTED NOT DETECTED Final   Klebsiella aerogenes NOT DETECTED NOT DETECTED Final   Klebsiella oxytoca NOT DETECTED NOT DETECTED Final   Klebsiella pneumoniae NOT DETECTED NOT DETECTED Final   Proteus species NOT DETECTED NOT DETECTED Final   Salmonella species NOT DETECTED NOT DETECTED Final   Serratia marcescens NOT DETECTED NOT DETECTED Final   Haemophilus influenzae NOT DETECTED NOT DETECTED Final   Neisseria meningitidis NOT DETECTED NOT DETECTED Final   Pseudomonas aeruginosa NOT DETECTED NOT DETECTED Final   Stenotrophomonas maltophilia NOT DETECTED NOT DETECTED Final   Candida albicans NOT DETECTED NOT DETECTED Final   Candida auris NOT DETECTED NOT DETECTED Final   Candida glabrata NOT DETECTED NOT DETECTED Final   Candida krusei NOT DETECTED NOT DETECTED Final   Candida  parapsilosis NOT DETECTED NOT DETECTED Final   Candida tropicalis NOT DETECTED NOT DETECTED Final   Cryptococcus neoformans/gattii NOT DETECTED NOT DETECTED Final   Methicillin resistance mecA/C NOT DETECTED NOT DETECTED Final    Comment: Performed at [facility], [place] [address] [place], [place] [address]  Blood culture (routine x 2)     Status: None   Collection Time: 12/29/22  3:33 AM   Specimen: Right Antecubital; Blood  Result Value Ref Range Status   Specimen Description RIGHT ANTECUBITAL BLOOD  Final   Special Requests   Final    Blood Culture results may not be optimal due to an inadequate volume of blood received in culture bottles BOTTLES DRAWN AEROBIC AND ANAEROBIC   Culture   Final    NO GROWTH 5 DAYS Performed at [facility], [place] [address], [place], [place] [address]    Report Status 01/03/2023 FINAL  Final  Culture, blood (Routine X 2) w Reflex to ID Panel     Status: Abnormal   Collection Time: 12/31/22  5:43 PM   Specimen: BLOOD RIGHT ARM  Result Value Ref Range Status   Specimen Description   Final    BLOOD RIGHT ARM Performed at [facility], [place] [address] [place], [place] [address]    Special Requests   Final    BOTTLES DRAWN AEROBIC ONLY Blood Culture adequate volume Performed at [place] [address] [place], [place] [address]    Culture  Setup Time   Final    GRAM POSITIVE COCCI IN CLUSTERS AEROBIC BOTTLE ONLY CRITICAL RESULT CALLED TO, READ BACK BY AND VERIFIED WITH: PHARMD ABBIE E. [id number] FCP    Culture (A)  Final    MICROCOCCUS LUTEUS/LYLAE Standardized susceptibility testing for this organism is not available. Performed at [facility], [place] [address], [place], [place] [address]    Report Status 01/03/2023 FINAL  Final  Culture, blood (Routine X 2) w Reflex to ID Panel     Status: None (Preliminary result)   Collection Time: 12/31/22  5:43 PM   Specimen: BLOOD  Result Value Ref Range  Status   Specimen Description   Final    BLOOD BLOOD RIGHT HAND AEROBIC BOTTLE ONLY Performed at [place] [address] [place], [place] [address]    Special Requests   Final    BOTTLES DRAWN AEROBIC ONLY Blood Culture adequate volume Performed at [place] [address], [place], [place] [address]    Culture   Final    NO GROWTH 4 DAYS Performed at [facility], [place] [address] [place],  [address]  Report Status PENDING  Incomplete  Resp panel by RT-PCR (RSV, Flu A&B, Covid) Anterior Nasal Swab     Status: Abnormal   Collection Time: 01/02/23  4:02 PM   Specimen: Anterior Nasal Swab  Result Value Ref Range Status   SARS Coronavirus 2 by RT PCR POSITIVE (A) NEGATIVE Final    Comment: (NOTE) SARS-CoV-2 target nucleic acids are DETECTED.  The SARS-CoV-2 RNA is generally detectable in upper respiratory specimens during the acute phase of infection. Positive results are indicative of the presence of the identified virus, but do not rule out bacterial infection or co-infection with other pathogens not detected by the test. Clinical correlation with patient history and other diagnostic information is necessary to determine patient infection status. The expected result is Negative.  Fact Sheet for Patients: EntrepreneurPulse.com.au  Fact Sheet for Healthcare Providers: IncredibleEmployment.be  This test is not yet approved or cleared by the [place] FDA and  has been authorized for detection and/or diagnosis of SARS-CoV-2 by FDA under an Emergency Use Authorization (EUA).  This EUA will remain in effect (meaning this test can be used) for the duration of  the COVID-19 declaration under Section 564(b)(1) of the A ct, 21 U.S.C. section 360bbb-3(b)(1), unless the authorization is terminated or revoked sooner.     Influenza A by PCR NEGATIVE NEGATIVE Final   Influenza B by PCR  NEGATIVE NEGATIVE Final    Comment: (NOTE) The Xpert Xpress SARS-CoV-2/FLU/RSV plus assay is intended as an aid in the diagnosis of influenza from Nasopharyngeal swab specimens and should not be used as a sole basis for treatment. Nasal washings and aspirates are unacceptable for Xpert Xpress SARS-CoV-2/FLU/RSV testing.  Fact Sheet for Patients: EntrepreneurPulse.com.au  Fact Sheet for Healthcare Providers: IncredibleEmployment.be  This test is not yet approved or cleared by the [place] FDA and has been authorized for detection and/or diagnosis of SARS-CoV-2 by FDA under an Emergency Use Authorization (EUA). This EUA will remain in effect (meaning this test can be used) for the duration of the COVID-19 declaration under Section 564(b)(1) of the Act, 21 U.S.C. section 360bbb-3(b)(1), unless the authorization is terminated or revoked.     Resp Syncytial Virus by PCR NEGATIVE NEGATIVE Final    Comment: (NOTE) Fact Sheet for Patients: EntrepreneurPulse.com.au  Fact Sheet for Healthcare Providers: IncredibleEmployment.be  This test is not yet approved or cleared by the [place] FDA and has been authorized for detection and/or diagnosis of SARS-CoV-2 by FDA under an Emergency Use Authorization (EUA). This EUA will remain in effect (meaning this test can be used) for the duration of the COVID-19 declaration under Section 564(b)(1) of the Act, 21 U.S.C. section 360bbb-3(b)(1), unless the authorization is terminated or revoked.  Performed at [facility], [place] [address] [place], [place] [address]   Respiratory (~20 pathogens) panel by PCR     Status: None   Collection Time: 01/02/23  4:02 PM   Specimen: Nasopharyngeal Swab; Respiratory  Result Value Ref Range Status   Adenovirus NOT DETECTED NOT DETECTED Final   Coronavirus 229E NOT DETECTED NOT DETECTED Final    Comment:  (NOTE) The Coronavirus on the Respiratory Panel, DOES NOT test for the novel  Coronavirus (2019 nCoV)    Coronavirus HKU1 NOT DETECTED NOT DETECTED Final   Coronavirus NL63 NOT DETECTED NOT DETECTED Final   Coronavirus OC43 NOT DETECTED NOT DETECTED Final   Metapneumovirus NOT DETECTED NOT DETECTED Final   Rhinovirus / Enterovirus NOT DETECTED NOT DETECTED Final   Influenza  A NOT DETECTED NOT DETECTED Final   Influenza B NOT DETECTED NOT DETECTED Final   Parainfluenza Virus 1 NOT DETECTED NOT DETECTED Final   Parainfluenza Virus 2 NOT DETECTED NOT DETECTED Final   Parainfluenza Virus 3 NOT DETECTED NOT DETECTED Final   Parainfluenza Virus 4 NOT DETECTED NOT DETECTED Final   Respiratory Syncytial Virus NOT DETECTED NOT DETECTED Final   Bordetella pertussis NOT DETECTED NOT DETECTED Final   Bordetella Parapertussis NOT DETECTED NOT DETECTED Final   Chlamydophila pneumoniae NOT DETECTED NOT DETECTED Final   Mycoplasma pneumoniae NOT DETECTED NOT DETECTED Final    Comment: Performed at [facility], [place] [address], [place], [place] [address]    Labs: Results for orders placed or performed during the hospital encounter of 12/28/22 (from the past 48 hour(s))  Resp panel by RT-PCR (RSV, Flu A&B, Covid) Anterior Nasal Swab     Status: Abnormal   Collection Time: 01/02/23  4:02 PM   Specimen: Anterior Nasal Swab  Result Value Ref Range   SARS Coronavirus 2 by RT PCR POSITIVE (A) NEGATIVE    Comment: (NOTE) SARS-CoV-2 target nucleic acids are DETECTED.  The SARS-CoV-2 RNA is generally detectable in upper respiratory specimens during the acute phase of infection. Positive results are indicative of the presence of the identified virus, but do not rule out bacterial infection or co-infection with other pathogens not detected by the test. Clinical correlation with patient history and other diagnostic information is necessary to determine patient infection status. The expected result is  Negative.  Fact Sheet for Patients: EntrepreneurPulse.com.au  Fact Sheet for Healthcare Providers: IncredibleEmployment.be  This test is not yet approved or cleared by the [place] FDA and  has been authorized for detection and/or diagnosis of SARS-CoV-2 by FDA under an Emergency Use Authorization (EUA).  This EUA will remain in effect (meaning this test can be used) for the duration of  the COVID-19 declaration under Section 564(b)(1) of the A ct, 21 U.S.C. section 360bbb-3(b)(1), unless the authorization is terminated or revoked sooner.     Influenza A by PCR NEGATIVE NEGATIVE   Influenza B by PCR NEGATIVE NEGATIVE    Comment: (NOTE) The Xpert Xpress SARS-CoV-2/FLU/RSV plus assay is intended as an aid in the diagnosis of influenza from Nasopharyngeal swab specimens and should not be used as a sole basis for treatment. Nasal washings and aspirates are unacceptable for Xpert Xpress SARS-CoV-2/FLU/RSV testing.  Fact Sheet for Patients: EntrepreneurPulse.com.au  Fact Sheet for Healthcare Providers: IncredibleEmployment.be  This test is not yet approved or cleared by the [place] FDA and has been authorized for detection and/or diagnosis of SARS-CoV-2 by FDA under an Emergency Use Authorization (EUA). This EUA will remain in effect (meaning this test can be used) for the duration of the COVID-19 declaration under Section 564(b)(1) of the Act, 21 U.S.C. section 360bbb-3(b)(1), unless the authorization is terminated or revoked.     Resp Syncytial Virus by PCR NEGATIVE NEGATIVE    Comment: (NOTE) Fact Sheet for Patients: EntrepreneurPulse.com.au  Fact Sheet for Healthcare Providers: IncredibleEmployment.be  This test is not yet approved or cleared by the [place] FDA and has been authorized for detection and/or diagnosis of SARS-CoV-2 by FDA under an  Emergency Use Authorization (EUA). This EUA will remain in effect (meaning this test can be used) for the duration of the COVID-19 declaration under Section 564(b)(1) of the Act, 21 U.S.C. section 360bbb-3(b)(1), unless the authorization is terminated or revoked.  Performed at [facility], [place] [address] [place], [place] [address]   Respiratory (~20 pathogens) panel by PCR     Status: None   Collection Time: 01/02/23  4:02 PM   Specimen: Nasopharyngeal Swab; Respiratory  Result Value Ref Range   Adenovirus NOT DETECTED NOT DETECTED   Coronavirus 229E NOT DETECTED NOT DETECTED    Comment: (NOTE) The Coronavirus on the Respiratory Panel, DOES NOT test for the novel  Coronavirus (2019 nCoV)    Coronavirus HKU1 NOT DETECTED NOT DETECTED   Coronavirus NL63 NOT DETECTED NOT DETECTED   Coronavirus OC43 NOT DETECTED NOT DETECTED   Metapneumovirus NOT DETECTED NOT DETECTED   Rhinovirus / Enterovirus NOT DETECTED NOT DETECTED   Influenza A NOT DETECTED NOT DETECTED   Influenza B NOT DETECTED NOT DETECTED   Parainfluenza Virus 1 NOT DETECTED NOT DETECTED   Parainfluenza Virus 2 NOT DETECTED NOT DETECTED   Parainfluenza Virus 3 NOT DETECTED NOT DETECTED   Parainfluenza Virus 4 NOT DETECTED NOT DETECTED   Respiratory Syncytial Virus NOT DETECTED NOT DETECTED   Bordetella pertussis NOT DETECTED NOT DETECTED   Bordetella Parapertussis NOT DETECTED NOT DETECTED   Chlamydophila pneumoniae NOT DETECTED NOT DETECTED   Mycoplasma pneumoniae NOT DETECTED NOT DETECTED    Comment: Performed at [facility], [place] [address] [place], [place] [id number]  Basic metabolic panel     Status: Abnormal   Collection Time: 01/03/23  4:21 AM  Result Value Ref Range   Sodium 138 135 - 145 mmol/L   Potassium 3.0 (L) 3.5 - 5.1 mmol/L   Chloride 109 98 - 111 mmol/L   CO2 17 (L) 22 - 32 mmol/L   Glucose, Bld 87 70 - 99 mg/dL    Comment: Glucose reference range applies only to samples  taken after fasting for at least 8 hours.   BUN 8 6 - 20 mg/dL   Creatinine, Ser 0.58 (L) 0.61 - 1.24 mg/dL   Calcium 7.7 (L) 8.9 - 10.3 mg/dL   GFR, Estimated >60 >60 mL/min    Comment: (NOTE) Calculated using the CKD-EPI Creatinine Equation (2021)    Anion gap 12 5 - 15    Comment: Performed at [facility], [place] [address], [place], [place] [address]  CBC     Status: Abnormal   Collection Time: 01/03/23  4:21 AM  Result Value Ref Range   WBC 12.1 (H) 4.0 - 10.5 K/uL   RBC 4.23 4.22 - 5.81 MIL/uL   Hemoglobin 11.4 (L) 13.0 - 17.0 g/dL   HCT 36.7 (L) 39.0 - 52.0 %   MCV 86.8 80.0 - 100.0 fL   MCH 27.0 26.0 - 34.0 pg   MCHC 31.1 30.0 - 36.0 g/dL   RDW 14.1 11.5 - 15.5 %   Platelets 224 150 - 400 K/uL   nRBC 0.0 0.0 - 0.2 %    Comment: Performed at [facility], [place] [address], [place], [place] [address]    Imaging / Studies: No results found.  Medications / Allergies: per chart  Antibiotics: Anti-infectives (From admission, onward)    Start     Dose/Rate Route Frequency Ordered Stop   01/03/23 0600  nirmatrelvir/ritonavir (PAXLOVID) 3 tablet        3 tablet Oral 2 times daily 01/03/23 0304 01/08/23 0959   12/31/22 1600  vancomycin (VANCOREADY) IVPB 1750 mg/350 mL  Status:  Discontinued        1,750 mg 175 mL/hr over 120 Minutes Intravenous Every 12 hours  12/31/22 1503 01/02/23 1733   12/31/22 1600  piperacillin-tazobactam (ZOSYN) IVPB 3.375 g  Status:  Discontinued        3.375 g 12.5 mL/hr over 240 Minutes Intravenous Every 8 hours 12/31/22 1503 01/03/23 0601         Note: Portions of this report may have been transcribed using voice recognition software. Every effort was made to ensure accuracy; however, inadvertent computerized transcription errors may be present.   Any transcriptional errors that result from this process are unintentional.    Adin Hector, MD, FACS, MASCRS Esophageal, Gastrointestinal & Colorectal  Surgery Robotic and Minimally Invasive Surgery  [place]. [address], [place], [place] [address] [phone] Fax [phone] Main  CONTACT INFORMATION:  Weekday (9AM-5PM): Call [facility] main office at [phone]  Weeknight (5PM-9AM) or Weekend/Holiday: Check [email or web address] (password " [email or web address]") for General Surgery [facility] coverage  (Please, do not use SecureChat as it is not reliable communication to reach operating surgeons for immediate patient care given surgeries/outpatient duties/clinic/cross-coverage/off post-call which would lead to a delay in care.  Epic staff messaging available for outptient concerns, but may not be answered for 48 hours or more).     01/04/2023  12:24 PM

## 2023-01-04 NOTE — Progress Notes (Signed)
[facility] PROGRESS NOTE  Carlos [name] (DOB: 1965-11-30) [id number] PCP: Nicholes Rough, PA-C  Brief Narrative: Carlos [name] is a 56 year old male with history of spinal ependymoma status post surgery, psoriasis,bed bound  who presented with 5-day history of abdominal pain, nausea, no bowel movement no history of abdominal surgery in the past.  History of chronic constipation.  On presentation lab work showed sodium 132, potassium 3.4, WBC of 17.3.  CT scan showed functional low-grade SBO with large amount of stool in the colon.  Started on conservative management after admitted for the management of low-grade SBO. NG tube placed.  General surgery consulted and following. GI also consulted for concern of motility disorder. Patient has had return of bowel function, NG tube out 3/1. Developed fever with positive sick contact and viral panel positive for covid-19 infection 2/29.  Subjective: Significant nausea and 2 episodes of nearly vomiting, BM last night. No fever or other complaints. Wants suppository.   Objective: BP 131/82 (BP Location: Right Arm)   Pulse 97   Temp 98 F (36.7 C) (Oral)   Resp 18   Ht '6\' 4"'$  (1.93 m)   Wt 90.7 kg   SpO2 94%   BMI 24.34 kg/m   Gen: No distress Pulm: Clear, nonlabored  CV: RRR GI: Soft, NT, ND, +BS Neuro: Alert and oriented. No new focal deficits. Ext: Warm, dry. Skin: No rashes, lesions or ulcers on visualized skin   Assessment & Plan: SBO: Idiopathic. Has had BMs and declining NG output (initially 2,450 > 1,110 > 950cc/24hrs). Suspect bedbound status/immobility, chronic constipation, and possible underlying motility disorder contributing.  - GI consulted, recommends gastroscintigraphy as an outpatient. Give reglan prn severe nausea.  - Initiate miralax, metamucil, and dulcolax daily   - Having significant nausea but no actual vomiting since NG removal. Advance diet as tolerated - continue IV therapies while trial of po.  Covid-19  infection:  - Paxlovid initiated - Airborne/contact precautions - Supportive care including afrin nasal spray and incentive spirometry  Contaminated blood cultures: Had S. epidermidis and S. cohnii on 1 of 2 blood cultures at admission, Micrococcus luteus on 1 of 4 when repeated 2/27, still suspected contaminant.   - Monitoring off abx for now.   Insomnia:  - Melatonin qHS   Hypokalemia:  - Supplement po and in IVF, monitor  History of spinal cord ependymoma s/p laminectomy with chronic contractures, limited mobility: At baseline. - HH to be arranged at discharge.   Psoriasis:  - Dermatology follow up recommended.   Carlos Pour, MD [facility] [email or web address] 01/04/2023, 4:05 PM

## 2023-01-05 DIAGNOSIS — K56609 Unspecified intestinal obstruction, unspecified as to partial versus complete obstruction: Secondary | ICD-10-CM | POA: Diagnosis not present

## 2023-01-05 DIAGNOSIS — K5669 Other partial intestinal obstruction: Secondary | ICD-10-CM | POA: Diagnosis not present

## 2023-01-05 DIAGNOSIS — C720 Malignant neoplasm of spinal cord: Secondary | ICD-10-CM | POA: Diagnosis not present

## 2023-01-05 LAB — BASIC METABOLIC PANEL
Anion gap: 4 — ABNORMAL LOW (ref 5–15)
BUN: <5 mg/dL — ABNORMAL LOW (ref 6–20)
CO2: 24 mmol/L (ref 22–32)
Calcium: 7.4 mg/dL — ABNORMAL LOW (ref 8.9–10.3)
Chloride: 106 mmol/L (ref 98–111)
Creatinine, Ser: 0.4 mg/dL — ABNORMAL LOW (ref 0.61–1.24)
GFR, Estimated: >60 mL/min (ref >60)
Glucose, Bld: 124 mg/dL — ABNORMAL HIGH (ref 70–99)
Potassium: 3.7 mmol/L (ref 3.5–5.1)
Sodium: 134 mmol/L — ABNORMAL LOW (ref 135–145)

## 2023-01-05 LAB — CBC
HCT: 37.6 % — ABNORMAL LOW (ref 39.0–52.0)
Hemoglobin: 11.8 g/dL — ABNORMAL LOW (ref 13.0–17.0)
MCH: 26.9 pg (ref 26.0–34.0)
MCHC: 31.4 g/dL (ref 30.0–36.0)
MCV: 85.8 fL (ref 80.0–100.0)
Platelets: 321 10*3/uL (ref 150–400)
RBC: 4.38 MIL/uL (ref 4.22–5.81)
RDW: 13.6 % (ref 11.5–15.5)
WBC: 14 10*3/uL — ABNORMAL HIGH (ref 4.0–10.5)
nRBC: 0 % (ref 0.0–0.2)

## 2023-01-05 MED ORDER — KCL-LACTATED RINGERS-D5W 20 MEQ/L IV SOLN
INTRAVENOUS | Status: DC
  Filled 2023-01-05 (×10): qty 1000

## 2023-01-05 NOTE — Plan of Care (Signed)

## 2023-01-05 NOTE — Progress Notes (Signed)
[facility] PROGRESS NOTE  Carlos [name] (DOB: 14-Mar-1966) [id number] PCP: Nicholes Rough, PA-C  Brief Narrative: Carlos [name] is a 57 year old male with history of spinal ependymoma status post surgery, psoriasis,bed bound  who presented with 5-day history of abdominal pain, nausea, no bowel movement no history of abdominal surgery in the past.  History of chronic constipation.  On presentation lab work showed sodium 132, potassium 3.4, WBC of 17.3.  CT scan showed functional low-grade SBO with large amount of stool in the colon.  Started on conservative management after admitted for the management of low-grade SBO. NG tube placed.  General surgery consulted and following. GI also consulted for concern of motility disorder. Patient has had return of bowel function, NG tube out 3/1. Developed fever with positive sick contact and viral panel positive for covid-19 infection 2/29.  Subjective: Had another episode of nausea and feeling of reflux that did not actually develop into vomiting this morning. Some generalized abd cramping at that time but none otherwise at all. Having BMs with suppository. Congestion improved.   Objective: BP 111/71 (BP Location: Left Arm)   Pulse 96   Temp 98.2 F (36.8 C) (Oral)   Resp 18   Ht '6\' 4"'$  (1.93 m)   Wt 90.7 kg   SpO2 94%   BMI 24.34 kg/m   Gen: No distress Pulm: Clear, nonlabored  CV: RRR, no MRG or edema GI: Soft, protuberant without tenderness, +BS. Neuro: Alert and oriented. No new focal deficits. Ext: Warm, stable contractures Skin: No new rashes, lesions or ulcers on visualized skin   Assessment & Plan: SBO: Idiopathic. Has had BMs and declining NG output (initially 2,450 > 1,110 > 950cc/24hrs). Suspect bedbound status/immobility, chronic constipation, and possible underlying motility disorder contributing.  - GI consulted, recommends gastroscintigraphy as an outpatient. Give reglan prn severe nausea.  - Continue miralax, metamucil, and  dulcolax suppository daily   - Having significant nausea but no actual vomiting since NG removal. Advance to soft per surgery. - Continue IV therapies while trial of po.  Covid-19 infection:  - Continue paxlovid 3/1 - 3/5) - Airborne/contact precautions per protocol - Supportive care including afrin nasal spray and incentive spirometry  Contaminated blood cultures: Had S. epidermidis and S. cohnii on 1 of 2 blood cultures at admission, Micrococcus luteus on 1 of 4 when repeated 2/27, still suspected contaminant per ID. - Monitoring off abx for now. Still with nonspecific leukocytosis, check in AM w/diff.  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 - HH to be arranged at discharge.   Psoriasis:  - Dermatology follow up recommended.   [name], MD [facility] [email or web address] 01/05/2023, 12:54 PM

## 2023-01-05 NOTE — Progress Notes (Signed)
   Subjective/Chief Complaint: Denies abdominal pain Had a BM yesterday   Objective: Vital signs in last 24 hours: Temp:  [98 F (36.7 C)-98.3 F (36.8 C)] 98.2 F (36.8 C) (03/03 0637) Pulse Rate:  [96-98] 96 (03/03 0637) Resp:  [17-18] 18 (03/03 0637) BP: (111-131)/(71-82) 111/71 (03/03 0637) SpO2:  [94 %-96 %] 94 % (03/03 0637) Last BM Date : 01/04/23  Intake/Output from previous day: 03/02 0701 - 03/03 0700 In: 1305.2 [P.O.:180; I.V.:1125.2] Out: 200 [Urine:200] Intake/Output this shift: No intake/output data recorded.  Abd - soft, non-tender  Lab Results:  Recent Labs    01/03/23 0421  WBC 12.1*  HGB 11.4*  HCT 36.7*  PLT 224   BMET Recent Labs    01/03/23 0421  NA 138  K 3.0*  CL 109  CO2 17*  GLUCOSE 87  BUN 8  CREATININE 0.58*  CALCIUM 7.7*   PT/INR No results for input(s): "LABPROT", "INR" in the last 72 hours. ABG No results for input(s): "PHART", "HCO3" in the last 72 hours.  Invalid input(s): "PCO2", "PO2"  Studies/Results: No results found.  Anti-infectives: Anti-infectives (From admission, onward)    Start     Dose/Rate Route Frequency Ordered Stop   01/03/23 0600  nirmatrelvir/ritonavir (PAXLOVID) 3 tablet        3 tablet Oral 2 times daily 01/03/23 0304 01/08/23 0959   12/31/22 1600  vancomycin (VANCOREADY) IVPB 1750 mg/350 mL  Status:  Discontinued        1,750 mg 175 mL/hr over 120 Minutes Intravenous Every 12 hours 12/31/22 1503 01/02/23 1733   12/31/22 1600  piperacillin-tazobactam (ZOSYN) IVPB 3.375 g  Status:  Discontinued        3.375 g 12.5 mL/hr over 240 Minutes Intravenous Every 8 hours 12/31/22 1503 01/03/23 0601       Assessment/Plan: SBO - no prior h/o abdominal surgery - CT scan on admission reports transition point in the jejunum concerning for a low-grade or functional small-bowel obstruction; the distal small bowel is decompressed. CT also shows a large amount of stool in the colon, as well as a distended  and fluid-filled stomach. - CT repeated 2/27 due to hiccups and constant upper abdominal/esophageal spasms- SBO resolved, stomach remains distended with retained contrast and with high NG output. 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 Recommend GI follow up for further workup. - Soft diet - Continue aggressive bowel regimen, add on oral regimen once tolerating PO intake.    COVID positive History of spinal cord ependymoma  Bedbound Chronic constipation Hx psoriasis  LOS: 7 days    Carlos [name] 01/05/2023

## 2023-01-06 DIAGNOSIS — K56609 Unspecified intestinal obstruction, unspecified as to partial versus complete obstruction: Secondary | ICD-10-CM | POA: Diagnosis not present

## 2023-01-06 DIAGNOSIS — C720 Malignant neoplasm of spinal cord: Secondary | ICD-10-CM | POA: Diagnosis not present

## 2023-01-06 DIAGNOSIS — K5669 Other partial intestinal obstruction: Secondary | ICD-10-CM | POA: Diagnosis not present

## 2023-01-06 LAB — CBC WITH DIFFERENTIAL/PLATELET
Abs Immature Granulocytes: 1.29 10*3/uL — ABNORMAL HIGH (ref 0.00–0.07)
Basophils Absolute: 0.1 10*3/uL (ref 0.0–0.1)
Basophils Relative: 1 %
Eosinophils Absolute: 0.1 10*3/uL (ref 0.0–0.5)
Eosinophils Relative: 1 %
HCT: 38.4 % — ABNORMAL LOW (ref 39.0–52.0)
Hemoglobin: 11.9 g/dL — ABNORMAL LOW (ref 13.0–17.0)
Immature Granulocytes: 8 %
Lymphocytes Relative: 9 %
Lymphs Abs: 1.5 10*3/uL (ref 0.7–4.0)
MCH: 26.4 pg (ref 26.0–34.0)
MCHC: 31 g/dL (ref 30.0–36.0)
MCV: 85.1 fL (ref 80.0–100.0)
Monocytes Absolute: 1.8 10*3/uL — ABNORMAL HIGH (ref 0.1–1.0)
Monocytes Relative: 11 %
Neutro Abs: 11.9 10*3/uL — ABNORMAL HIGH (ref 1.7–7.7)
Neutrophils Relative %: 70 %
Platelets: 343 10*3/uL (ref 150–400)
RBC: 4.51 MIL/uL (ref 4.22–5.81)
RDW: 13.5 % (ref 11.5–15.5)
WBC: 16.7 10*3/uL — ABNORMAL HIGH (ref 4.0–10.5)
nRBC: 0 % (ref 0.0–0.2)

## 2023-01-06 LAB — BASIC METABOLIC PANEL
Anion gap: 7 (ref 5–15)
BUN: <5 mg/dL — ABNORMAL LOW (ref 6–20)
CO2: 25 mmol/L (ref 22–32)
Calcium: 7.8 mg/dL — ABNORMAL LOW (ref 8.9–10.3)
Chloride: 104 mmol/L (ref 98–111)
Creatinine, Ser: 0.41 mg/dL — ABNORMAL LOW (ref 0.61–1.24)
GFR, Estimated: >60 mL/min (ref >60)
Glucose, Bld: 120 mg/dL — ABNORMAL HIGH (ref 70–99)
Potassium: 3.8 mmol/L (ref 3.5–5.1)
Sodium: 136 mmol/L (ref 135–145)

## 2023-01-06 LAB — CULTURE, BLOOD (ROUTINE X 2): Special Requests: ADEQUATE

## 2023-01-06 MED ORDER — METOCLOPRAMIDE HCL 5 MG PO TABS
10.0000 mg | ORAL_TABLET | Freq: Three times a day (TID) | ORAL | Status: DC
  Administered 2023-01-06 (×2): 10 mg via ORAL
  Filled 2023-01-06 (×2): qty 2

## 2023-01-06 NOTE — Plan of Care (Signed)
  Problem: Coping: Goal: Level of anxiety will decrease Outcome: Progressing   Problem: Pain Managment: Goal: General experience of comfort will improve Outcome: Progressing   Problem: Safety: Goal: Ability to remain free from injury will improve Outcome: Progressing   

## 2023-01-06 NOTE — TOC Progression Note (Signed)
Transition of Care [facility]) - Progression Note   Patient Details  Name: Carlos [name] MRN: [id number] Date of Birth: 1966-02-03  Transition of [facility]) CM/SW [place], LCSW Phone Number: 01/06/2023, 2:26 PM  Clinical Narrative: PT evaluation recommended a Harrel Lemon lift and transport chair. CSW made DME referral to [facility] with [employer]. [employer] to deliver DME to patient's home tomorrow. CSW confirmed with patient's mother that she can be contacted regarding delivery and [employer] was notified to coordinate delivery with mother. Mother asked about changing personal care services agency. CSW recommended that mother/patient follow up with Medicaid to determine which PCS agencies accept patient's Medicaid. TOC to follow.  Expected Discharge Plan: [place] Barriers to Discharge: Continued Medical Work up  Expected Discharge Plan and Services In-house Referral: Clinical Social Work Post Acute Care Choice: [place] arrangements for the past 2 months: [place]             DME Arranged: Other see comment Harrel Lemon lift, transport chair) DME Agency: [employer] Date DME Agency Contacted: 01/06/23 Representative spoke with at DME Agency: Erasmo Downer HH Arranged: PT [place]: [place] Date [place]: 12/30/22 Representative spoke with at [place]: [place] ([place]) Interventions SDOH Screenings   Tobacco Use: Low Risk  (12/29/2022)   Readmission Risk Interventions     No data to display

## 2023-01-06 NOTE — Progress Notes (Addendum)
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 Covid + 01/02/23.    PT Comments    Pt reports feeling weaker today, and has some abdominal discomfort, had nausea and vomiting earlier this morning. Noted new covid diagnosis 01/02/23. Attempted supine to long sit, pt unable to tolerate upright position 2* abdominal discomfort. Performed BLE strengthening exercises. BLEs externally rotated at rest, pt reports this is baseline. 2/4 dyspnea with activity, SpO2 95% on room air. Pt reports he has 24/7 assist at home. Due to decline in function, he will need a mechanical lift for home.     Recommendations for follow up therapy are one component of a multi-disciplinary discharge planning process, led by the attending physician.  Recommendations may be updated based on patient status, additional functional criteria and insurance authorization.  Follow Up Recommendations  Home health PT     Assistance Recommended at Discharge Frequent or constant Supervision/Assistance  Patient can return home with the following Two people to help with walking and/or transfers;A lot of help with bathing/dressing/bathroom;Assist for transportation;Help with stairs or ramp for entrance   Equipment Recommendations  Other (comment) (Hoyer lift, transport chair (his WC won't fit into the bathroom, needs narrower chair))    Recommendations for Other Services       Precautions / Restrictions Precautions Precautions: Fall;Other (comment) Precaution Comments: Now covid + Restrictions Weight Bearing Restrictions: No     Mobility  Bed Mobility Overal bed mobility: Needs Assistance Bed Mobility: Supine to Sit     Supine to sit: Mod assist     General bed  mobility comments: attempted supine to long sit in bed pulling up on R rail, pt unable to tolerate upright position 2* strain to abdominal area    Transfers                        Ambulation/Gait                   Stairs             Wheelchair Mobility    Modified Rankin (Stroke Patients Only)       Balance                                            Cognition Arousal/Alertness: Awake/alert Behavior During Therapy: WFL for tasks assessed/performed Overall Cognitive Status: Within Functional Limits for tasks assessed                                          Exercises General Exercises - Lower Extremity Ankle Circles/Pumps: Both, 15 reps, Supine, AAROM Short Arc Quad: AAROM, Both, 15 reps, Supine Heel Slides: AAROM, Both, 10 reps, Supine Hip ABduction/ADduction: AAROM, Both, 10 reps, Supine    General Comments        Pertinent Vitals/Pain Pain Assessment Pain Assessment: No/denies pain    Home Living                          Prior Function  PT Goals (current goals can now be found in the care plan section) Acute Rehab PT Goals PT Goal Formulation: With patient Time For Goal Achievement: 01/12/23 Potential to Achieve Goals: Fair Progress towards PT goals: Not progressing toward goals - comment (weaker today, recent covid dx)    Frequency    Min 2X/week      PT Plan Current plan remains appropriate    Co-evaluation              AM-PAC PT "6 Clicks" Mobility   Outcome Measure  Help needed turning from your back to your side while in a flat bed without using bedrails?: A Lot Help needed moving from lying on your back to sitting on the side of a flat bed without using bedrails?: Total Help needed moving to and from a bed to a chair (including a wheelchair)?: Total Help needed standing up from a chair using your arms (e.g., wheelchair or bedside chair)?: Total Help  needed to walk in hospital room?: Total Help needed climbing 3-5 steps with a railing? : Total 6 Click Score: 7    End of Session   Activity Tolerance: Patient limited by fatigue Patient left: in bed;with call bell/phone within reach Nurse Communication: Mobility status;Need for lift equipment PT Visit Diagnosis: Other symptoms and signs involving the nervous system (R29.898);Muscle weakness (generalized) (M62.81)     Time: [id number] PT Time Calculation (min) (ACUTE ONLY): 19 min  Charges:  $Therapeutic Exercise: 8-22 mins                     Blondell Reveal Kistler PT 01/06/2023  Acute Rehabilitation Services  Office [phone]

## 2023-01-06 NOTE — Progress Notes (Signed)
[facility] PROGRESS NOTE  Joaopedro Julich (DOB: Jan 22, 1966) [id number] PCP: Nicholes Rough, PA-C  Brief Narrative: Carlos [name] is a 57 year old male with history of spinal ependymoma status post surgery, psoriasis,bed bound  who presented with 5-day history of abdominal pain, nausea, no bowel movement no history of abdominal surgery in the past.  History of chronic constipation.  On presentation lab work showed sodium 132, potassium 3.4, WBC of 17.3.  CT scan showed functional low-grade SBO with large amount of stool in the colon.  Started on conservative management after admitted for the management of low-grade SBO. NG tube placed.  General surgery consulted and following. GI also consulted for concern of motility disorder. Patient has had return of bowel function, NG tube out 3/1. Developed fever with positive sick contact and viral panel positive for covid-19 infection 2/29.  Subjective: No fevers. Had brown vomit this morning and feels much better afterward. Has been drinking juice, eating crackers but that's it. No BM in past 24 hours. No abd pain.  Objective: BP 129/80 (BP Location: Left Arm)   Pulse 98   Temp 98.2 F (36.8 C) (Oral)   Resp 18   Ht '6\' 4"'$  (1.93 m)   Wt 90.7 kg   SpO2 94%   BMI 24.34 kg/m   Gen: No distress Pulm: Clear, nonlabored  CV: RRR, no MRG or pitting edema GI: Soft, protuberant with high pitched sparse bowel sounds, not very tender Neuro: Alert and oriented. No new focal deficits. Ext: Warm, well perfused, stable contractures Skin: No new rashes, lesions or ulcers on visualized skin   Assessment & Plan: SBO: Idiopathic. Has had BMs and declining NG output (initially 2,450 > 1,110 > 950cc/24hrs). Suspect bedbound status/immobility, chronic constipation, and possible underlying motility disorder contributing.  - GI consulted, recommends gastroscintigraphy as an outpatient. Give reglan prn severe nausea.  - Continue miralax, metamucil, and dulcolax  suppository daily   - Having nausea and vomiting on 3/3 and 3/4. No NGT reinsertion planned currently. Advanced diet per surgery. - Continue IV therapies while trial of po.  Covid-19 infection:  - Continue paxlovid (3/1 - 3/5) - Airborne/contact precautions per protocol - Supportive care and incentive spirometry  Contaminated blood cultures: Had S. epidermidis and S. cohnii on 1 of 2 blood cultures at admission, Micrococcus luteus on aerobic bottles of both collections when repeated 2/27, still suspected contaminant per ID. - Monitoring off abx for now. - Repeat blood cultures drawn 3/4 per ID.  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 - HH to be arranged at discharge. Will need lift due to deconditioning from hospitalization.  Psoriasis:  - Dermatology follow up recommended.   [name], MD [facility] [email or web address] 01/06/2023, 1:24 PM

## 2023-01-06 NOTE — Progress Notes (Signed)
   Subjective/Chief Complaint: Denies abdominal pain. Had a BM yesterday. States he threw up this morning, before breakfast. And now he feels better.    Objective: Vital signs in last 24 hours: Temp:  [98.1 F (36.7 C)-98.2 F (36.8 C)] 98.2 F (36.8 C) (03/04 0552) Pulse Rate:  [98-110] 98 (03/04 0552) Resp:  [18] 18 (03/04 0552) BP: (121-129)/(76-80) 129/80 (03/04 0552) SpO2:  [90 %-94 %] 94 % (03/04 0552) Last BM Date : 01/05/23  Intake/Output from previous day: 03/03 0701 - 03/04 0700 In: 1271.2 [P.O.:150; I.V.:1121.2] Out: 2 [Urine:1; Stool:1] Intake/Output this shift: Total I/O In: -  Out: 300 [Emesis/NG output:300]  Abd - soft, non-tender  Lab Results:  Recent Labs    01/05/23 0858 01/06/23 0313  WBC 14.0* 16.7*  HGB 11.8* 11.9*  HCT 37.6* 38.4*  PLT 321 343   BMET Recent Labs    01/05/23 0858 01/06/23 0313  NA 134* 136  K 3.7 3.8  CL 106 104  CO2 24 25  GLUCOSE 124* 120*  BUN <5* <5*  CREATININE 0.40* 0.41*  CALCIUM 7.4* 7.8*   PT/INR No results for input(s): "LABPROT", "INR" in the last 72 hours. ABG No results for input(s): "PHART", "HCO3" in the last 72 hours.  Invalid input(s): "PCO2", "PO2"  Studies/Results: No results found.  Anti-infectives: Anti-infectives (From admission, onward)    Start     Dose/Rate Route Frequency Ordered Stop   01/03/23 0600  nirmatrelvir/ritonavir (PAXLOVID) 3 tablet        3 tablet Oral 2 times daily 01/03/23 0304 01/08/23 0959   12/31/22 1600  vancomycin (VANCOREADY) IVPB 1750 mg/350 mL  Status:  Discontinued        1,750 mg 175 mL/hr over 120 Minutes Intravenous Every 12 hours 12/31/22 1503 01/02/23 1733   12/31/22 1600  piperacillin-tazobactam (ZOSYN) IVPB 3.375 g  Status:  Discontinued        3.375 g 12.5 mL/hr over 240 Minutes Intravenous Every 8 hours 12/31/22 1503 01/03/23 0601       Assessment/Plan: SBO - no prior h/o abdominal surgery - CT scan on admission reports transition point in  the jejunum concerning for a low-grade or functional small-bowel obstruction; the distal small bowel is decompressed. CT also shows a large amount of stool in the colon, as well as a distended and fluid-filled stomach. - CT repeated 2/27 due to hiccups and constant upper abdominal/esophageal spasms- SBO resolved, stomach remains distended with retained contrast and with high NG output. 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 Recommend GI follow up for further workup. - NG was removed after clamp trials last week but pt w/ ongoing nausea and intermittent vomiting. Continue Soft diet as tolerated. Will schedule reglan 10 mg TID starting today for 3 days. If unable to progress with diet then may end up needing to re-engage GI while the patient is inpatient. - Continue aggressive bowel regimen, add on oral regimen once tolerating PO intake.    COVID positive -undergoing treatment. History of spinal cord ependymoma  Bedbound Chronic constipation Hx psoriasis  LOS: 8 days    Jill Alexanders 01/06/2023

## 2023-01-07 ENCOUNTER — Inpatient Hospital Stay (HOSPITAL_COMMUNITY): Payer: Medicaid Other

## 2023-01-07 DIAGNOSIS — R1110 Vomiting, unspecified: Secondary | ICD-10-CM | POA: Diagnosis not present

## 2023-01-07 DIAGNOSIS — K5669 Other partial intestinal obstruction: Secondary | ICD-10-CM | POA: Diagnosis not present

## 2023-01-07 DIAGNOSIS — C720 Malignant neoplasm of spinal cord: Secondary | ICD-10-CM | POA: Diagnosis not present

## 2023-01-07 DIAGNOSIS — K56609 Unspecified intestinal obstruction, unspecified as to partial versus complete obstruction: Secondary | ICD-10-CM | POA: Diagnosis not present

## 2023-01-07 LAB — BASIC METABOLIC PANEL
Anion gap: 6 (ref 5–15)
BUN: <5 mg/dL — ABNORMAL LOW (ref 6–20)
CO2: 25 mmol/L (ref 22–32)
Calcium: 7.5 mg/dL — ABNORMAL LOW (ref 8.9–10.3)
Chloride: 102 mmol/L (ref 98–111)
Creatinine, Ser: 0.45 mg/dL — ABNORMAL LOW (ref 0.61–1.24)
GFR, Estimated: >60 mL/min (ref >60)
Glucose, Bld: 113 mg/dL — ABNORMAL HIGH (ref 70–99)
Potassium: 3.6 mmol/L (ref 3.5–5.1)
Sodium: 133 mmol/L — ABNORMAL LOW (ref 135–145)

## 2023-01-07 MED ORDER — METOCLOPRAMIDE HCL 5 MG PO TABS: 10.0000 mg | ORAL_TABLET | Freq: Two times a day (BID) | ORAL | Status: DC

## 2023-01-07 MED ORDER — SIMETHICONE 40 MG/0.6ML PO SUSP
40.0000 mg | Freq: Four times a day (QID) | ORAL | Status: DC
  Administered 2023-01-07 – 2023-01-12 (×16): 40 mg via ORAL
  Filled 2023-01-07 (×23): qty 0.6

## 2023-01-07 MED ORDER — METOCLOPRAMIDE HCL 5 MG PO TABS
10.0000 mg | ORAL_TABLET | Freq: Three times a day (TID) | ORAL | Status: AC
  Administered 2023-01-07 – 2023-01-09 (×8): 10 mg via ORAL
  Filled 2023-01-07 (×8): qty 2

## 2023-01-07 MED ORDER — LINEZOLID 600 MG PO TABS
600.0000 mg | ORAL_TABLET | Freq: Two times a day (BID) | ORAL | Status: DC
  Administered 2023-01-07 – 2023-01-09 (×5): 600 mg via ORAL
  Filled 2023-01-07 (×6): qty 1

## 2023-01-07 NOTE — Progress Notes (Signed)
[facility] PROGRESS NOTE  Armour Silver (DOB: 12/13/1965) [id number] PCP: Nicholes Rough, PA-C  Brief Narrative: Carlos [name] is a 57 year old male with history of spinal ependymoma status post surgery, psoriasis,bed bound  who presented with 5-day history of abdominal pain, nausea, no bowel movement no history of abdominal surgery in the past.  History of chronic constipation.  On presentation lab work showed sodium 132, potassium 3.4, WBC of 17.3.  CT scan showed functional low-grade SBO with large amount of stool in the colon.  Started on conservative management after admitted for the management of low-grade SBO. NG tube placed.  General surgery consulted and following. GI also consulted for concern of motility disorder. Patient has had return of bowel function, NG tube out 3/1. Developed fever with positive sick contact and viral panel positive for covid-19 infection 2/29.  Subjective: Had BM yesterday, feeling a lot of gas, belly a bit swollen. +nausea without vomiting, though trying to eat more, has taken several spoonfuls of yogurt this AM.   Objective: BP 113/78 (BP Location: Right Arm)   Pulse (!) 114   Temp 98.5 F (36.9 C)   Resp 20   Ht '6\' 4"'$  (1.93 m)   Wt 90.7 kg   SpO2 97%   BMI 24.34 kg/m   Gen: Pleasant male appearing uncomfortable with frequent burping/hiccups Pulm: Clear, nonlabored  CV: RRR, no MRG or edema GI: Soft, distended with hypoactive bowel sounds, not focally tender. Neuro: Alert and oriented. No new focal deficits. Ext: Warm, stable contractures Skin: No new rashes, lesions or ulcers on visualized skin   Assessment & Plan: Ileus: Primary etiology felt to be GI hypomotility/dysmotility.   - XR repeated continues to show dilatation of GI tract including stomach, small and large bowel.  - GI consulted, recommends gastroscintigraphy as an outpatient.  - Continue miralax, metamucil, and dulcolax suppository daily   - Having nausea, not yet able to push  oral intake but having BMs. Scheduled reglan per surgery who signed off 3/5. Add simethicone.  - Continue IV therapies while trial of po.  Covid-19 infection:  - Continue paxlovid (3/1 - 3/5) - Airborne/contact precautions per protocol - Supportive care and incentive spirometry  Contaminated blood cultures: Had S. epidermidis and S. cohnii on 1 of 2 blood cultures at admission, Micrococcus luteus on aerobic bottles of both collections when repeated 2/27, still suspected contaminant per ID.  - Repeat blood cultures drawn 3/4, NGTD at this time. With persistent leukocytosis and inability to rule out true infection, will initiate linezolid pr ID 3/5.   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 - HH to be arranged at discharge. Will need lift due to deconditioning from hospitalization.  Psoriasis:  - Dermatology follow up recommended.   [name], MD [facility] [email or web address] 01/07/2023, 2:56 PM

## 2023-01-07 NOTE — Plan of Care (Signed)
  Problem: Education: Goal: Knowledge of General Education information will improve Description: Including pain rating scale, medication(s)/side effects and non-pharmacologic comfort measures Outcome: Progressing   Problem: Nutrition: Goal: Adequate nutrition will be maintained Outcome: Progressing   

## 2023-01-07 NOTE — Progress Notes (Signed)
   Subjective/Chief Complaint: Pt tol PO yesterday +BM KUB reviewed   Objective: Vital signs in last 24 hours: Temp:  [98 F (36.7 C)-98.6 F (37 C)] 98.6 F (37 C) (03/05 0524) Pulse Rate:  [109-110] 110 (03/05 0524) Resp:  [17-19] 17 (03/05 0524) BP: (110-114)/(73-82) 110/73 (03/05 0524) SpO2:  [94 %] 94 % (03/05 0524) Last BM Date : 01/05/23  Intake/Output from previous day: 03/04 0701 - 03/05 0700 In: 878.1 [I.V.:878.1] Out: 1550 [Urine:650; Emesis/NG output:900] Intake/Output this shift: No intake/output data recorded.  PE:  Constitutional: No acute distress, conversant, appears states age. Eyes: Anicteric sclerae, moist conjunctiva, no lid lag Lungs: Clear to auscultation bilaterally, normal respiratory effort CV: regular rate and rhythm, no murmurs, no peripheral edema, pedal pulses 2+ GI: Soft, no masses or hepatosplenomegaly, non-tender to palpation Skin: No rashes, palpation reveals normal turgor Psychiatric: appropriate judgment and insight, oriented to person, place, and time   Lab Results:  Recent Labs    01/05/23 0858 01/06/23 0313  WBC 14.0* 16.7*  HGB 11.8* 11.9*  HCT 37.6* 38.4*  PLT 321 343   BMET Recent Labs    01/06/23 0313 01/07/23 0400  NA 136 133*  K 3.8 3.6  CL 104 102  CO2 25 25  GLUCOSE 120* 113*  BUN <5* <5*  CREATININE 0.41* 0.45*  CALCIUM 7.8* 7.5*   PT/INR No results for input(s): "LABPROT", "INR" in the last 72 hours. ABG No results for input(s): "PHART", "HCO3" in the last 72 hours.  Invalid input(s): "PCO2", "PO2"  Studies/Results: No results found.  Anti-infectives: Anti-infectives (From admission, onward)    Start     Dose/Rate Route Frequency Ordered Stop   01/03/23 0600  nirmatrelvir/ritonavir (PAXLOVID) 3 tablet        3 tablet Oral 2 times daily 01/03/23 0304 01/08/23 0959   12/31/22 1600  vancomycin (VANCOREADY) IVPB 1750 mg/350 mL  Status:  Discontinued        1,750 mg 175 mL/hr over 120 Minutes  Intravenous Every 12 hours 12/31/22 1503 01/02/23 1733   12/31/22 1600  piperacillin-tazobactam (ZOSYN) IVPB 3.375 g  Status:  Discontinued        3.375 g 12.5 mL/hr over 240 Minutes Intravenous Every 8 hours 12/31/22 1503 01/03/23 0601       Assessment/Plan: SBO - no prior h/o abdominal surgery - Continue Soft diet as tolerated. Con't reglan 10 mg TID starting today for 3 days.  - Continue aggressive bowel regimen, add on oral regimen once tolerating PO intake. -As pt is having BMs and tol PO, no role for surgery at this point.  KUB shows dilated stomach and sb and large bowel, likely related to his dysmotility, which is likely 2/2 to his spinal condition -please call back if needed   COVID positive -undergoing treatment. History of spinal cord ependymoma  Bedbound Chronic constipation Hx psoriasis  Moderate medical decision making  LOS: 9 days    Ralene Ok 01/07/2023

## 2023-01-08 DIAGNOSIS — E43 Unspecified severe protein-calorie malnutrition: Secondary | ICD-10-CM | POA: Diagnosis present

## 2023-01-08 DIAGNOSIS — E876 Hypokalemia: Secondary | ICD-10-CM | POA: Diagnosis present

## 2023-01-08 DIAGNOSIS — C720 Malignant neoplasm of spinal cord: Secondary | ICD-10-CM | POA: Diagnosis not present

## 2023-01-08 DIAGNOSIS — R7881 Bacteremia: Secondary | ICD-10-CM | POA: Diagnosis present

## 2023-01-08 DIAGNOSIS — L409 Psoriasis, unspecified: Secondary | ICD-10-CM | POA: Diagnosis present

## 2023-01-08 DIAGNOSIS — U071 COVID-19: Secondary | ICD-10-CM

## 2023-01-08 DIAGNOSIS — K5989 Other specified functional intestinal disorders: Secondary | ICD-10-CM

## 2023-01-08 LAB — BASIC METABOLIC PANEL
Anion gap: 8 (ref 5–15)
BUN: <5 mg/dL — ABNORMAL LOW (ref 6–20)
CO2: 25 mmol/L (ref 22–32)
Calcium: 7.6 mg/dL — ABNORMAL LOW (ref 8.9–10.3)
Chloride: 101 mmol/L (ref 98–111)
Creatinine, Ser: 0.49 mg/dL — ABNORMAL LOW (ref 0.61–1.24)
GFR, Estimated: >60 mL/min (ref >60)
Glucose, Bld: 116 mg/dL — ABNORMAL HIGH (ref 70–99)
Potassium: 3.6 mmol/L (ref 3.5–5.1)
Sodium: 134 mmol/L — ABNORMAL LOW (ref 135–145)

## 2023-01-08 MED ORDER — BOOST / RESOURCE BREEZE PO LIQD CUSTOM: 1.0000 | ORAL | Status: DC

## 2023-01-08 MED ORDER — ADULT MULTIVITAMIN W/MINERALS CH
1.0000 | ORAL_TABLET | Freq: Every day | ORAL | Status: DC
  Administered 2023-01-09 – 2023-01-12 (×4): 1 via ORAL
  Filled 2023-01-08 (×4): qty 1

## 2023-01-08 MED ORDER — ENSURE ENLIVE PO LIQD
237.0000 mL | Freq: Two times a day (BID) | ORAL | Status: DC
  Administered 2023-01-08: 237 mL via ORAL

## 2023-01-08 MED ORDER — HYDROCORTISONE 1 % EX CREA
TOPICAL_CREAM | Freq: Two times a day (BID) | CUTANEOUS | Status: DC
  Administered 2023-01-08 – 2023-01-10 (×2): 1 via TOPICAL
  Filled 2023-01-08: qty 28

## 2023-01-08 NOTE — Progress Notes (Signed)
PROGRESS NOTE   Carlos [name]  [id number] DOB: [birth year] DOA: 12/28/2022 PCP: Nicholes Rough, PA-C   Date of Service: the patient was seen and examined on 01/08/2023  Brief Narrative:  57 year old male with history of spinal ependymoma status post surgery, psoriasis,bed bound  who presented with 5-day history of abdominal pain,  On admission, CT scan showed functional low-grade SBO with large amount of stool in the colon.  Hospitalist group was contacted and patient was admitted to the hospital.    General surgery was consulted as well as gastroenterology.  NG tube was temporarily placed which has subsequently been removed on 3/1 with return of bowel function.  Hospital course complicated by [id number] infection with positivity on 2/29.  Patient was treated with a 5-day course of Paxlovid from 3/1 until 3/5.  Concerning the etiology of the ileus/low-grade bowel obstruction, GI feels that this is secondary to GI hypomotility/dysmotility.  They recommend gastroscintigraphy in the outpatient setting after discharge.    Assessment and Plan: * Generalized dysmotility of intestine Patient continuing to experience substantial reduction in oral intake due to ongoing sensation of bloating and nausea Transient small bowel obstruction/ileus  Repeated x-rays continuing to show dilation of GI tract including stomach small and large bowel Both GI and surgery consulted.  Patient felt to be suffering from intestinal dysmotility secondary to history of spinal ependymoma GI recommending outpatient gastroscintigraphy Continue to encourage patient to take oral intake, obtain nutrition consultation Continue daily bowel regimen  SBO (small bowel obstruction) ([place]) See assessment and plan above  Protein-calorie malnutrition, severe ([place]) Concern for progression of severe protein calorie malnutrition due to poor oral intake due to ongoing dysmotility Encouraging oral intake Ensure supplements Performing  calorie count  COVID-19 virus infection 5-day course of Paxlovid completed on 3/5 Continuing contact and airborne isolation to complete 10-day span  Bacteremia Concern for Micrococcus bacteremia based on 2/27 culture Repeat cultures on 3/4 revealed no growth Dr. 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  Patient states that is difficult for him to eat due to this feeling of always being bloated.  Patient denies any pain.  Physical Exam:  Vitals:   01/08/23 0357 01/08/23 0736 01/08/23 1208 01/08/23 2126  BP: 106/68 102/71 112/65 104/75  Pulse: (!) 115 (!) 110 (!) 107 (!) 116  Resp: '19 17 18 19  '$ Temp: 98.2 F (36.8 C) 98.8 F (37.1 C) 98.7 F (37.1 C) 98.8 F (37.1 C)  TempSrc:   Oral   SpO2: 94% 94% 96% 96%  Weight:      Height:        Constitutional: Awake alert and oriented x3, no associated distress.  Significant evidence of muscle wasting. Skin: Numerous scaly rashes over the face with areas of excoriation.  Poor skin turgor overall.   Eyes: Pupils are equally reactive to light.  No evidence of scleral icterus or conjunctival pallor.  ENMT: Dry mucous membranes noted.  Posterior pharynx clear of any exudate or lesions.   Respiratory: Coarse breath sounds bilaterally.  No evidence of wheezing.  No evidence of increased respiratory effort or accessory muscle use. Cardiovascular: Regular rate and rhythm,  no murmurs / rubs / gallops. No extremity edema. 2+ pedal pulses. No carotid bruits.  Abdomen: Abdomen is distended but soft and  nontender.  Hypoactive bowel sounds noted.   Musculoskeletal: Notable contractures of the left hand.  Extremely poor muscle tone.    Data Reviewed:  I have personally reviewed and interpreted labs, imaging.  Significant findings are   CBC: Recent Labs  Lab 01/03/23 0421 01/05/23 0858 01/06/23 0313  WBC 12.1* 14.0* 16.7*  NEUTROABS  --   --  11.9*  HGB 11.4* 11.8* 11.9*  HCT 36.7* 37.6* 38.4*  MCV 86.8 85.8 85.1  PLT 224 321 [id number]   Basic Metabolic Panel: Recent Labs  Lab 01/03/23 0421 01/05/23 0858 01/06/23 0313 01/07/23 0400 01/08/23 0350  NA 138 134* 136 133* 134*  K 3.0* 3.7 3.8 3.6 3.6  CL 109 106 104 102 101  CO2 17* '24 25 25 25  '$ GLUCOSE 87 124* 120* 113* 116*  BUN 8 <5* <5* <5* <5*  CREATININE 0.58* 0.40* 0.41* 0.45* 0.49*  CALCIUM 7.7* 7.4* 7.8* 7.5* 7.6*     Code Status:  Full code.  Code status decision has been confirmed with: patient    Severity of Illness:  The appropriate patient status for this patient is INPATIENT. Inpatient status is judged to be reasonable and necessary in order to provide the required intensity of service to ensure the patient's safety. The patient's presenting symptoms, physical exam findings, and initial radiographic and laboratory data in the context of their chronic comorbidities is felt to place them at high risk for further clinical deterioration. Furthermore, it is not anticipated that the patient will be medically stable for discharge from the hospital within 2 midnights of admission.   * I certify that at the point of admission it is my clinical judgment that the patient will require inpatient hospital care spanning beyond 2 midnights from the point of admission due to high intensity of service, high risk for further deterioration and high frequency of surveillance required.*  Time spent:  50 minutes  Author:  Vernelle Emerald MD  01/08/2023 11:13 PM

## 2023-01-08 NOTE — TOC Progression Note (Signed)
Transition of Care [facility]) - Progression Note   Patient Details  Name: Carlos [name] MRN: [id number] Date of Birth: 03-Feb-1966  Transition of Care [facility]) CM/SW [place], LCSW Phone Number: 01/08/2023, 9:35 AM  Clinical Narrative: CSW followed up with Erasmo Downer with [employer] and the [facility] lift and transport chair will be delivered to the patient's residence today.  Expected Discharge Plan: [place] Barriers to Discharge: Continued Medical Work up  Expected Discharge Plan and Services In-house Referral: Clinical Social Work Post Acute Care Choice: [place] arrangements for the past 2 months: [place]           DME Arranged: Other see comment [facility] lift, transport chair) DME Agency: [employer] Date DME Agency Contacted: 01/06/23 Representative spoke with at DME Agency: Erasmo Downer HH Arranged: PT [place]: [place] Date [place]: 12/30/22 Representative spoke with at [place]: [place] ([place]) Interventions SDOH Screenings   Tobacco Use: Low Risk  (12/29/2022)   Readmission Risk Interventions     No data to display

## 2023-01-08 NOTE — Assessment & Plan Note (Addendum)
As needed topical steroids Seemingly improving

## 2023-01-08 NOTE — Assessment & Plan Note (Signed)
Replaced 

## 2023-01-08 NOTE — Assessment & Plan Note (Addendum)
Patient felt to be suffering from intestinal dysmotility secondary to history of spinal ependymoma by both surgery and gastroenterology Patient reports continued improvement in oral intake Actively monitoring oral intake including calorie count  After admission, GI recommending outpatient gastroscintigraphy -will need to arrange for outpatient follow-up upon discharge. Encouraging oral intake Nutrition assisting with calorie count to ensure caloric needs are being met Continue daily bowel regimen.  Adding an enema today. If patient fails to consume sufficient calories we may need to resort to alternative sources of nutrition.

## 2023-01-08 NOTE — Assessment & Plan Note (Signed)
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

## 2023-01-08 NOTE — Assessment & Plan Note (Addendum)
Concern for progression of severe protein calorie malnutrition due to poor oral intake due to ongoing dysmotility Oral intake reportedly slowly improving. Undergoing calorie count with the assistance of nutrition which is appreciated Encouraging oral intake Ensure supplements If oral intake fails to significantly improve we may need to resort to alternative sources of nutrition.

## 2023-01-08 NOTE — Hospital Course (Addendum)
57 year old male with history of spinal ependymoma status post surgery, psoriasis,bed bound  who presented with 5-day history of abdominal pain,  On admission, CT scan showed functional low-grade SBO with large amount of stool in the colon.  Hospitalist group was contacted and patient was admitted to the hospital.    General surgery was consulted as well as gastroenterology.  NG tube was temporarily placed which has subsequently been removed on 3/1 with return of bowel function.  Hospital course complicated by [id number] infection with positivity on 2/29.  Patient was treated with a 5-day course of Paxlovid from 3/1 until 3/5.  Concerning the etiology of the ileus/low-grade bowel obstruction, will general surgery and GI were both concerned that this could possibly be secondary to hypomotility/dysmotility of the gut.  After this hospitalization they recommended further outpatient workup.  In the days that followed patient's oral intake slowly improved as did the patient's abdominal bloating and discomfort.    Of note, also during this hospitalization patient was found to have a blood culture on 2/27 positive for micrococcus and another culture on 2/25 positive for staph cohnii/epidermidis.  Infectious ease was consulted and patient was empirically treated with a course of Zyvox.  After repeated blood cultures that exhibited no growth this was felt to be a contaminant and antibiotics were discontinued.    It is also worth noting the patient has exhibited leukocytosis throughout the hospitalization  despite a number of  workups for underlying sources of infection that have been negative.  At this point it is felt that the patient's leukocytosis is chronic as blood work in 2020 also shows leukocytosis.  Patient has been recommended to follow-up with his primary care provider for repeat CBC in several weeks and if this persists to consider a hematology referral.    Upon airing for discharge home, arrangements  have been made for the patient to receive home health physical therapy services.  DME has also been ordered for the patient's home including a [place] lift.  Patient has been instructed to follow-up closely with his primary care provider and follow-up as an outpatient with gastroenterology for potential outpatient workup of possible GI dysmotility.  Patient has been discharged home on 3/10 in improved and stable condition.

## 2023-01-08 NOTE — Assessment & Plan Note (Addendum)
COVID-19 PCR positive on 2/29 5-day course of Paxlovid completed on 3/5 Continuing contact and airborne isolation to complete 10-day span which will complete on 3/9

## 2023-01-08 NOTE — Assessment & Plan Note (Signed)
See assessment and plan above

## 2023-01-08 NOTE — Progress Notes (Signed)
Nutrition Follow-up  DOCUMENTATION CODES:   Not applicable  INTERVENTION:  - Soft diet per MD.  - Ensure Plus High Protein po BID, each supplement provides 350 kcal and 20 grams of protein. - Boost Breeze po once daily, provides 250 kcal and 9 grams of protein - Encourage intake of small and frequent meals throughout the day in addition to supplements.   - 48 hour calorie count to start today (3/6) at breakfast and run through dinner tomorrow (3/7).  - Please document % intake of all foods, drinks, and nutrition supplements patient consumes.   - If patient skips/refuses meals please document 0% for that meal.  - Discussed with RN.   - Will order daily multivitamin to support micronutrient needs. - Monitor weight trends.    NUTRITION DIAGNOSIS:   Inadequate oral intake related to altered GI function, poor appetite, nausea as evidenced by energy intake < 75% for > 7 days.   GOAL:   Patient will meet greater than or equal to 90% of their needs *unmet  MONITOR:   PO intake, Supplement acceptance, Diet advancement, Weight trends  REASON FOR ASSESSMENT:   Consult Calorie Count  ASSESSMENT:   57 y.o. male with past medical history of spinal ependymoma status post surgery, bedbound, admitted on 12/28/2022 with abdominal pain, nausea and vomiting. Concern for SBO versus motility disorder.  2/24 Admit 2/25 NG placed to LIS 3/1 NG removed, CL diet 3/2 DYS 1 diet  3/3 Soft diet  Patient reports his appetite is very poor. He has only been having small snacks of foods such as jello, crackers, liquids. He admits he has been experiencing abdominal distension and nausea which has also hindered intake.  Encouraged patient to aim to consume small and frequent snacks throughout the day as tolerated to slowly increase intake and stimulate appetite.  Patient is motivated to try. Agreeable to receive Ensure (which he drinks at home) and Boost Breeze to support intake.   Received  consult for calorie count after visit.  Discussed calorie count with RN. She reports all patient has had today was 74m of ginger ale and a couple bites of saltines.     Medications reviewed and include: Dulcolax, Reglan, Senokot, Zofran D5 @ 786mhr = 306 kcals over 24 hours  Labs reviewed:  Na 134   Diet Order:   Diet Order             DIET SOFT Room service appropriate? Yes; Fluid consistency: Thin  Diet effective now                   EDUCATION NEEDS:  Education needs have been addressed  Skin:  Skin Assessment: Reviewed RN Assessment  Last BM:  3/5  Height:  Ht Readings from Last 1 Encounters:  12/28/22 '6\' 4"'$  (1.93 m)   Weight:  Wt Readings from Last 1 Encounters:  12/28/22 90.7 kg    BMI:  Body mass index is 24.34 kg/m.  Estimated Nutritional Needs:  Kcal:  2250-2450 kcals Protein:  100-115 grams Fluid:  >/= 2.2L    AsSamson FredericD, LDN For contact information, refer to [facility]

## 2023-01-08 NOTE — Progress Notes (Signed)
[facility] for Infectious Disease    Date of Admission:  12/28/2022   Total days of antibiotics 5 day of paxlovid   ID: Carlos [name] is a 57 y.o. male with   Principal Problem:   SBO (small bowel obstruction) ([place]) Active Problems:   Leukocytosis   Spinal cord ependymoma ([facility])   S/P laminectomy   Joint contracture of the upper arm   Contracture of hand   Lab test positive for detection of COVID-19 virus    Subjective: Hiccups and gastric distention. No fevers. Improved cough  Medications:   bisacodyl  10 mg Rectal Daily   enoxaparin (LOVENOX) injection  40 mg Subcutaneous Q24H   feeding supplement  1 Container Oral Q24H   feeding supplement  237 mL Oral BID BM   hydrocortisone cream   Topical BID   linezolid  600 mg Oral Q12H   melatonin  3 mg Oral QHS   metoCLOPramide  10 mg Oral TID AC   [START ON 01/09/2023] multivitamin with minerals  1 tablet Oral Daily   senna  1 tablet Oral Daily   simethicone  40 mg Oral QID   sodium chloride flush  3 mL Intravenous Q12H    Objective: Vital signs in last 24 hours: Temp:  [98.2 F (36.8 C)-99.1 F (37.3 C)] 98.7 F (37.1 C) (03/06 1208) Pulse Rate:  [100-117] 107 (03/06 1208) Resp:  [17-19] 18 (03/06 1208) BP: (102-119)/(65-86) 112/65 (03/06 1208) SpO2:  [94 %-98 %] 96 % (03/06 1208)  Physical Exam  Constitutional: He is oriented to person, place, and time. He appears well-developed and well-nourished. No distress.  HENT:  Mouth/Throat: Oropharynx is clear and moist. No oropharyngeal exudate.  Cardiovascular: Normal rate, regular rhythm and normal heart sounds. Exam reveals no gallop and no friction rub.  No murmur heard.  Pulmonary/Chest: Effort normal and breath sounds normal. No respiratory distress. He has no wheezes.  Abdominal: Soft. Bowel sounds are decreased. +distension. There is no tenderness.  Lymphadenopathy:  He has no cervical adenopathy.  Neurological: He is alert and oriented to person, place, and  time.  Skin: Skin is warm and dry. No rash noted. No erythema.  Psychiatric: He has a normal mood and affect. His behavior is normal.    Lab Results Recent Labs    01/06/23 0313 01/07/23 0400 01/08/23 0350  WBC 16.7*  --   --   HGB 11.9*  --   --   HCT 38.4*  --   --   NA 136 133* 134*  K 3.8 3.6 3.6  CL 104 102 101  CO2 '25 25 25  '$ BUN <5* <5* <5*  CREATININE 0.41* 0.45* 0.49*   Liver Panel No results for input(s): "PROT", "ALBUMIN", "AST", "ALT", "ALKPHOS", "BILITOT", "BILIDIR", "IBILI" in the last 72 hours. Sedimentation Rate No results for input(s): "ESRSEDRATE" in the last 72 hours. C-Reactive Protein No results for input(s): "CRP" in the last 72 hours.  Microbiology: 3/4  blood cx NGTD 2/27 blood cx Micrococcus in 2sets 2/25 blood cx staph and strep in 1 set Studies/Results: DG Abd Portable 1V  Result Date: 01/07/2023 CLINICAL DATA:  Abdominal distention, vomiting EXAM: PORTABLE ABDOMEN - 1 VIEW COMPARISON:  01/01/2023 FINDINGS: There is interval removal of enteric tube. Stomach is distended. There are few slightly dilated gas-filled small bowel loops in upper mid abdomen. Gas and stool are present in colon. IMPRESSION: Mild gaseous dilation of small bowel loops may suggest ileus. There is moderate gaseous distention of stomach.  Electronically Signed   By: Elmer Picker M.D.   On: 01/07/2023 08:13     Assessment/Plan: Covid pneumonia = finish paxlovid today  Micrococcus bacteremia = unclear if this is skin contaminant vs true pathogen. Has been off of abtx temporarily. Will restart abtx with linezolid and see if 3/4 cultures identify anything at 72hrs to decide if need to treat as true bacteremia  Leukocytosis = could be from inflammation/sbo/ileus   [facility] for Infectious Diseases Pager: [phone]  01/08/2023, 4:56 PM

## 2023-01-08 NOTE — Assessment & Plan Note (Addendum)
Concern for Micrococcus bacteremia based on 2/27 culture 2/25 staph cohnii/epidermidis in 1 of 2 sets as well Repeat cultures on 3/4 revealed no growth thus far infectious disease following With negative growth on repeat culture, infectious disease recommends discontinuation of Zyvox

## 2023-01-09 ENCOUNTER — Inpatient Hospital Stay (HOSPITAL_COMMUNITY): Payer: Medicaid Other

## 2023-01-09 DIAGNOSIS — J189 Pneumonia, unspecified organism: Secondary | ICD-10-CM | POA: Diagnosis not present

## 2023-01-09 DIAGNOSIS — J90 Pleural effusion, not elsewhere classified: Secondary | ICD-10-CM | POA: Diagnosis not present

## 2023-01-09 DIAGNOSIS — J9811 Atelectasis: Secondary | ICD-10-CM | POA: Diagnosis not present

## 2023-01-09 DIAGNOSIS — R6510 Systemic inflammatory response syndrome (SIRS) of non-infectious origin without acute organ dysfunction: Secondary | ICD-10-CM | POA: Diagnosis present

## 2023-01-09 LAB — CBC WITH DIFFERENTIAL/PLATELET
Abs Immature Granulocytes: 0.8 10*3/uL — ABNORMAL HIGH (ref 0.00–0.07)
Basophils Absolute: 0.1 10*3/uL (ref 0.0–0.1)
Basophils Relative: 1 %
Eosinophils Absolute: 0.5 10*3/uL (ref 0.0–0.5)
Eosinophils Relative: 3 %
HCT: 33.6 % — ABNORMAL LOW (ref 39.0–52.0)
Hemoglobin: 10.8 g/dL — ABNORMAL LOW (ref 13.0–17.0)
Immature Granulocytes: 5 %
Lymphocytes Relative: 12 %
Lymphs Abs: 2.1 10*3/uL (ref 0.7–4.0)
MCH: 26.9 pg (ref 26.0–34.0)
MCHC: 32.1 g/dL (ref 30.0–36.0)
MCV: 83.6 fL (ref 80.0–100.0)
Monocytes Absolute: 2.1 10*3/uL — ABNORMAL HIGH (ref 0.1–1.0)
Monocytes Relative: 12 %
Neutro Abs: 11.6 10*3/uL — ABNORMAL HIGH (ref 1.7–7.7)
Neutrophils Relative %: 67 %
Platelets: 396 10*3/uL (ref 150–400)
RBC: 4.02 MIL/uL — ABNORMAL LOW (ref 4.22–5.81)
RDW: 13.2 % (ref 11.5–15.5)
WBC: 17.1 10*3/uL — ABNORMAL HIGH (ref 4.0–10.5)
nRBC: 0 % (ref 0.0–0.2)

## 2023-01-09 LAB — COMPREHENSIVE METABOLIC PANEL
ALT: 13 U/L (ref 0–44)
AST: 14 U/L — ABNORMAL LOW (ref 15–41)
Albumin: 1.9 g/dL — ABNORMAL LOW (ref 3.5–5.0)
Alkaline Phosphatase: 28 U/L — ABNORMAL LOW (ref 38–126)
Anion gap: 6 (ref 5–15)
BUN: 8 mg/dL (ref 6–20)
CO2: 23 mmol/L (ref 22–32)
Calcium: 7.4 mg/dL — ABNORMAL LOW (ref 8.9–10.3)
Chloride: 103 mmol/L (ref 98–111)
Creatinine, Ser: 0.35 mg/dL — ABNORMAL LOW (ref 0.61–1.24)
GFR, Estimated: >60 mL/min (ref >60)
Glucose, Bld: 116 mg/dL — ABNORMAL HIGH (ref 70–99)
Potassium: 3.8 mmol/L (ref 3.5–5.1)
Sodium: 132 mmol/L — ABNORMAL LOW (ref 135–145)
Total Bilirubin: 0.4 mg/dL (ref 0.3–1.2)
Total Protein: 4.7 g/dL — ABNORMAL LOW (ref 6.5–8.1)

## 2023-01-09 LAB — URINALYSIS, ROUTINE W REFLEX MICROSCOPIC
Bilirubin Urine: NEGATIVE
Glucose, UA: NEGATIVE mg/dL
Hgb urine dipstick: NEGATIVE
Ketones, ur: NEGATIVE mg/dL
Leukocytes,Ua: NEGATIVE
Nitrite: NEGATIVE
Protein, ur: NEGATIVE mg/dL
Specific Gravity, Urine: 1.008 (ref 1.005–1.030)
pH: 9 — ABNORMAL HIGH (ref 5.0–8.0)

## 2023-01-09 LAB — PROCALCITONIN: Procalcitonin: <0.1 ng/mL

## 2023-01-09 LAB — MAGNESIUM: Magnesium: 1.5 mg/dL — ABNORMAL LOW (ref 1.7–2.4)

## 2023-01-09 LAB — LACTIC ACID, PLASMA: Lactic Acid, Venous: 1.1 mmol/L (ref 0.5–1.9)

## 2023-01-09 MED ORDER — ACETAMINOPHEN 325 MG PO TABS
650.0000 mg | ORAL_TABLET | ORAL | Status: DC | PRN
  Administered 2023-01-09: 650 mg via ORAL
  Filled 2023-01-09: qty 2

## 2023-01-09 MED ORDER — ENSURE ENLIVE PO LIQD
237.0000 mL | Freq: Three times a day (TID) | ORAL | Status: DC
  Administered 2023-01-09 – 2023-01-12 (×8): 237 mL via ORAL

## 2023-01-09 MED ORDER — MAGNESIUM SULFATE 4 GM/100ML IV SOLN
4.0000 g | Freq: Once | INTRAVENOUS | Status: AC
  Administered 2023-01-09: 4 g via INTRAVENOUS
  Filled 2023-01-09: qty 100

## 2023-01-09 NOTE — Progress Notes (Signed)
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 Covid + 01/02/23.    PT Comments    Pt rolled L and R with mod assist of 2 for placement of lift pad. Maximove used to transfer pt from bed to recliner. Pillows placed under BUEs and LEs for positioning. Pt tolerated transfer well. Pt reports hoyer lift  was delivered to his home.    Recommendations for follow up therapy are one component of a multi-disciplinary discharge planning process, led by the attending physician.  Recommendations may be updated based on patient status, additional functional criteria and insurance authorization.  Follow Up Recommendations  Home health PT     Assistance Recommended at Discharge Frequent or constant Supervision/Assistance  Patient can return home with the following Two people to help with walking and/or transfers;A lot of help with bathing/dressing/bathroom;Assist for transportation;Help with stairs or ramp for entrance   Equipment Recommendations  Other (comment) Harrel Lemon lift, transport chair)    Recommendations for Other Services       Precautions / Restrictions Precautions Precautions: Fall;Other (comment) Precaution Comments: 01/02/23 covid +; WC bound PTA Restrictions Weight Bearing Restrictions: No     Mobility  Bed Mobility   Bed Mobility: Rolling Rolling: Mod assist, +2 for physical assistance         General bed mobility comments: rolled L and R for placement of lift pad    Transfers Overall transfer level: Needs assistance   Transfers: Bed to chair/wheelchair/BSC             General transfer comment: transferred bed to recliner with maximove Transfer via Lift Equipment: Maximove  Ambulation/Gait                    Stairs             Wheelchair Mobility    Modified Rankin (Stroke Patients Only)       Balance                                            Cognition Arousal/Alertness: Awake/alert Behavior During Therapy: WFL for tasks assessed/performed Overall Cognitive Status: Within Functional Limits for tasks assessed                                          Exercises      General Comments        Pertinent Vitals/Pain Pain Assessment Pain Assessment: No/denies pain    Home Living                          Prior Function            PT Goals (current goals can now be found in the care plan section) Acute Rehab PT Goals PT Goal Formulation: With patient Time For Goal Achievement: 01/12/23 Potential to Achieve Goals: Fair Progress towards PT goals: Progressing toward goals    Frequency    Min 3X/week      PT Plan Current plan remains appropriate    Co-evaluation  AM-PAC PT "6 Clicks" Mobility   Outcome Measure  Help needed turning from your back to your side while in a flat bed without using bedrails?: A Lot Help needed moving from lying on your back to sitting on the side of a flat bed without using bedrails?: Total Help needed moving to and from a bed to a chair (including a wheelchair)?: Total Help needed standing up from a chair using your arms (e.g., wheelchair or bedside chair)?: Total Help needed to walk in hospital room?: Total Help needed climbing 3-5 steps with a railing? : Total 6 Click Score: 7    End of Session   Activity Tolerance: Patient tolerated treatment well Patient left: in chair;with call bell/phone within reach Nurse Communication: Mobility status;Need for lift equipment PT Visit Diagnosis: Other symptoms and signs involving the nervous system (R29.898);Muscle weakness (generalized) (M62.81)     Time: [id number] PT Time Calculation (min) (ACUTE ONLY): 46  min  Charges:  $Therapeutic Activity: 38-52 mins                     Blondell Reveal Kistler PT 01/09/2023  Acute Rehabilitation Services  Office [phone]

## 2023-01-09 NOTE — Assessment & Plan Note (Addendum)
Tachycardia spontaneously resolving.  Leukocytosis still persisting however review of older labs seem to reveal chronic leukocytosis since at least 2020. Repeat procalcitonin, lactic acidosis, chest x-ray and urinalysis performed on 3/7 unremarkable.   No evidence of new sources of infection at this time. Infectious disease also saw patient today and recommended discontinuation of Zyvox

## 2023-01-09 NOTE — Progress Notes (Signed)
PROGRESS NOTE   Carlos [name]  [id number] DOB: 12-Aug-1966 DOA: 12/28/2022 PCP: Nicholes Rough, PA-C   Date of Service: the patient was seen and examined on 01/09/2023  Brief Narrative:  57 year old male with history of spinal ependymoma status post surgery, psoriasis,bed bound  who presented with 5-day history of abdominal pain,  On admission, CT scan showed functional low-grade SBO with large amount of stool in the colon.  Hospitalist group was contacted and patient was admitted to the hospital.    General surgery was consulted as well as gastroenterology.  NG tube was temporarily placed which has subsequently been removed on 3/1 with return of bowel function.  Hospital course complicated by [id number] infection with positivity on 2/29.  Patient was treated with a 5-day course of Paxlovid from 3/1 until 3/5.  Concerning the etiology of the ileus/low-grade bowel obstruction, GI feels that this is secondary to GI hypomotility/dysmotility.  They recommend gastroscintigraphy in the outpatient setting after discharge.    Assessment and Plan: * Generalized dysmotility of intestine Patient felt to be suffering from intestinal dysmotility secondary to history of spinal ependymoma by both surgery and gastroenterology Patient reports improvement in oral intake today however Transient small bowel obstruction/ileus seemingly resolved GI recommending outpatient gastroscintigraphy -will need to arrange for outpatient follow-up upon discharge. Continue to encourage patient to take oral intake Nutrition assisting with calorie count to ensure caloric needs are being met Continue daily bowel regimen  SBO (small bowel obstruction) ([place]) See assessment and plan above  Protein-calorie malnutrition, severe ([place]) Concern for progression of severe protein calorie malnutrition due to poor oral intake due to ongoing dysmotility Oral intake somewhat improved since yesterday. Undergoing calorie count with the  assistance of nutrition which is appreciated Encouraging oral intake Ensure supplements  COVID-19 virus infection COVID-19 PCR positive on 2/29 5-day course of Paxlovid completed on 3/5 Continuing contact and airborne isolation to complete 10-day span which will complete on 3/9  Bacteremia Concern for Micrococcus bacteremia based on 2/27 culture Repeat cultures on 3/4 revealed no growth thus far Dr. Baxter Flattery with infectious disease following Currently treating with Zyvox which will be continued as we monitor repeat culture for at least 72 hours Unlikely that possible Micrococcus bacteremia is contributing to worsening SIRS criteria  SIRS (systemic inflammatory response syndrome) ([place]) Evidence of worsening SIRS criteria with worsening tachycardia and leukocytosis No evidence of fevers Patient denies any focal symptoms that would suggest a source of infection although patient's ability to report symptoms is limited due to spinal injury/functional quadriplegia Will obtain urinalysis, chest x-ray, procalcitonin, CRP, lactic acid to perform a basic evaluation for any new sources of infection  Hypokalemia Replaced  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  Patient reports that his associated abdominal bloating and discomfort are also improving.  Patient denies any associated pain.  Patient denies shortness of breath or cough.  Physical Exam:  Vitals:   01/09/23 0920 01/09/23 1409 01/09/23 1821 01/09/23 2212  BP: 108/63 112/68 113/79 104/67  Pulse: (!) 112 95 (!) 101 99  Resp: '16 18 18 17  '$ Temp: 98.4 F (36.9 C) (!) 97.5 F (36.4 C) 97.9 F (36.6 C) 99 F (37.2 C)  TempSrc:      SpO2: 100% 93% 95% 95%  Weight:      Height:  Constitutional: Awake alert and oriented x3, no associated distress.   Skin: Significant scaly rash over the face with excoriations.  Unchanged compared to yesterday.   Eyes: Pupils are equally reactive to light.  No evidence of scleral icterus or conjunctival pallor.  ENMT: Moist mucous membranes noted.  Posterior pharynx clear of any exudate or lesions.   Respiratory: Scattered Bilaterally with mild bibasilar rales.  No evidence of wheezing.  Diminished breath sounds at the bases.  Normal respiratory effort. No accessory muscle use.  Cardiovascular: Tachycardic rate with regular rhythm no murmurs / rubs / gallops. No extremity edema. 2+ pedal pulses. No carotid bruits.  Abdomen: Abdomen is still somewhat protuberant but soft and nontender.  No evidence of intra-abdominal masses.  Positive bowel sounds noted in all quadrants.   Musculoskeletal: Notable contractures of the extremities with poor muscle tone.   Data Reviewed:  I have personally reviewed and interpreted labs, imaging.  Significant findings are   CBC: Recent Labs  Lab 01/03/23 0421 01/05/23 0858 01/06/23 0313 01/09/23 0412  WBC 12.1* 14.0* 16.7* 17.1*  NEUTROABS  --   --  11.9* 11.6*  HGB 11.4* 11.8* 11.9* 10.8*  HCT 36.7* 37.6* 38.4* 33.6*  MCV 86.8 85.8 85.1 83.6  PLT 224 321 343 [id number]   Basic Metabolic Panel: Recent Labs  Lab 01/05/23 0858 01/06/23 0313 01/07/23 0400 01/08/23 0350 01/09/23 0412  NA 134* 136 133* 134* 132*  K 3.7 3.8 3.6 3.6 3.8  CL 106 104 102 101 103  CO2 '24 25 25 25 23  '$ GLUCOSE 124* 120* 113* 116* 116*  BUN <5* <5* <5* <5* 8  CREATININE 0.40* 0.41* 0.45* 0.49* 0.35*  CALCIUM 7.4* 7.8* 7.5* 7.6* 7.4*  MG  --   --   --   --  1.5*   GFR: Estimated Creatinine Clearance: 129.6 mL/min (A) (by C-G formula based on SCr of 0.35 mg/dL (L)). Liver Function Tests: Recent Labs  Lab 01/09/23 0412  AST 14*  ALT 13  ALKPHOS 28*  BILITOT 0.4  PROT 4.7*  ALBUMIN 1.9*     Code Status:   Full code.  Code status decision has been confirmed with: patient    Severity of Illness:  The appropriate patient status for this patient is INPATIENT. Inpatient status is judged to be reasonable and necessary in order to provide the required intensity of service to ensure the patient's safety. The patient's presenting symptoms, physical exam findings, and initial radiographic and laboratory data in the context of their chronic comorbidities is felt to place them at high risk for further clinical deterioration. Furthermore, it is not anticipated that the patient will be medically stable for discharge from the hospital within 2 midnights of admission.   * I certify that at the point of admission it is my clinical judgment that the patient will require inpatient hospital care spanning beyond 2 midnights from the point of admission due to high intensity of service, high risk for further deterioration and high frequency of surveillance required.*  Time spent:  54 minutes  Author:  Vernelle Emerald MD  01/09/2023 10:12 PM

## 2023-01-09 NOTE — Progress Notes (Signed)
Calorie Count Note  48 hour calorie count ordered.  Diet: Soft Supplements: Ensure Plus High Protein BID, Boost Breeze once daily  Day 1 (3/6): Breakfast: didn't order a meal but had 2 saltine crackers and 75m of ginger ale Lunch: 0%, only ordered yogurt Dinner: 0%, didn't order a meal Supplements: 1 Ensure Plus High Protein  Total intake: 395 kcal (18% of minimum estimated needs)  20 protein (20% of minimum estimated needs)  Nutrition Dx: Inadequate oral intake related to altered GI function, poor appetite, nausea as evidenced by energy intake < 75% for > 7 days.   Goal: Patient will meet greater than or equal to 90% of their needs   Subjective:  Patient had minimal solid food yesterday. Had 1 Ensure. Tried a BColgate-Palmolivebut per RN report patient vomited afterwards. Per RN this morning, patient requesting a fruit platter but unable to order on soft diet.  Reached out to MD to inquire about advancing to a regular diet to avoid restricting patient's intake.   Intervention:  - Soft diet per MD.  Would recommend liberalizing to Regular to avoid restricting intake. - Ensure Plus High Protein po TID, each supplement provides 350 kcal and 20 grams of protein. - Encourage intake of small and frequent meals throughout the day in addition to supplements.    - 48 hour calorie count run through the end of today, 3/7.             - Please document % intake of all foods, drinks, and nutrition supplements patient consumes.              - If patient skips/refuses meals please document 0% for that meal.             - Discussed with RN.     - Daily multivitamin to support micronutrient needs. - Monitor weight trends.    ASamson FredericRD, LDN For contact information, refer to [facility]

## 2023-01-10 ENCOUNTER — Inpatient Hospital Stay (HOSPITAL_COMMUNITY): Payer: Medicaid Other

## 2023-01-10 DIAGNOSIS — U071 COVID-19: Secondary | ICD-10-CM | POA: Diagnosis not present

## 2023-01-10 DIAGNOSIS — R6510 Systemic inflammatory response syndrome (SIRS) of non-infectious origin without acute organ dysfunction: Secondary | ICD-10-CM | POA: Diagnosis not present

## 2023-01-10 DIAGNOSIS — K56609 Unspecified intestinal obstruction, unspecified as to partial versus complete obstruction: Secondary | ICD-10-CM | POA: Diagnosis not present

## 2023-01-10 DIAGNOSIS — C720 Malignant neoplasm of spinal cord: Secondary | ICD-10-CM | POA: Diagnosis not present

## 2023-01-10 DIAGNOSIS — L409 Psoriasis, unspecified: Secondary | ICD-10-CM | POA: Diagnosis not present

## 2023-01-10 DIAGNOSIS — K567 Ileus, unspecified: Secondary | ICD-10-CM | POA: Diagnosis not present

## 2023-01-10 DIAGNOSIS — K5989 Other specified functional intestinal disorders: Secondary | ICD-10-CM | POA: Diagnosis not present

## 2023-01-10 DIAGNOSIS — E43 Unspecified severe protein-calorie malnutrition: Secondary | ICD-10-CM | POA: Diagnosis not present

## 2023-01-10 DIAGNOSIS — E876 Hypokalemia: Secondary | ICD-10-CM | POA: Diagnosis not present

## 2023-01-10 DIAGNOSIS — R7881 Bacteremia: Secondary | ICD-10-CM | POA: Diagnosis not present

## 2023-01-10 LAB — COMPREHENSIVE METABOLIC PANEL
ALT: 16 U/L (ref 0–44)
AST: 15 U/L (ref 15–41)
Albumin: 2.2 g/dL — ABNORMAL LOW (ref 3.5–5.0)
Alkaline Phosphatase: 34 U/L — ABNORMAL LOW (ref 38–126)
Anion gap: 11 (ref 5–15)
BUN: <5 mg/dL — ABNORMAL LOW (ref 6–20)
CO2: 24 mmol/L (ref 22–32)
Calcium: 7.8 mg/dL — ABNORMAL LOW (ref 8.9–10.3)
Chloride: 101 mmol/L (ref 98–111)
Creatinine, Ser: 0.42 mg/dL — ABNORMAL LOW (ref 0.61–1.24)
GFR, Estimated: >60 mL/min (ref >60)
Glucose, Bld: 107 mg/dL — ABNORMAL HIGH (ref 70–99)
Potassium: 3.6 mmol/L (ref 3.5–5.1)
Sodium: 136 mmol/L (ref 135–145)
Total Bilirubin: 0.5 mg/dL (ref 0.3–1.2)
Total Protein: 5.2 g/dL — ABNORMAL LOW (ref 6.5–8.1)

## 2023-01-10 LAB — CBC WITH DIFFERENTIAL/PLATELET
Abs Immature Granulocytes: 0.42 10*3/uL — ABNORMAL HIGH (ref 0.00–0.07)
Basophils Absolute: 0.1 10*3/uL (ref 0.0–0.1)
Basophils Relative: 1 %
Eosinophils Absolute: 0.5 10*3/uL (ref 0.0–0.5)
Eosinophils Relative: 3 %
HCT: 34.9 % — ABNORMAL LOW (ref 39.0–52.0)
Hemoglobin: 11.2 g/dL — ABNORMAL LOW (ref 13.0–17.0)
Immature Granulocytes: 3 %
Lymphocytes Relative: 14 %
Lymphs Abs: 2.1 10*3/uL (ref 0.7–4.0)
MCH: 26.7 pg (ref 26.0–34.0)
MCHC: 32.1 g/dL (ref 30.0–36.0)
MCV: 83.1 fL (ref 80.0–100.0)
Monocytes Absolute: 1.8 10*3/uL — ABNORMAL HIGH (ref 0.1–1.0)
Monocytes Relative: 12 %
Neutro Abs: 10.3 10*3/uL — ABNORMAL HIGH (ref 1.7–7.7)
Neutrophils Relative %: 67 %
Platelets: 444 10*3/uL — ABNORMAL HIGH (ref 150–400)
RBC: 4.2 MIL/uL — ABNORMAL LOW (ref 4.22–5.81)
RDW: 13.2 % (ref 11.5–15.5)
WBC: 15.1 10*3/uL — ABNORMAL HIGH (ref 4.0–10.5)
nRBC: 0 % (ref 0.0–0.2)

## 2023-01-10 LAB — MAGNESIUM: Magnesium: 2.3 mg/dL (ref 1.7–2.4)

## 2023-01-10 LAB — C-REACTIVE PROTEIN: CRP: 5.5 mg/dL — ABNORMAL HIGH (ref <1.0)

## 2023-01-10 MED ORDER — FUROSEMIDE 10 MG/ML IJ SOLN
20.0000 mg | Freq: Once | INTRAMUSCULAR | Status: DC
  Filled 2023-01-10: qty 2

## 2023-01-10 MED ORDER — POLYETHYLENE GLYCOL 3350 17 G PO PACK
17.0000 g | PACK | Freq: Every day | ORAL | Status: DC
  Administered 2023-01-11: 17 g via ORAL
  Filled 2023-01-10 (×2): qty 1

## 2023-01-10 MED ORDER — SENNA 8.6 MG PO TABS
2.0000 | ORAL_TABLET | Freq: Every day | ORAL | Status: DC
  Administered 2023-01-10 – 2023-01-11 (×2): 17.2 mg via ORAL
  Filled 2023-01-10 (×3): qty 2

## 2023-01-10 MED ORDER — FLEET ENEMA 7-19 GM/118ML RE ENEM
1.0000 | ENEMA | Freq: Once | RECTAL | Status: AC
  Administered 2023-01-10: 1 via RECTAL
  Filled 2023-01-10: qty 1

## 2023-01-10 MED ORDER — FUROSEMIDE 40 MG PO TABS
40.0000 mg | ORAL_TABLET | Freq: Once | ORAL | Status: AC
  Administered 2023-01-10: 40 mg via ORAL
  Filled 2023-01-10: qty 1

## 2023-01-10 NOTE — Plan of Care (Signed)

## 2023-01-10 NOTE — Plan of Care (Signed)
  Problem: Education: Goal: Knowledge of General Education information will improve Description: Including pain rating scale, medication(s)/side effects and non-pharmacologic comfort measures Outcome: Progressing   Problem: Activity: Goal: Risk for activity intolerance will decrease Outcome: Progressing   Problem: Elimination: Goal: Will not experience complications related to bowel motility Outcome: Progressing   

## 2023-01-10 NOTE — Progress Notes (Signed)
.  Mobility Specialist - Progress Note   01/10/23 1001  Mobility  Activity  (Supine UE Exercises)  Range of Motion/Exercises Passive;Right arm  Activity Response Tolerated well  Mobility Referral Yes  $Mobility charge 1 Mobility   Pt received in bed and agreeable to do Supine UE workouts. Noticed pt R arm swelling from IV. Nurse & NT made aware. No complaints during session.    Supine BUE Exercises: 5 reps each,  yellow resistance band  1) Elbow Flexion  2) Elbow extension   Set designer

## 2023-01-10 NOTE — Assessment & Plan Note (Addendum)
Replaced. °

## 2023-01-10 NOTE — Progress Notes (Signed)
[facility] Infectious Diseases Follow Up Note  Patient Identification: Patient Name: Carlos [name] MRN: [id number] [place] Date: 12/28/2022 11:22 PM Age: 57 y.o.Today's Date: 01/10/2023  Reason for Visit: covid/micrococcus bacteremia   Principal Problem:   Generalized dysmotility of intestine Active Problems:   Leukocytosis   SBO (small bowel obstruction) (HCC)   Spinal cord ependymoma (HCC)   S/P laminectomy   Joint contracture of the upper arm   Contracture of hand   COVID-19 virus infection   Protein-calorie malnutrition, severe (HCC)   Bacteremia   Hypokalemia   Psoriasis   SIRS (systemic inflammatory response syndrome) (HCC)  Antibiotics:  Vancomycin 2/27-2/28 Zosyn 2/27-2/29 Linezolid 3/5-c  Lines/Hardwares:  Interval Events: remains afebrile, WBC downtrending  Assessment 57 year old male with history of spinal ependymoma  s/p resection and C3-3 7 laminectomies 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  CT with functional low-grade SBO with large amount of stool in the colon which was managed conservatively, evaluated by GI and general surgery.  Hospital course further complicated by [id number] positivity on 2/29 status posttreatment with 5-day course of Paxlovid. Also found to have micrococcus luteus/lylae bacteremia   Repeat blood cx 3/4 off abtx NG in 3 days  Fevers could be due to COVID 19 Clinically doing well with  no concerns for infection  Likely micrococcus luteus is contaminant  2/25 staph cohnii/epidermidis 1/2 sets is also likely a contaminant   Recommendations DC linezolid  ID will so, please call with questions/any changes  D/w ID pharm D   Rest of the management as per the primary team. Thank you for the consult. Please page with pertinent questions or  concerns.  ______________________________________________________________________ Subjective patient seen and examined at the bedside. He has no complaints. 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   Past Surgical History:  Procedure Laterality Date   I & D EXTREMITY Left 09/23/2019   Procedure: IRRIGATION AND DEBRIDEMENT OF  LEFT LONG FINGER, REVISION OF LEFT LONG FINGER AMPUTATION;  Surgeon: Verner Mould, MD;  Location: [facility];  Service: Orthopedics;  Laterality: Left;   partial amputation left 3rd finger Left 12/2018   Vitals BP 109/67 (BP Location: Left Arm)   Pulse 87   Temp 98.3 F (36.8 C)   Resp 17   Ht '6\' 4"'$  (1.93 m)   Wt 90.7 kg   SpO2 94%   BMI 24.34 kg/m      Physical Exam Constitutional: Lying in the bed and not in acute distress    Comments: Scaly rash in the face with some excoriations  Cardiovascular:     Rate and Rhythm: Normal rate and regular rhythm.     Heart sounds: s1s2  Pulmonary:     Effort: Pulmonary effort is normal on room air     Comments: Normal breath sounds   Abdominal:     Palpations: Abdomen is soft.     Tenderness: non tender, mild distension  Musculoskeletal:        General: No swelling or tenderness.   Skin:    Comments: No obvious rashes   Neurological:     General: contracted upper and lower extremities, functional quadriplegia   Psychiatric:        Mood and Affect: Mood normal.   Pertinent Microbiology Results for orders placed or performed during the hospital encounter of  12/28/22  Blood culture (routine x 2)     Status: Abnormal   Collection Time: 12/29/22  3:22 AM   Specimen: BLOOD RIGHT HAND  Result Value Ref Range Status   Specimen Description   Final    BLOOD RIGHT HAND BOTTLES DRAWN AEROBIC AND ANAEROBIC Performed at [facility], [place] [address],  [place], [place] [address]    Special Requests   Final    Blood Culture adequate volume Performed at [place] [address] [place], [place] [address]    Culture  Setup Time   Final    GRAM POSITIVE COCCI IN CLUSTERS IN BOTH AEROBIC AND ANAEROBIC BOTTLES CRITICAL RESULT CALLED TO, READ BACK BY AND VERIFIED WITH: [place] 12/30/22 @ 0009 BY AB    Culture (A)  Final    STAPHYLOCOCCUS COHNII STAPHYLOCOCCUS EPIDERMIDIS THE SIGNIFICANCE OF ISOLATING THIS ORGANISM FROM A SINGLE SET OF BLOOD CULTURES WHEN MULTIPLE SETS ARE DRAWN IS UNCERTAIN. PLEASE NOTIFY THE MICROBIOLOGY DEPARTMENT WITHIN ONE WEEK IF SPECIATION AND SENSITIVITIES ARE REQUIRED. Performed at [facility], [place] [address], [place], [place] [address]    Report Status 01/01/2023 FINAL  Final  Blood Culture ID Panel (Reflexed)     Status: Abnormal   Collection Time: 12/29/22  3:22 AM  Result Value Ref Range Status   Enterococcus faecalis NOT DETECTED NOT DETECTED Final   Enterococcus Faecium NOT DETECTED NOT DETECTED Final   Listeria monocytogenes NOT DETECTED NOT DETECTED Final   Staphylococcus species DETECTED (A) NOT DETECTED Final    Comment: CRITICAL RESULT CALLED TO, READ BACK BY AND VERIFIED WITH: PHARMD M. LILLISTON 12/30/22 @ 0009 BY AB    Staphylococcus aureus (BCID) NOT DETECTED NOT DETECTED Final   Staphylococcus epidermidis DETECTED (A) NOT DETECTED Final    Comment: CRITICAL RESULT CALLED TO, READ BACK BY AND VERIFIED WITH: PHARMD M. LILLISTON 12/30/22 @ 0009 BY AB    Staphylococcus lugdunensis NOT DETECTED NOT DETECTED Final   Streptococcus species NOT DETECTED NOT DETECTED Final   Streptococcus agalactiae NOT DETECTED NOT DETECTED Final   Streptococcus pneumoniae NOT DETECTED NOT DETECTED Final   Streptococcus pyogenes NOT DETECTED NOT DETECTED Final   A.calcoaceticus-baumannii NOT DETECTED NOT DETECTED Final   Bacteroides fragilis NOT DETECTED NOT DETECTED Final    Enterobacterales NOT DETECTED NOT DETECTED Final   Enterobacter cloacae complex NOT DETECTED NOT DETECTED Final   Escherichia coli NOT DETECTED NOT DETECTED Final   Klebsiella aerogenes NOT DETECTED NOT DETECTED Final   Klebsiella oxytoca NOT DETECTED NOT DETECTED Final   Klebsiella pneumoniae NOT DETECTED NOT DETECTED Final   Proteus species NOT DETECTED NOT DETECTED Final   Salmonella species NOT DETECTED NOT DETECTED Final   Serratia marcescens NOT DETECTED NOT DETECTED Final   Haemophilus influenzae NOT DETECTED NOT DETECTED Final   Neisseria meningitidis NOT DETECTED NOT DETECTED Final   Pseudomonas aeruginosa NOT DETECTED NOT DETECTED Final   Stenotrophomonas maltophilia NOT DETECTED NOT DETECTED Final   Candida albicans NOT DETECTED NOT DETECTED Final   Candida auris NOT DETECTED NOT DETECTED Final   Candida glabrata NOT DETECTED NOT DETECTED Final   Candida krusei NOT DETECTED NOT DETECTED Final   Candida parapsilosis NOT DETECTED NOT DETECTED Final   Candida tropicalis NOT DETECTED NOT DETECTED Final   Cryptococcus neoformans/gattii NOT DETECTED NOT DETECTED Final   Methicillin resistance mecA/C NOT DETECTED NOT DETECTED Final    Comment: Performed at [facility], [place]. [place],  [place] [address]  Blood culture (routine x 2)     Status: None   Collection Time: 12/29/22  3:33 AM   Specimen: Right Antecubital; Blood  Result Value Ref Range Status   Specimen Description RIGHT ANTECUBITAL BLOOD  Final   Special Requests   Final    Blood Culture results may not be optimal due to an inadequate volume of blood received in culture bottles BOTTLES DRAWN AEROBIC AND ANAEROBIC   Culture   Final    NO GROWTH 5 DAYS Performed at [facility], [place] [address], [place], [place] [address]    Report Status 01/03/2023 FINAL  Final  Culture, blood (Routine X 2) w Reflex to ID Panel     Status: Abnormal   Collection Time: 12/31/22  5:43 PM   Specimen: BLOOD RIGHT ARM   Result Value Ref Range Status   Specimen Description   Final    BLOOD RIGHT ARM Performed at [facility], [place] [address], [place], [place] [address]    Special Requests   Final    BOTTLES DRAWN AEROBIC ONLY Blood Culture adequate volume Performed at [place] [address], [place], [place] [address]    Culture  Setup Time   Final    GRAM POSITIVE COCCI IN CLUSTERS AEROBIC BOTTLE ONLY CRITICAL RESULT CALLED TO, READ BACK BY AND VERIFIED WITH: PHARMD ABBIE E. [id number] FCP    Culture (A)  Final    MICROCOCCUS LUTEUS/LYLAE Standardized susceptibility testing for this organism is not available. Performed at [facility], [place] [address], [place], [place] [address]    Report Status 01/03/2023 FINAL  Final  Culture, blood (Routine X 2) w Reflex to ID Panel     Status: Abnormal   Collection Time: 12/31/22  5:43 PM   Specimen: BLOOD  Result Value Ref Range Status   Specimen Description   Final    BLOOD BLOOD RIGHT HAND AEROBIC BOTTLE ONLY Performed at [place] [address] [place], [place] [address]    Special Requests   Final    BOTTLES DRAWN AEROBIC ONLY Blood Culture adequate volume Performed at [place] [address], [place], [place] [address]    Culture  Setup Time   Final    GRAM POSITIVE COCCI IN CLUSTERS AEROBIC BOTTLE ONLY CRITICAL VALUE NOTED.  VALUE IS CONSISTENT WITH PREVIOUSLY REPORTED AND CALLED VALUE.    Culture (A)  Final    MICROCOCCUS LUTEUS/LYLAE Standardized susceptibility testing for this organism is not available. Performed at [facility], [place] [address] [place], [place] [address]    Report Status 01/06/2023 FINAL  Final  Resp panel by RT-PCR (RSV, Flu A&B, Covid) Anterior Nasal Swab     Status: Abnormal   Collection Time: 01/02/23  4:02 PM   Specimen: Anterior Nasal Swab  Result Value Ref Range Status   SARS Coronavirus 2 by RT PCR POSITIVE (A) NEGATIVE  Final    Comment: (NOTE) SARS-CoV-2 target nucleic acids are DETECTED.  The SARS-CoV-2 RNA is generally detectable in upper respiratory specimens during the acute phase of infection. Positive results are indicative of the presence of the identified virus, but do not rule out bacterial infection or co-infection with other pathogens not detected by the test. Clinical correlation with patient history and other diagnostic information is necessary to determine patient infection status. The expected result is Negative.  Fact Sheet for Patients: EntrepreneurPulse.com.au  Fact Sheet for Healthcare  Providers: IncredibleEmployment.be  This test is not yet approved or cleared by the [place] and  has been authorized for detection and/or diagnosis of SARS-CoV-2 by FDA under an Emergency Use Authorization (EUA).  This EUA will remain in effect (meaning this test can be used) for the duration of  the COVID-19 declaration under Section 564(b)(1) of the A ct, 21 U.S.C. section 360bbb-3(b)(1), unless the authorization is terminated or revoked sooner.     Influenza A by PCR NEGATIVE NEGATIVE Final   Influenza B by PCR NEGATIVE NEGATIVE Final    Comment: (NOTE) The Xpert Xpress SARS-CoV-2/FLU/RSV plus assay is intended as an aid in the diagnosis of influenza from Nasopharyngeal swab specimens and should not be used as a sole basis for treatment. Nasal washings and aspirates are unacceptable for Xpert Xpress SARS-CoV-2/FLU/RSV testing.  Fact Sheet for Patients: EntrepreneurPulse.com.au  Fact Sheet for Healthcare Providers: IncredibleEmployment.be  This test is not yet approved or cleared by the [place] FDA and has been authorized for detection and/or diagnosis of SARS-CoV-2 by FDA under an Emergency Use Authorization (EUA). This EUA will remain in effect (meaning this test can be used) for the duration of  the COVID-19 declaration under Section 564(b)(1) of the Act, 21 U.S.C. section 360bbb-3(b)(1), unless the authorization is terminated or revoked.     Resp Syncytial Virus by PCR NEGATIVE NEGATIVE Final    Comment: (NOTE) Fact Sheet for Patients: EntrepreneurPulse.com.au  Fact Sheet for Healthcare Providers: IncredibleEmployment.be  This test is not yet approved or cleared by the [place] FDA and has been authorized for detection and/or diagnosis of SARS-CoV-2 by FDA under an Emergency Use Authorization (EUA). This EUA will remain in effect (meaning this test can be used) for the duration of the COVID-19 declaration under Section 564(b)(1) of the Act, 21 U.S.C. section 360bbb-3(b)(1), unless the authorization is terminated or revoked.  Performed at [facility], [place] [address], [place], [place] [address]   Respiratory (~20 pathogens) panel by PCR     Status: None   Collection Time: 01/02/23  4:02 PM   Specimen: Nasopharyngeal Swab; Respiratory  Result Value Ref Range Status   Adenovirus NOT DETECTED NOT DETECTED Final   Coronavirus 229E NOT DETECTED NOT DETECTED Final    Comment: (NOTE) The Coronavirus on the Respiratory Panel, DOES NOT test for the novel  Coronavirus (2019 nCoV)    Coronavirus HKU1 NOT DETECTED NOT DETECTED Final   Coronavirus NL63 NOT DETECTED NOT DETECTED Final   Coronavirus OC43 NOT DETECTED NOT DETECTED Final   Metapneumovirus NOT DETECTED NOT DETECTED Final   Rhinovirus / Enterovirus NOT DETECTED NOT DETECTED Final   Influenza A NOT DETECTED NOT DETECTED Final   Influenza B NOT DETECTED NOT DETECTED Final   Parainfluenza Virus 1 NOT DETECTED NOT DETECTED Final   Parainfluenza Virus 2 NOT DETECTED NOT DETECTED Final   Parainfluenza Virus 3 NOT DETECTED NOT DETECTED Final   Parainfluenza Virus 4 NOT DETECTED NOT DETECTED Final   Respiratory Syncytial Virus NOT DETECTED NOT DETECTED Final    Bordetella pertussis NOT DETECTED NOT DETECTED Final   Bordetella Parapertussis NOT DETECTED NOT DETECTED Final   Chlamydophila pneumoniae NOT DETECTED NOT DETECTED Final   Mycoplasma pneumoniae NOT DETECTED NOT DETECTED Final    Comment: Performed at [facility], [place]. [address], [place], [place] [address]  Culture, blood (Routine X 2) w Reflex to ID Panel     Status: None (Preliminary result)   Collection Time: 01/06/23  3:13 AM   Specimen: BLOOD  Result Value Ref Range Status   Specimen Description   Final    BLOOD BLOOD RIGHT HAND Performed at [place] [address] [place], [place] [address]    Special Requests   Final    BOTTLES DRAWN AEROBIC ONLY Blood Culture adequate volume Performed at [place] [address], [place], [place] [address]    Culture   Final    NO GROWTH 4 DAYS Performed at [facility], [place] [address] [place], [place] [address]    Report Status PENDING  Incomplete  Culture, blood (Routine X 2) w Reflex to ID Panel     Status: None (Preliminary result)   Collection Time: 01/06/23  3:16 AM   Specimen: BLOOD  Result Value Ref Range Status   Specimen Description   Final    BLOOD BLOOD LEFT HAND Performed at [place] [address], [place], [place] [address]    Special Requests   Final    BOTTLES DRAWN AEROBIC ONLY Blood Culture adequate volume Performed at [place] [address], [place], [place] [address]    Culture   Final    NO GROWTH 4 DAYS Performed at [facility], [place] [address] [place],  [address]    Report Status PENDING  Incomplete    Pertinent Lab.    Latest Ref Rng & Units 01/10/2023    4:11 AM 01/09/2023    4:12 AM 01/06/2023    3:13 AM  CBC  WBC 4.0 - 10.5 K/uL 15.1  17.1  16.7   Hemoglobin 13.0 - 17.0 g/dL 11.2  10.8  11.9   Hematocrit 39.0 - 52.0 % 34.9  33.6  38.4   Platelets 150 - 400 K/uL 444  396  343        Latest Ref Rng & Units 01/10/2023    4:11 AM 01/09/2023    4:12 AM 01/08/2023    3:50 AM  CMP  Glucose 70 - 99 mg/dL 107  116  116   BUN 6 - 20 mg/dL <5  8  <5   Creatinine 0.61 - 1.24 mg/dL 0.42  0.35  0.49   Sodium 135 - 145 mmol/L 136  132  134   Potassium 3.5 - 5.1 mmol/L 3.6  3.8  3.6   Chloride 98 - 111 mmol/L 101  103  101   CO2 22 - 32 mmol/L '24  23  25   '$ Calcium 8.9 - 10.3 mg/dL 7.8  7.4  7.6   Total Protein 6.5 - 8.1 g/dL 5.2  4.7    Total Bilirubin 0.3 - 1.2 mg/dL 0.5  0.4    Alkaline Phos 38 - 126 U/L 34  28    AST 15 - 41 U/L 15  14    ALT 0 - 44 U/L 16  13       Pertinent Imaging today Plain films and CT images have been personally visualized and interpreted; radiology reports have been reviewed. Decision making incorporated into the Impression / Recommendations.  DG Chest 1 View  Result Date: 01/09/2023 CLINICAL DATA:  Pneumonia EXAM: CHEST  1 VIEW COMPARISON:  12/29/2022 FINDINGS: Mild perihilar edema is possible, although equivocal. Layering moderate bilateral pleural effusions. Associated mild bibasilar atelectasis. No pneumothorax. The heart is normal in size. IMPRESSION: Layering moderate bilateral pleural effusions. Possible mild perihilar edema, equivocal. Electronically Signed   By: Henderson Newcomer.D.  On: 01/09/2023 22:38   DG Abd Portable 1V  Result Date: 01/07/2023 CLINICAL DATA:  Abdominal distention, vomiting EXAM: PORTABLE ABDOMEN - 1 VIEW COMPARISON:  01/01/2023 FINDINGS: There is interval removal of enteric tube. Stomach is distended. There are few slightly dilated gas-filled small bowel loops in upper mid abdomen. Gas and stool are present in colon. IMPRESSION: Mild gaseous dilation of small bowel loops may suggest ileus. There is moderate gaseous distention of stomach. Electronically Signed   By: Elmer Picker M.D.   On: 01/07/2023 08:13   DG Abd Portable 1V  Result Date: 01/01/2023 CLINICAL DATA:  Small bowel obstruction. EXAM: PORTABLE  ABDOMEN - 1 VIEW COMPARISON:  December 31, 2022. FINDINGS: Nasogastric tube tip is seen in proximal stomach. No abnormal bowel dilatation is noted. Moderate amount of stool is seen throughout the colon. IMPRESSION: No abnormal bowel dilatation. Electronically Signed   By: Marijo Conception M.D.   On: 01/01/2023 08:18   CT ABDOMEN PELVIS W CONTRAST  Result Date: 12/31/2022 CLINICAL DATA:  Abdominal pain and possible small bowel obstruction on previous CT. EXAM: CT ABDOMEN AND PELVIS WITH CONTRAST TECHNIQUE: Multidetector CT imaging of the abdomen and pelvis was performed using the standard protocol following bolus administration of intravenous contrast. RADIATION DOSE REDUCTION: This exam was performed according to the departmental dose-optimization program which includes automated exposure control, adjustment of the mA and/or kV according to patient size and/or use of iterative reconstruction technique. CONTRAST:  166m OMNIPAQUE IOHEXOL 300 MG/ML  SOLN COMPARISON:  12/29/2022 FINDINGS: Lower chest: Heart is normal size. Small amount of bilateral pleural fluid with associated bibasilar atelectasis as infection is less likely. These findings are worse compared to the recent prior exam. Hepatobiliary: Gallbladder is somewhat contracted. Liver and biliary tree are normal. Pancreas: Normal. Spleen: Normal. Adrenals/Urinary Tract: Adrenal glands are normal. Kidneys are normal in size without hydronephrosis or nephrolithiasis. Ureters and bladder are normal. Stomach/Bowel: Nasogastric tube has tip over the body of the stomach in the left upper quadrant. Interval decompression of the small bowel is normal in caliber as there is been interval decompression of the previously seen dilated small bowel loops. Mild fecal retention over the rectum with mild associated wall thickening which could be seen with stercoral colitis. The sigmoid colon is redundant as there is new narrowing of a segment of sigmoid colon over the  midline lower abdomen. This is not appear to be obstructive. Mild fecal retention throughout the remainder of the colon. No evidence of vascular compromise. No free peritoneal air. Vascular/Lymphatic: Aorta is normal caliber. No evidence of adenopathy. Remaining vascular structures are unremarkable. Reproductive: Normal. Other: Minimal air within the subcutaneous fat of the abdominal wall likely due to recent intervention. Right inguinal hernia containing only peritoneal fat unchanged. Musculoskeletal: No focal abnormality. IMPRESSION: 1. Nasogastric tube present with interval decompression of the previously seen dilated small bowel loops. 2. Mild fecal retention over the rectum with mild associated wall thickening which could be seen with stercoral colitis. The sigmoid colon is redundant as there is new narrowing of a segment of sigmoid colon over the midline lower abdomen which does not appear to be obstructive. No definite volvulus or evidence of vascular compromise. 3. Small amount of bilateral pleural fluid with associated bibasilar atelectasis as infection is less likely. These findings are worse compared to the recent prior exam. 4. Right inguinal hernia containing only peritoneal fat unchanged. Electronically Signed   By: DMarin OlpM.D.   On: 12/31/2022 21:58   DG  Abd Portable 1V  Result Date: 12/31/2022 CLINICAL DATA:  Small bowel obstruction EXAM: PORTABLE ABDOMEN - 1 VIEW COMPARISON:  Yesterday FINDINGS: Enteric tube with tip at the stomach where there is enteric contrast. Diffuse gaseous distension of colon and somewhat affecting small bowel as well. No concerning mass effect or gas collection. IMPRESSION: Unchanged gas dilated bowel. Electronically Signed   By: Jorje Guild M.D.   On: 12/31/2022 07:52   DG Abd Portable 1V-Small Bowel Obstruction Protocol-initial, 8 hr delay  Result Date: 12/30/2022 CLINICAL DATA:  SBO protocol.  8 hour delay image EXAM: PORTABLE ABDOMEN - 1 VIEW  COMPARISON:  Radiograph 12/30/2022 at 7:14 a.m. FINDINGS: Persistent small bowel dilation similar to radiographs earlier today. Normal caliber colon with moderate stool burden. Enteric tube tip in the stomach with side port at the GE junction. Enteric contrast within the gastric fundus. No enteric contrast is seen beyond the stomach. IMPRESSION: Persistent small bowel obstruction. Enteric contrast has not progressed beyond the stomach. Electronically Signed   By: Placido Sou M.D.   On: 12/30/2022 20:45   DG Abd 1 View  Result Date: 12/30/2022 CLINICAL DATA:  [id number] Small bowel obstruction ([place]) [id number] EXAM: ABDOMEN - 1 VIEW COMPARISON:  CT 12/29/2022 FINDINGS: There is persistent small bowel dilation with normal caliber colon. Nasogastric tube side port overlies the region of the GE junction, tip overlies the stomach. The stomach is distended. Moderate stool burden in the colon. IMPRESSION: Persistent small bowel obstruction. Nasogastric tube side port overlies the region of the GE junction, recommend advancement by 6.0 cm. Electronically Signed   By: Maurine Simmering M.D.   On: 12/30/2022 07:59   DG Abdomen 1 View  Result Date: 12/29/2022 CLINICAL DATA:  57 year old male status post nasogastric tube placement. EXAM: ABDOMEN - 1 VIEW COMPARISON:  Abdominal radiograph 12/29/2022. FINDINGS: Nasogastric tube has been advanced, now with the side port proximally 5.5 cm distal to the gastroesophageal junction. Persistent distention of the stomach is noted in the visualized upper abdomen. Abdomen is partially imaged. Areas of atelectasis and/or scarring noted in the lung bases. IMPRESSION: Nasogastric tube appears appropriately located. Electronically Signed   By: Vinnie Langton M.D.   On: 12/29/2022 06:02   DG Abdomen 1 View  Result Date: 12/29/2022 CLINICAL DATA:  57 year old male status post nasogastric tube placement. EXAM: ABDOMEN - 1 VIEW COMPARISON:  Chest x-ray 12/29/2022. FINDINGS: Nasogastric tube  tip is in the proximal stomach, with side port in the distal esophagus. Irregular opacities are noted in the lung bases bilaterally, likely to reflect areas of scarring and/or atelectasis. Abdomen is incompletely imaged, but visualized upper abdomen demonstrates gaseous distention of the stomach. IMPRESSION: 1. Nasogastric tube tip is in the proximal stomach, but side port remains in the distal esophagus. The tube should be advanced at least 10 cm for more optimal placement. Electronically Signed   By: Vinnie Langton M.D.   On: 12/29/2022 05:30   CT ABDOMEN PELVIS W CONTRAST  Addendum Date: 12/29/2022   ADDENDUM REPORT: 12/29/2022 03:46 ADDENDUM: Jejunal transition point best visualized on series 2, image 50. This may indicate a low-grade or functional small-bowel obstruction. The distal small bowel is decompressed. Large amount of stool in the colon. Electronically Signed   By: Ulyses Jarred M.D.   On: 12/29/2022 03:46   Result Date: 12/29/2022 CLINICAL DATA:  Small-bowel obstruction EXAM: CT ABDOMEN AND PELVIS WITH CONTRAST TECHNIQUE: Multidetector CT imaging of the abdomen and pelvis was performed using the standard protocol following bolus administration  of intravenous contrast. RADIATION DOSE REDUCTION: This exam was performed according to the departmental dose-optimization program which includes automated exposure control, adjustment of the mA and/or kV according to patient size and/or use of iterative reconstruction technique. CONTRAST:  127m OMNIPAQUE IOHEXOL 300 MG/ML  SOLN COMPARISON:  None Available. FINDINGS: Lower Chest: Normal. Hepatobiliary: Normal hepatic contours. No intra- or extrahepatic biliary dilatation. The gallbladder is normal. Pancreas: Normal pancreas. No ductal dilatation or peripancreatic fluid collection. Spleen: Normal. Adrenals/Urinary Tract: The adrenal glands are normal. No hydronephrosis, nephroureterolithiasis or solid renal mass. The urinary bladder is normal for degree  of distention Stomach/Bowel: Distended and fluid-filled stomach with fluid extending into the distal thoracic esophagus. Mildly dilated duodenum and proximal jejunum. Gradual transition in the mid abdomen. No focal colonic abnormality. Normal appendix. Vascular/Lymphatic: Normal course and caliber of the major abdominal vessels. No abdominal or pelvic lymphadenopathy. Reproductive: Normal prostate size with symmetric seminal vesicles. Other: None. Musculoskeletal: No bony spinal canal stenosis or focal osseous abnormality. IMPRESSION: 1. Distended and fluid-filled stomach with fluid extending into the distal thoracic esophagus. 2. Mildly dilated duodenum and proximal jejunum with gradual transition point in the mid abdomen. Electronically Signed: By: KUlyses JarredM.D. On: 12/29/2022 03:18   DG Abdomen Acute W/Chest  Result Date: 12/29/2022 CLINICAL DATA:  Shortness of breath and abdominal distention EXAM: DG ABDOMEN ACUTE WITH 1 VIEW CHEST COMPARISON:  None Available. FINDINGS: Atelectasis or scarring in the right hilum and lower lung. Hyperinflation of the left lung. No pleural effusion or pneumothorax. Normal cardiomediastinal silhouette. No acute osseous abnormality. Marked gaseous distention of the stomach. Gaseous dilation of multiple loops of small bowel in the central abdomen measuring up to 6.3 cm in diameter. Gas-filled colon without definite dilation. IMPRESSION: Marked gaseous dilation of the stomach and small bowel. CT is recommended to evaluate for obstruction. Atelectasis or scarring in the right mid and lower lung. Hyperinflation of the left lung. Electronically Signed   By: TPlacido SouM.D.   On: 12/29/2022 01:44    I spent 55 minutes for this patient encounter including review of prior medical records, coordination of care with primary/other specialist with greater than 50% of time being face to face/counseling and discussing diagnostics/treatment plan with the  patient/family.  Electronically signed by:   SRosiland Oz MD Infectious Disease Physician [facility] Pager: [phone]

## 2023-01-10 NOTE — Progress Notes (Signed)
PROGRESS NOTE   Carlos [name]  [id number] DOB: [birth year] DOA: 12/28/2022 PCP: Nicholes Rough, PA-C   Date of Service: the patient was seen and examined on 01/10/2023  Brief Narrative:  57 year old male with history of spinal ependymoma status post surgery, psoriasis,bed bound  who presented with 5-day history of abdominal pain,  On admission, CT scan showed functional low-grade SBO with large amount of stool in the colon.  Hospitalist group was contacted and patient was admitted to the hospital.    General surgery was consulted as well as gastroenterology.  NG tube was temporarily placed which has subsequently been removed on 3/1 with return of bowel function.  Hospital course complicated by [id number] infection with positivity on 2/29.  Patient was treated with a 5-day course of Paxlovid from 3/1 until 3/5.  Concerning the etiology of the ileus/low-grade bowel obstruction, GI feels that this is secondary to GI hypomotility/dysmotility.  They recommend gastroscintigraphy in the outpatient setting after discharge.  Tenuous oral intake throughout the hospitalization prompted a calorie count with the assistance of nutrition.    Assessment and Plan: * Generalized dysmotility of intestine Patient felt to be suffering from intestinal dysmotility secondary to history of spinal ependymoma by both surgery and gastroenterology Patient reports continued improvement in oral intake Actively monitoring oral intake including calorie count  After admission, GI recommending outpatient gastroscintigraphy -will need to arrange for outpatient follow-up upon discharge. Encouraging oral intake Nutrition assisting with calorie count to ensure caloric needs are being met Continue daily bowel regimen If patient fails to consume sufficient calories we may need to resort to alternative sources of nutrition.  SBO (small bowel obstruction) (HCC) See assessment and plan above  Protein-calorie malnutrition, severe  ([place]) Concern for progression of severe protein calorie malnutrition due to poor oral intake due to ongoing dysmotility Oral intake reportedly slowly improving. Undergoing calorie count with the assistance of nutrition which is appreciated Encouraging oral intake Ensure supplements If oral intake fails to significantly improve we may need to resort to alternative sources of nutrition.  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  Leukocytosis slightly improved.   Repeat procalcitonin, lactic acidosis, chest x-ray and urinalysis performed on 3/7 unremarkable.   No evidence of new sources of infection at this time. 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     Subjective:  Patient states that his appetite is continuing to improve.  Patient reports that his associated abdominal bloating and discomfort are also improving.   Physical Exam:  Vitals:   01/09/23 2212 01/10/23 0142 01/10/23 0621 01/10/23 1341  BP: 104/67 94/70 109/67 108/72  Pulse: 99 100 87 95  Resp: '17 17 17 16  '$ Temp: 99 F (37.2  C) 98.5 F (36.9 C) 98.3 F (36.8 C) (!) 97.4 F (36.3 C)  TempSrc:  SpO2: 95% 96% 94% 97%  Weight:      Height:        Constitutional: Awake alert and oriented x3, no associated distress.   Skin: Significant scaly rash over the face with excoriations.  Seemingly improving.     Eyes: Pupils are equally reactive to light.  No evidence of scleral icterus or conjunctival pallor.  ENMT: Moist mucous membranes noted.  Posterior pharynx clear of any exudate or lesions.   Respiratory: Mild basilar rales.  No evidence of wheezing.  Diminished breath sounds at the bases.  Normal respiratory effort. No accessory muscle use.  Cardiovascular: Regular rate and rhythm, no murmurs / rubs / gallops. No extremity edema. 2+ pedal pulses. No carotid bruits.  Abdomen: Abdomen is still somewhat protuberant but soft and nontender.  No evidence of intra-abdominal masses.  Positive bowel sounds noted in all quadrants.   Musculoskeletal: Notable contractures of the extremities, particularly the left upper extremity, with poor muscle tone.   Data Reviewed:  I have personally reviewed and interpreted labs, imaging.  Significant findings are   CBC: Recent Labs  Lab 01/05/23 0858 01/06/23 0313 01/09/23 0412 01/10/23 0411  WBC 14.0* 16.7* 17.1* 15.1*  NEUTROABS  --  11.9* 11.6* 10.3*  HGB 11.8* 11.9* 10.8* 11.2*  HCT 37.6* 38.4* 33.6* 34.9*  MCV 85.8 85.1 83.6 83.1  PLT 321 343 396 [id number]*   Basic Metabolic Panel: Recent Labs  Lab 01/06/23 0313 01/07/23 0400 01/08/23 0350 01/09/23 0412 01/10/23 0411  NA 136 133* 134* 132* 136  K 3.8 3.6 3.6 3.8 3.6  CL 104 102 101 103 101  CO2 '25 25 25 23 24  '$ GLUCOSE 120* 113* 116* 116* 107*  BUN <5* <5* <5* 8 <5*  CREATININE 0.41* 0.45* 0.49* 0.35* 0.42*  CALCIUM 7.8* 7.5* 7.6* 7.4* 7.8*  MG  --   --   --  1.5* 2.3   GFR: Estimated Creatinine Clearance: 129.6 mL/min (A) (by C-G formula based on SCr of 0.42 mg/dL (L)). Liver Function Tests: Recent  Labs  Lab 01/09/23 0412 01/10/23 0411  AST 14* 15  ALT 13 16  ALKPHOS 28* 34*  BILITOT 0.4 0.5  PROT 4.7* 5.2*  ALBUMIN 1.9* 2.2*     Code Status:  Full code.  Code status decision has been confirmed with: patient    Severity of Illness:  The appropriate patient status for this patient is INPATIENT. Inpatient status is judged to be reasonable and necessary in order to provide the required intensity of service to ensure the patient's safety. The patient's presenting symptoms, physical exam findings, and initial radiographic and laboratory data in the context of their chronic comorbidities is felt to place them at high risk for further clinical deterioration. Furthermore, it is not anticipated that the patient will be medically stable for discharge from the hospital within 2 midnights of admission.   * I certify that at the point of admission it is my clinical judgment that the patient will require inpatient hospital care spanning beyond 2 midnights from the point of admission due to high intensity of service, high risk for further deterioration and high frequency of surveillance required.*  Time spent:  50 minutes  Author:  Vernelle Emerald MD  01/10/2023 8:46 PM

## 2023-01-10 NOTE — Progress Notes (Signed)
Nutrition Follow-up  DOCUMENTATION CODES:   Not applicable   CALORIE COUNT RESULTS: Day 1 (3/6): Total intake: 395 kcal (18% of minimum estimated needs)  20 protein (20% of minimum estimated needs)  Day 2 (3/7): Total intake: 850 kcal (38% of minimum estimated needs)  41 protein (41% of minimum estimated needs)   INTERVENTION:  - Regular diet.  - Ensure Plus High Protein po TID, each supplement provides 350 kcal and 20 grams of protein. - Encourage intake of small and frequent meals throughout the day in addition to supplements.  - Continue daily multivitamin to support micronutrient needs. - Monitor weight trends.   - Per discussion with attending Dr. Cyd Silence, if intake does not improve over the weekend patient may need TPN.   NUTRITION DIAGNOSIS:   Inadequate oral intake related to altered GI function, poor appetite, nausea as evidenced by energy intake < 75% for > 7 days. *ongoing  GOAL:   Patient will meet greater than or equal to 90% of their needs *unmet   MONITOR:   PO intake, Supplement acceptance, Diet advancement, Weight trends  REASON FOR ASSESSMENT:   Consult Calorie Count  ASSESSMENT:   57 y.o. male with past medical history of spinal ependymoma status post surgery, bedbound, admitted on 12/28/2022 with abdominal pain, nausea and vomiting. Concern for SBO versus motility disorder.  Calorie count complete with results below:   Day 1 (3/6): Breakfast: didn't order a meal but had 2 saltine crackers and 72m of ginger ale Lunch: 0%, only ordered yogurt Dinner: 0%, didn't order a meal Supplements: 1 Ensure Plus High Protein Total intake: 395 kcal (18% of minimum estimated needs)  20 protein (20% of minimum estimated needs)  Day 2 (3/7): Breakfast: 0% Lunch: 1 banana, 1 fruit cocktail cup Dinner: 0%, didn't order a meal Supplements: 2 Ensure Plus High Protein Total intake: 850 kcal (38% of minimum estimated needs)  41 protein (41% of  minimum estimated needs)   Met with patient at bedside. He reports that day by day he is feeling better and his appetite is very so slowly improving.   Had some of his first solid food yesterday, which was a cup of fruit cocktail and a banana. He also reports having had 2 Ensure.  Patient very excited his diet was advanced to Regular (this AM) as he hopes he can have more of the foods he is craving. Would like a fruit platter for lunch, ordered per pt request.  Encouraged patient to continue to increase intake as tolerated. Encouraged ordering something at all 3 meals to stimulate appetite and to continue to drink Ensure as much as able.   Discussed results of calorie count with attending Dr. SCyd Silence  Given poor results of calorie count (<50%) discussed patient would likely benefit from nutrition support. As patient with gastric dysmotility, Dr. SCyd Silencereports GI would likely recommend against enteral nutrition (tube feeds). He plans to reach out to them.  If this is not an option, TPN may be needed.   Per discussion, plan at this time to allow patient through the weekend to work on increasing intake and if intake remains poor/the same patient will likely need TPN.   Medications reviewed and include: Dulcolax, MVI, Senokot  Labs reviewed:  -    Diet Order:   Diet Order             Diet regular Fluid consistency: Thin  Diet effective now  EDUCATION NEEDS:  Education needs have been addressed  Skin:  Skin Assessment: Reviewed RN Assessment  Last BM:  3/6  Height:  Ht Readings from Last 1 Encounters:  12/28/22 '6\' 4"'$  (1.93 m)   Weight: Wt Readings from Last 1 Encounters:  12/28/22 90.7 kg    BMI:  Body mass index is 24.34 kg/m.  Estimated Nutritional Needs:  Kcal:  2250-2450 kcals Protein:  100-115 grams Fluid:  >/= 2.2L    Samson Frederic RD, LDN For contact information, refer to [facility].

## 2023-01-11 DIAGNOSIS — U071 COVID-19: Secondary | ICD-10-CM | POA: Diagnosis not present

## 2023-01-11 DIAGNOSIS — E43 Unspecified severe protein-calorie malnutrition: Secondary | ICD-10-CM | POA: Diagnosis not present

## 2023-01-11 DIAGNOSIS — K5989 Other specified functional intestinal disorders: Secondary | ICD-10-CM | POA: Diagnosis not present

## 2023-01-11 DIAGNOSIS — K56609 Unspecified intestinal obstruction, unspecified as to partial versus complete obstruction: Secondary | ICD-10-CM | POA: Diagnosis not present

## 2023-01-11 DIAGNOSIS — R7881 Bacteremia: Secondary | ICD-10-CM | POA: Diagnosis not present

## 2023-01-11 DIAGNOSIS — E876 Hypokalemia: Secondary | ICD-10-CM | POA: Diagnosis not present

## 2023-01-11 DIAGNOSIS — C720 Malignant neoplasm of spinal cord: Secondary | ICD-10-CM | POA: Diagnosis not present

## 2023-01-11 DIAGNOSIS — R6510 Systemic inflammatory response syndrome (SIRS) of non-infectious origin without acute organ dysfunction: Secondary | ICD-10-CM | POA: Diagnosis not present

## 2023-01-11 DIAGNOSIS — L409 Psoriasis, unspecified: Secondary | ICD-10-CM | POA: Diagnosis not present

## 2023-01-11 LAB — CBC WITH DIFFERENTIAL/PLATELET
Abs Immature Granulocytes: 0.34 10*3/uL — ABNORMAL HIGH (ref 0.00–0.07)
Abs Immature Granulocytes: 0.34 10*3/uL — ABNORMAL HIGH (ref 0.00–0.07)
Basophils Absolute: 0.1 10*3/uL (ref 0.0–0.1)
Basophils Absolute: 0.1 10*3/uL (ref 0.0–0.1)
Basophils Relative: 1 %
Basophils Relative: 1 %
Eosinophils Absolute: 0.5 10*3/uL (ref 0.0–0.5)
Eosinophils Absolute: 0.5 10*3/uL (ref 0.0–0.5)
Eosinophils Relative: 4 %
Eosinophils Relative: 4 %
HCT: 33.4 % — ABNORMAL LOW (ref 39.0–52.0)
HCT: 33.5 % — ABNORMAL LOW (ref 39.0–52.0)
Hemoglobin: 10.6 g/dL — ABNORMAL LOW (ref 13.0–17.0)
Hemoglobin: 10.9 g/dL — ABNORMAL LOW (ref 13.0–17.0)
Immature Granulocytes: 3 %
Immature Granulocytes: 3 %
Lymphocytes Relative: 16 %
Lymphocytes Relative: 15 %
Lymphs Abs: 2 10*3/uL (ref 0.7–4.0)
Lymphs Abs: 2 10*3/uL (ref 0.7–4.0)
MCH: 26.4 pg (ref 26.0–34.0)
MCH: 27 pg (ref 26.0–34.0)
MCHC: 31.7 g/dL (ref 30.0–36.0)
MCHC: 32.5 g/dL (ref 30.0–36.0)
MCV: 83.3 fL (ref 80.0–100.0)
MCV: 82.9 fL (ref 80.0–100.0)
Monocytes Absolute: 1.4 10*3/uL — ABNORMAL HIGH (ref 0.1–1.0)
Monocytes Absolute: 1.4 10*3/uL — ABNORMAL HIGH (ref 0.1–1.0)
Monocytes Relative: 11 %
Monocytes Relative: 10 %
Neutro Abs: 8.3 10*3/uL — ABNORMAL HIGH (ref 1.7–7.7)
Neutro Abs: 9 10*3/uL — ABNORMAL HIGH (ref 1.7–7.7)
Neutrophils Relative %: 65 %
Neutrophils Relative %: 67 %
Platelets: 457 10*3/uL — ABNORMAL HIGH (ref 150–400)
Platelets: 464 10*3/uL — ABNORMAL HIGH (ref 150–400)
RBC: 4.01 MIL/uL — ABNORMAL LOW (ref 4.22–5.81)
RBC: 4.04 MIL/uL — ABNORMAL LOW (ref 4.22–5.81)
RDW: 13.2 % (ref 11.5–15.5)
RDW: 13.2 % (ref 11.5–15.5)
WBC: 12.6 10*3/uL — ABNORMAL HIGH (ref 4.0–10.5)
WBC: 13.3 10*3/uL — ABNORMAL HIGH (ref 4.0–10.5)
nRBC: 0 % (ref 0.0–0.2)
nRBC: 0 % (ref 0.0–0.2)

## 2023-01-11 LAB — COMPREHENSIVE METABOLIC PANEL
ALT: 17 U/L (ref 0–44)
ALT: 16 U/L (ref 0–44)
AST: 16 U/L (ref 15–41)
AST: 15 U/L (ref 15–41)
Albumin: 2.3 g/dL — ABNORMAL LOW (ref 3.5–5.0)
Albumin: 2.3 g/dL — ABNORMAL LOW (ref 3.5–5.0)
Alkaline Phosphatase: 32 U/L — ABNORMAL LOW (ref 38–126)
Alkaline Phosphatase: 34 U/L — ABNORMAL LOW (ref 38–126)
Anion gap: 8 (ref 5–15)
Anion gap: 10 (ref 5–15)
BUN: 8 mg/dL (ref 6–20)
BUN: 8 mg/dL (ref 6–20)
CO2: 22 mmol/L (ref 22–32)
CO2: 22 mmol/L (ref 22–32)
Calcium: 7.8 mg/dL — ABNORMAL LOW (ref 8.9–10.3)
Calcium: 8.1 mg/dL — ABNORMAL LOW (ref 8.9–10.3)
Chloride: 102 mmol/L (ref 98–111)
Chloride: 102 mmol/L (ref 98–111)
Creatinine, Ser: 0.45 mg/dL — ABNORMAL LOW (ref 0.61–1.24)
Creatinine, Ser: 0.5 mg/dL — ABNORMAL LOW (ref 0.61–1.24)
GFR, Estimated: >60 mL/min (ref >60)
GFR, Estimated: >60 mL/min (ref >60)
Glucose, Bld: 85 mg/dL (ref 70–99)
Glucose, Bld: 83 mg/dL (ref 70–99)
Potassium: 3.6 mmol/L (ref 3.5–5.1)
Potassium: 3.7 mmol/L (ref 3.5–5.1)
Sodium: 132 mmol/L — ABNORMAL LOW (ref 135–145)
Sodium: 134 mmol/L — ABNORMAL LOW (ref 135–145)
Total Bilirubin: 0.6 mg/dL (ref 0.3–1.2)
Total Bilirubin: 0.5 mg/dL (ref 0.3–1.2)
Total Protein: 5.3 g/dL — ABNORMAL LOW (ref 6.5–8.1)
Total Protein: 5.3 g/dL — ABNORMAL LOW (ref 6.5–8.1)

## 2023-01-11 LAB — CULTURE, BLOOD (ROUTINE X 2)
Culture: NO GROWTH
Culture: NO GROWTH
Special Requests: ADEQUATE
Special Requests: ADEQUATE

## 2023-01-11 LAB — MAGNESIUM
Magnesium: 1.9 mg/dL (ref 1.7–2.4)
Magnesium: 2 mg/dL (ref 1.7–2.4)

## 2023-01-11 NOTE — Plan of Care (Signed)
  Problem: Education: Goal: Knowledge of General Education information will improve Description: Including pain rating scale, medication(s)/side effects and non-pharmacologic comfort measures Outcome: Progressing   Problem: Health Behavior/Discharge Planning: Goal: Ability to manage health-related needs will improve Outcome: Progressing   Problem: Clinical Measurements: Goal: Ability to maintain clinical measurements within normal limits will improve Outcome: Progressing Goal: Will remain free from infection Outcome: Progressing Goal: Diagnostic test results will improve Outcome: Progressing Goal: Respiratory complications will improve Outcome: Progressing Goal: Cardiovascular complication will be avoided Outcome: Progressing   Problem: Activity: Goal: Risk for activity intolerance will decrease Outcome: Progressing   Problem: Nutrition: Goal: Adequate nutrition will be maintained Outcome: Progressing   Problem: Coping: Goal: Level of anxiety will decrease Outcome: Progressing   Problem: Elimination: Goal: Will not experience complications related to bowel motility Outcome: Progressing Goal: Will not experience complications related to urinary retention Outcome: Progressing   Problem: Pain Managment: Goal: General experience of comfort will improve Outcome: Progressing   Problem: Safety: Goal: Ability to remain free from injury will improve Outcome: Progressing   Problem: Skin Integrity: Goal: Risk for impaired skin integrity will decrease Outcome: Progressing   Problem: Education: Goal: Knowledge of risk factors and measures for prevention of condition will improve Outcome: Progressing   Problem: Coping: Goal: Psychosocial and spiritual needs will be supported Outcome: Progressing   

## 2023-01-11 NOTE — Progress Notes (Signed)
PROGRESS NOTE   Carlos [name]  [id number] DOB: 12/30/1965 DOA: 12/28/2022 PCP: Nicholes Rough, PA-C   Date of Service: the patient was seen and examined on 01/11/2023  Brief Narrative:  56 year old male with history of spinal ependymoma status post surgery, psoriasis,bed bound  who presented with 5-day history of abdominal pain,  On admission, CT scan showed functional low-grade SBO with large amount of stool in the colon.  Hospitalist group was contacted and patient was admitted to the hospital.    General surgery was consulted as well as gastroenterology.  NG tube was temporarily placed which has subsequently been removed on 3/1 with return of bowel function.  Hospital course complicated by [id number] infection with positivity on 2/29.  Patient was treated with a 5-day course of Paxlovid from 3/1 until 3/5.  Concerning the etiology of the ileus/low-grade bowel obstruction, GI feels that this is secondary to GI hypomotility/dysmotility.  They recommend gastroscintigraphy in the outpatient setting after discharge.  Tenuous oral intake throughout the hospitalization prompted a calorie count with the assistance of nutrition.    Assessment and Plan: * Generalized dysmotility of intestine Patient felt to be suffering from intestinal dysmotility secondary to history of spinal ependymoma by both surgery and gastroenterology Patient reports continued improvement in oral intake Actively monitoring oral intake including calorie count  After admission, GI recommending outpatient gastroscintigraphy -will need to arrange for outpatient follow-up upon discharge. Encouraging oral intake Nutrition assisting with calorie count to ensure caloric needs are being met Continue daily bowel regimen.  Adding an enema today. Continue to monitor oral intake throughout the weekend, will discuss with patient, family and gastroenterology.  SBO (small bowel obstruction) (HCC) See assessment and plan  above  Protein-calorie malnutrition, severe ([place]) Concern for progression of severe protein calorie malnutrition due to poor oral intake due to ongoing dysmotility Oral intake reportedly slowly improving. Undergoing calorie count with the assistance of nutrition which is appreciated Encouraging oral intake Ensure supplements Continuing to monitor oral intake throughout the weekend.  If oral intake fails to significantly improve we may need to resort to alternative sources of nutrition after discussions with patient, family and gastroenterology.  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  Leukocytosis still persisting however review of older labs seem to reveal chronic leukocytosis since at least 2020. Repeat procalcitonin, lactic acidosis, chest x-ray and urinalysis performed on 3/7 unremarkable.   No evidence of new sources of infection at this time. 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  Patient states that he has had several bowel movements today.  Patient denies any episodes of  vomiting.  Physical Exam:  Vitals:   01/10/23 1341 01/11/23 0543 01/11/23 1354 01/11/23 2051  BP: 108/72 108/70 101/68 113/69  Pulse: 95 100 (!)  104 99  Resp: '16 19 18 18  '$ Temp: (!) 97.4 F (36.3 C) 98.1 F (36.7 C) 98.4 F (36.9 C) 98.8 F (37.1 C)  TempSrc:    Oral  SpO2: 97% 95% 96% 95%  Weight:      Height:         Constitutional: Awake alert and oriented x3, no associated distress.   Skin: Significant scaly rash over the face with excoriations improved since yesterday and the day before.     Eyes: Pupils are equally reactive to light.  No evidence of scleral icterus or conjunctival pallor.  ENMT: Moist mucous membranes noted.  Posterior pharynx clear of any exudate or lesions.   Respiratory: Mild basilar rales.  No evidence of wheezing.  Diminished breath sounds at the bases.  Normal respiratory effort. No accessory muscle use.  Cardiovascular: Regular rate and rhythm, no murmurs / rubs / gallops. No extremity edema. 2+ pedal pulses. No carotid bruits.  Abdomen: Abdomen is still somewhat protuberant but soft and nontender.  No evidence of intra-abdominal masses.  Positive bowel sounds noted in all quadrants.   Musculoskeletal: Notable contractures of the extremities, particularly the left upper extremity, with poor muscle tone.   Data Reviewed:  I have personally reviewed and interpreted labs, imaging.  Significant findings are   CBC: Recent Labs  Lab 01/06/23 0313 01/09/23 0412 01/10/23 0411 01/11/23 0815 01/11/23 0927  WBC 16.7* 17.1* 15.1* 12.6* 13.3*  NEUTROABS 11.9* 11.6* 10.3* 8.3* 9.0*  HGB 11.9* 10.8* 11.2* 10.6* 10.9*  HCT 38.4* 33.6* 34.9* 33.4* 33.5*  MCV 85.1 83.6 83.1 83.3 82.9  PLT 343 396 444* 457* [id number]*   Basic Metabolic Panel: Recent Labs  Lab 01/08/23 0350 01/09/23 0412 01/10/23 0411 01/11/23 0815 01/11/23 0927  NA 134* 132* 136 132* 134*  K 3.6 3.8 3.6 3.6 3.7  CL 101 103 101 102 102  CO2 '25 23 24 22 22  '$ GLUCOSE 116* 116* 107* 85  83  BUN <5* 8 <5* 8 8  CREATININE 0.49* 0.35* 0.42* 0.45* 0.50*  CALCIUM 7.6* 7.4* 7.8* 7.8* 8.1*  MG  --  1.5* 2.3 1.9 2.0   GFR: Estimated Creatinine Clearance: 129.6 mL/min (A) (by C-G formula based on SCr of 0.5 mg/dL (L)). Liver Function Tests: Recent Labs  Lab 01/09/23 0412 01/10/23 0411 01/11/23 0815 01/11/23 0927  AST 14* '15 16 15  '$ ALT '13 16 17 16  '$ ALKPHOS 28* 34* 32* 34*  BILITOT 0.4 0.5 0.6 0.5  PROT 4.7* 5.2* 5.3* 5.3*  ALBUMIN 1.9* 2.2* 2.3* 2.3*      Code Status:  Full code.  Code status decision has been confirmed with: patient Family Communication: Attempts have been made to contact the mother via listed phone number and have been unsuccessful.   Severity of Illness:  The appropriate patient status for this patient is INPATIENT. Inpatient status is judged to be reasonable and necessary in order to provide the required intensity of service to ensure the patient's safety. The patient's presenting symptoms, physical exam findings, and initial radiographic and laboratory data in the context of their chronic comorbidities is felt to place them at high risk for further clinical deterioration. Furthermore, it is not anticipated that the patient will be medically stable for discharge from the hospital within 2 midnights of admission.   * I certify that at the point of admission it is my clinical judgment that the patient will require inpatient hospital care spanning beyond 2 midnights from the point of admission due  to high intensity of service, high risk for further deterioration and high frequency of surveillance required.*  Time spent:  35 minutes  Author:  Vernelle Emerald MD  01/11/2023 10:21 PM

## 2023-01-11 NOTE — Plan of Care (Signed)
  Problem: Education: Goal: Knowledge of General Education information will improve Description Including pain rating scale, medication(s)/side effects and non-pharmacologic comfort measures Outcome: Progressing   

## 2023-01-12 DIAGNOSIS — U071 COVID-19: Secondary | ICD-10-CM | POA: Diagnosis not present

## 2023-01-12 DIAGNOSIS — K5989 Other specified functional intestinal disorders: Secondary | ICD-10-CM | POA: Diagnosis not present

## 2023-01-12 DIAGNOSIS — R7881 Bacteremia: Secondary | ICD-10-CM | POA: Diagnosis not present

## 2023-01-12 DIAGNOSIS — L409 Psoriasis, unspecified: Secondary | ICD-10-CM | POA: Diagnosis not present

## 2023-01-12 DIAGNOSIS — E43 Unspecified severe protein-calorie malnutrition: Secondary | ICD-10-CM | POA: Diagnosis not present

## 2023-01-12 DIAGNOSIS — R6510 Systemic inflammatory response syndrome (SIRS) of non-infectious origin without acute organ dysfunction: Secondary | ICD-10-CM | POA: Diagnosis not present

## 2023-01-12 DIAGNOSIS — K56609 Unspecified intestinal obstruction, unspecified as to partial versus complete obstruction: Secondary | ICD-10-CM | POA: Diagnosis not present

## 2023-01-12 DIAGNOSIS — E876 Hypokalemia: Secondary | ICD-10-CM | POA: Diagnosis not present

## 2023-01-12 DIAGNOSIS — Z7689 Persons encountering health services in other specified circumstances: Secondary | ICD-10-CM | POA: Diagnosis not present

## 2023-01-12 DIAGNOSIS — C720 Malignant neoplasm of spinal cord: Secondary | ICD-10-CM | POA: Diagnosis not present

## 2023-01-12 MED ORDER — ENSURE ENLIVE PO LIQD: 237.0000 mL | Freq: Three times a day (TID) | ORAL | 12 refills | Status: AC

## 2023-01-12 MED ORDER — SENNA 8.6 MG PO TABS: 2.0000 | ORAL_TABLET | Freq: Every day | ORAL | 2 refills | Status: AC

## 2023-01-12 MED ORDER — POLYETHYLENE GLYCOL 3350 17 G PO PACK: 17.0000 g | PACK | Freq: Every day | ORAL | 2 refills | Status: AC

## 2023-01-12 MED ORDER — HYDROCORTISONE 1 % EX CREA: TOPICAL_CREAM | Freq: Two times a day (BID) | CUTANEOUS | 2 refills | Status: AC | PRN

## 2023-01-12 NOTE — Plan of Care (Signed)

## 2023-01-12 NOTE — TOC Transition Note (Signed)
Transition of Care [facility] [address]) - CM/SW Discharge Note   Patient Details  Name: Carlos [name] MRN: [id number] Date of Birth: 06-23-1966  Transition of Care [facility]) CM/SW Contact:  Rodney Booze, LCSW Phone Number: 01/12/2023, 11:27 AM   Clinical Narrative:       Final next level of care: Home w Home Health Services Barriers to Discharge: No Barriers Identified   Patient Goals and CMS Choice CMS Medicare.gov Compare Post Acute Care list provided to:: Patient Choice offered to / list presented to : Patient  Discharge Placement    CSW met with the patient and mother at bedside. Family is aware that DME has been ordered. Patient is aware of the [facility] agency that is set up for the family. CSW also spoke to the family about setting up home heath through [facility]. At this time there were no other TOC needs. Patient will transport home by [facility].                 Name of family member notified: Patient and mother Patient and family notified of of transfer: 01/12/23  Discharge Plan and Services Additional resources added to the After Visit Summary for   In-house Referral: Clinical Social Work   Post Acute Care Choice: Home Health          DME Arranged: Other see comment Harrel Lemon lift, transport chair) DME Agency: [employer] Date DME Agency Contacted: 01/06/23   Representative spoke with at DME Agency: Erasmo Downer HH Arranged: PT [place]: [place] Date [place]: 12/30/22   Representative spoke with at [place]: [place] ([place]) Interventions SDOH Screenings   Tobacco Use: Low Risk  (12/29/2022)     Readmission Risk Interventions     No data to display

## 2023-01-12 NOTE — Progress Notes (Signed)
The patient is alert and oriented and has been seen by his physician. The orders for discharge were written. IV has been removed. Went over discharge instructions with patient and family. He is being discharged home via [facility] with all of his belongings.

## 2023-01-12 NOTE — Discharge Instructions (Addendum)
You have been provided with an attached resource guide by our transition of care team.  Reach out to the resources provided to see about extra help with home care aides. Please follow up with medicaid as well as Social Security to see what resources they may offer for [facility] aids and extra care. Please advance your diet as tolerated Please participate in the physical therapy program as directed by your home health physical therapist Out of bed to wheelchair with assistance with assistive devices provided Please follow-up with your primary care provider in 1 to 2 weeks Please follow-up with outpatient gastroenterology such as [facility] gastroenterology for further evaluation and workup if you continue to have difficulty with your appetite or tolerating oral intake Please note that you had a high white blood cell count throughout this hospitalization without evidence of infection.  Please request a repeat blood count with your primary care provider in several weeks and if this is persisting a referral to hematology for further workup may be indicated. Please take your bowel regimen as instructed to have at least 1 bowel movement every 1 to 2 days.  If you do not achieve a bowel movement within 48 hours consider taking a Dulcolax suppository or Fleet enema.

## 2023-01-12 NOTE — Discharge Summary (Signed)
Physician Discharge Summary   Patient: Carlos [name] MRN: [id number] DOB: 09/23/1966  Admit date:     12/28/2022  Discharge date: 01/12/23  Discharge Physician: Vernelle Emerald   PCP: Nicholes Rough, PA-C   Recommendations at discharge:   You have been provided with an attached resource guide by our transition of care team.  Reach out to the resources provided to see about extra help with home care aides. Please follow up with medicaid as well as Social Security to see what resources they may offer for [facility] aids and extra care. Please advance your diet as tolerated Please participate in the physical therapy program as directed by your home health physical therapist Out of bed to wheelchair with assistance with assistive devices provided Please follow-up with your primary care provider in 1 to 2 weeks Please follow-up with outpatient gastroenterology such as [facility] gastroenterology for further evaluation and workup if you continue to have difficulty with your appetite or tolerating oral intake Please note that you had a high white blood cell count throughout this hospitalization without evidence of infection.  Please request a repeat blood count with your primary care provider in several weeks and if this is persisting a referral to hematology for further workup may be indicated.  Discharge Diagnoses: Principal Problem:   Generalized dysmotility of intestine Active Problems:   SBO (small bowel obstruction) (HCC)   Protein-calorie malnutrition, severe (HCC)   COVID-19 virus infection   Bacteremia   SIRS (systemic inflammatory response syndrome) (HCC)   Hypokalemia   Spinal cord ependymoma (HCC)   Psoriasis   Hypomagnesemia   Leukocytosis   S/P laminectomy   Joint contracture of the upper arm   Contracture of hand  Resolved Problems:   Bedridden   Hospital Course: 57 year old male with history of spinal ependymoma status post surgery, psoriasis,bed bound  who presented with 5-day  history of abdominal pain,  On admission, CT scan showed functional low-grade SBO with large amount of stool in the colon.  Hospitalist group was contacted and patient was admitted to the hospital.    General surgery was consulted as well as gastroenterology.  NG tube was temporarily placed which has subsequently been removed on 3/1 with return of bowel function.  Hospital course complicated by [id number] infection with positivity on 2/29.  Patient was treated with a 5-day course of Paxlovid from 3/1 until 3/5.  Concerning the etiology of the ileus/low-grade bowel obstruction, will general surgery and GI were both concerned that this could possibly be secondary to hypomotility/dysmotility of the gut.  After this hospitalization they recommended further outpatient workup.  In the days that followed patient's oral intake slowly improved as did the patient's abdominal bloating and discomfort.    Of note, also during this hospitalization patient was found to have a blood culture on 2/27 positive for micrococcus and another culture on 2/25 positive for staph cohnii/epidermidis.  Infectious ease was consulted and patient was empirically treated with a course of Zyvox.  After repeated blood cultures that exhibited no growth this was felt to be a contaminant and antibiotics were discontinued.    It is also worth noting the patient has exhibited leukocytosis throughout the hospitalization  despite a number of  workups for underlying sources of infection that have been negative.  At this point it is felt that the patient's leukocytosis is chronic as blood work in 2020 also shows leukocytosis.  Patient has been recommended to follow-up with his primary care provider for repeat CBC in several  weeks and if this persists to consider a hematology referral.    Upon airing for discharge home, arrangements have been made for the patient to receive home health physical therapy services.  DME has also been ordered for the  patient's home including a Hoyer lift.  Patient has been instructed to follow-up closely with his primary care provider and follow-up as an outpatient with gastroenterology for potential outpatient workup of possible GI dysmotility.  Patient has been discharged home on 3/10 in improved and stable condition.      Consultants: Dr. Therisa Doyne with Gastroenterology.  Dr. Zenia Resides with General Surgery.  Dr. Baxter Flattery with Infectious Disease  Procedures performed: NG tube placement (since removed)  Disposition: Home health Diet recommendation:  Discharge Diet Orders (From admission, onward)     Start     Ordered   01/12/23 0000  Diet general        01/12/23 1137           Regular diet  DISCHARGE MEDICATION: Allergies as of 01/12/2023       Reactions   Bactrim [sulfamethoxazole-trimethoprim] Hives, Rash        Medication List     STOP taking these medications    baclofen 10 MG tablet Commonly known as: LIORESAL       TAKE these medications    acetaminophen 325 MG tablet Commonly known as: TYLENOL Take 2 tablets (650 mg total) by mouth every 6 (six) hours as needed for mild pain (or Fever >/= 101).   feeding supplement Liqd Take 237 mLs by mouth 3 (three) times daily between meals.   hydrocortisone cream 1 % Apply topically 2 (two) times daily as needed for itching. for itchy scaly plaques on face   polyethylene glycol 17 g packet Commonly known as: MIRALAX / GLYCOLAX Take 17 g by mouth daily as needed for mild constipation. What changed: Another medication with the same name was added. Make sure you understand how and when to take each.   polyethylene glycol 17 g packet Commonly known as: MIRALAX / GLYCOLAX Take 17 g by mouth daily. What changed: You were already taking a medication with the same name, and this prescription was added. Make sure you understand how and when to take each.   senna 8.6 MG Tabs tablet Commonly known as: SENOKOT Take 2 tablets (17.2 mg total)  by mouth daily. What changed: how much to take               Durable Medical Equipment  (From admission, onward)           Start     Ordered   01/06/23 1206  For home use only DME Other see comment  Once       Comments: Transport chair  Question:  Length of Need  Answer:  Lifetime   01/06/23 1205   01/06/23 1205  For home use only DME Other see comment  Once       Comments: Harrel Lemon for home  Question:  Length of Need  Answer:  Lifetime   01/06/23 1205            Follow-up Information     [facility] [place] Follow up.   Specialty: Home Health Services Why: [facility] will provide PT in the home after discharge. Contact information: [address] [place] [address] [phone]         Nicholes Rough, PA-C. Schedule an appointment as soon as possible for a visit in 1  week(s).   Specialty: Physician Assistant Contact information: [address] [place] [address] [phone]         Gastroenterology, [facility]. Schedule an appointment as soon as possible for a visit in 2 week(s).   Contact information: [place] [address] [phone]                 Discharge Exam: Danley Danker Weights   12/28/22 2326  Weight: 90.7 kg    Constitutional: Awake alert and oriented x3, no associated distress.   Respiratory: clear to auscultation bilaterally, no wheezing, no crackles.  Diminished breath sounds at the bases. Cardiovascular: Regular rate and rhythm, no murmurs / rubs / gallops. No extremity edema. 2+ pedal pulses. No carotid bruits.  Abdomen: Abdomen is soft and nontender.  No evidence of intra-abdominal masses.  Positive bowel sounds noted in all quadrants.   Musculoskeletal: Extremely poor muscle tone.  Contractures of the left upper extremity.    Condition at discharge: fair  The results of significant diagnostics from this hospitalization (including imaging, microbiology, ancillary and laboratory) are  listed below for reference.   Imaging Studies: DG Abd 1 View  Result Date: 01/10/2023 CLINICAL DATA:  Ileus. EXAM: ABDOMEN - 1 VIEW COMPARISON:  January 07, 2023. FINDINGS: No abnormal bowel dilatation is noted. Moderate amount of stool seen throughout the colon as well as some degree of contrast. Stable gastric distention. IMPRESSION: Stable gastric distension. No abnormal large or small bowel dilatation. Electronically Signed   By: Marijo Conception M.D.   On: 01/10/2023 08:10   DG Chest 1 View  Result Date: 01/09/2023 CLINICAL DATA:  Pneumonia EXAM: CHEST  1 VIEW COMPARISON:  12/29/2022 FINDINGS: Mild perihilar edema is possible, although equivocal. Layering moderate bilateral pleural effusions. Associated mild bibasilar atelectasis. No pneumothorax. The heart is normal in size. IMPRESSION: Layering moderate bilateral pleural effusions. Possible mild perihilar edema, equivocal. Electronically Signed   By: Julian Hy M.D.   On: 01/09/2023 22:38   DG Abd Portable 1V  Result Date: 01/07/2023 CLINICAL DATA:  Abdominal distention, vomiting EXAM: PORTABLE ABDOMEN - 1 VIEW COMPARISON:  01/01/2023 FINDINGS: There is interval removal of enteric tube. Stomach is distended. There are few slightly dilated gas-filled small bowel loops in upper mid abdomen. Gas and stool are present in colon. IMPRESSION: Mild gaseous dilation of small bowel loops may suggest ileus. There is moderate gaseous distention of stomach. Electronically Signed   By: Elmer Picker M.D.   On: 01/07/2023 08:13   DG Abd Portable 1V  Result Date: 01/01/2023 CLINICAL DATA:  Small bowel obstruction. EXAM: PORTABLE ABDOMEN - 1 VIEW COMPARISON:  December 31, 2022. FINDINGS: Nasogastric tube tip is seen in proximal stomach. No abnormal bowel dilatation is noted. Moderate amount of stool is seen throughout the colon. IMPRESSION: No abnormal bowel dilatation. Electronically Signed   By: Marijo Conception M.D.   On: 01/01/2023 08:18   CT  ABDOMEN PELVIS W CONTRAST  Result Date: 12/31/2022 CLINICAL DATA:  Abdominal pain and possible small bowel obstruction on previous CT. EXAM: CT ABDOMEN AND PELVIS WITH CONTRAST TECHNIQUE: Multidetector CT imaging of the abdomen and pelvis was performed using the standard protocol following bolus administration of intravenous contrast. RADIATION DOSE REDUCTION: This exam was performed according to the departmental dose-optimization program which includes automated exposure control, adjustment of the mA and/or kV according to patient size and/or use of iterative reconstruction technique. CONTRAST:  114m OMNIPAQUE IOHEXOL 300 MG/ML  SOLN COMPARISON:  12/29/2022 FINDINGS: Lower chest: Heart is normal size. Small amount of bilateral pleural fluid with associated bibasilar atelectasis as infection is less likely. These findings are worse compared to the recent prior exam. Hepatobiliary: Gallbladder is somewhat contracted. Liver and biliary tree are normal. Pancreas: Normal. Spleen: Normal. Adrenals/Urinary Tract: Adrenal glands are normal. Kidneys are normal in size without hydronephrosis or nephrolithiasis. Ureters and bladder are normal. Stomach/Bowel: Nasogastric tube has tip over the body of the stomach in the left upper quadrant. Interval decompression of the small bowel is normal in caliber as there is been interval decompression of the previously seen dilated small bowel loops. Mild fecal retention over the rectum with mild associated wall thickening which could be seen with stercoral colitis. The sigmoid colon is redundant as there is new narrowing of a segment of sigmoid colon over the midline lower abdomen. This is not appear to be obstructive. Mild fecal retention throughout the remainder of the colon. No evidence of vascular compromise. No free peritoneal air. Vascular/Lymphatic: Aorta is normal caliber. No evidence of adenopathy. Remaining vascular structures are unremarkable. Reproductive: Normal. Other:  Minimal air within the subcutaneous fat of the abdominal wall likely due to recent intervention. Right inguinal hernia containing only peritoneal fat unchanged. Musculoskeletal: No focal abnormality. IMPRESSION: 1. Nasogastric tube present with interval decompression of the previously seen dilated small bowel loops. 2. Mild fecal retention over the rectum with mild associated wall thickening which could be seen with stercoral colitis. The sigmoid colon is redundant as there is new narrowing of a segment of sigmoid colon over the midline lower abdomen which does not appear to be obstructive. No definite volvulus or evidence of vascular compromise. 3. Small amount of bilateral pleural fluid with associated bibasilar atelectasis as infection is less likely. These findings are worse compared to the recent prior exam. 4. Right inguinal hernia containing only peritoneal fat unchanged. Electronically Signed   By: Marin Olp M.D.   On: 12/31/2022 21:58   DG Abd Portable 1V  Result Date: 12/31/2022 CLINICAL DATA:  Small bowel obstruction EXAM: PORTABLE ABDOMEN - 1 VIEW COMPARISON:  Yesterday FINDINGS: Enteric tube with tip at the stomach where there is enteric contrast. Diffuse gaseous distension of colon and somewhat affecting small bowel as well. No concerning mass effect or gas collection. IMPRESSION: Unchanged gas dilated bowel. Electronically Signed   By: Jorje Guild M.D.   On: 12/31/2022 07:52   DG Abd Portable 1V-Small Bowel Obstruction Protocol-initial, 8 hr delay  Result Date: 12/30/2022 CLINICAL DATA:  SBO protocol.  8 hour delay image EXAM: PORTABLE ABDOMEN - 1 VIEW COMPARISON:  Radiograph 12/30/2022 at 7:14 a.m. FINDINGS: Persistent small bowel dilation similar to radiographs earlier today. Normal caliber colon with moderate stool burden. Enteric tube tip in the stomach with side port at the GE junction. Enteric contrast within the gastric fundus. No enteric contrast is seen beyond the stomach.  IMPRESSION: Persistent small bowel obstruction. Enteric contrast has not progressed beyond the stomach. Electronically Signed   By: Placido Sou M.D.   On: 12/30/2022 20:45   DG Abd 1 View  Result Date: 12/30/2022 CLINICAL DATA:  [id number] Small bowel obstruction ([place]) [id number] EXAM: ABDOMEN - 1 VIEW COMPARISON:  CT 12/29/2022 FINDINGS: There is persistent small bowel dilation with normal caliber colon. Nasogastric tube side port overlies the region of the GE junction, tip overlies the stomach. The stomach is distended. Moderate stool burden in the colon. IMPRESSION: Persistent small bowel obstruction. Nasogastric tube side port overlies the region  of the GE junction, recommend advancement by 6.0 cm. Electronically Signed   By: Maurine Simmering M.D.   On: 12/30/2022 07:59   DG Abdomen 1 View  Result Date: 12/29/2022 CLINICAL DATA:  57 year old male status post nasogastric tube placement. EXAM: ABDOMEN - 1 VIEW COMPARISON:  Abdominal radiograph 12/29/2022. FINDINGS: Nasogastric tube has been advanced, now with the side port proximally 5.5 cm distal to the gastroesophageal junction. Persistent distention of the stomach is noted in the visualized upper abdomen. Abdomen is partially imaged. Areas of atelectasis and/or scarring noted in the lung bases. IMPRESSION: Nasogastric tube appears appropriately located. Electronically Signed   By: Vinnie Langton M.D.   On: 12/29/2022 06:02   DG Abdomen 1 View  Result Date: 12/29/2022 CLINICAL DATA:  57 year old male status post nasogastric tube placement. EXAM: ABDOMEN - 1 VIEW COMPARISON:  Chest x-ray 12/29/2022. FINDINGS: Nasogastric tube tip is in the proximal stomach, with side port in the distal esophagus. Irregular opacities are noted in the lung bases bilaterally, likely to reflect areas of scarring and/or atelectasis. Abdomen is incompletely imaged, but visualized upper abdomen demonstrates gaseous distention of the stomach. IMPRESSION: 1. Nasogastric tube tip is  in the proximal stomach, but side port remains in the distal esophagus. The tube should be advanced at least 10 cm for more optimal placement. Electronically Signed   By: Vinnie Langton M.D.   On: 12/29/2022 05:30   CT ABDOMEN PELVIS W CONTRAST  Addendum Date: 12/29/2022   ADDENDUM REPORT: 12/29/2022 03:46 ADDENDUM: Jejunal transition point best visualized on series 2, image 50. This may indicate a low-grade or functional small-bowel obstruction. The distal small bowel is decompressed. Large amount of stool in the colon. Electronically Signed   By: Ulyses Jarred M.D.   On: 12/29/2022 03:46   Result Date: 12/29/2022 CLINICAL DATA:  Small-bowel obstruction EXAM: CT ABDOMEN AND PELVIS WITH CONTRAST TECHNIQUE: Multidetector CT imaging of the abdomen and pelvis was performed using the standard protocol following bolus administration of intravenous contrast. RADIATION DOSE REDUCTION: This exam was performed according to the departmental dose-optimization program which includes automated exposure control, adjustment of the mA and/or kV according to patient size and/or use of iterative reconstruction technique. CONTRAST:  147m OMNIPAQUE IOHEXOL 300 MG/ML  SOLN COMPARISON:  None Available. FINDINGS: Lower Chest: Normal. Hepatobiliary: Normal hepatic contours. No intra- or extrahepatic biliary dilatation. The gallbladder is normal. Pancreas: Normal pancreas. No ductal dilatation or peripancreatic fluid collection. Spleen: Normal. Adrenals/Urinary Tract: The adrenal glands are normal. No hydronephrosis, nephroureterolithiasis or solid renal mass. The urinary bladder is normal for degree of distention Stomach/Bowel: Distended and fluid-filled stomach with fluid extending into the distal thoracic esophagus. Mildly dilated duodenum and proximal jejunum. Gradual transition in the mid abdomen. No focal colonic abnormality. Normal appendix. Vascular/Lymphatic: Normal course and caliber of the major abdominal vessels. No  abdominal or pelvic lymphadenopathy. Reproductive: Normal prostate size with symmetric seminal vesicles. Other: None. Musculoskeletal: No bony spinal canal stenosis or focal osseous abnormality. IMPRESSION: 1. Distended and fluid-filled stomach with fluid extending into the distal thoracic esophagus. 2. Mildly dilated duodenum and proximal jejunum with gradual transition point in the mid abdomen. Electronically Signed: By: KUlyses JarredM.D. On: 12/29/2022 03:18   DG Abdomen Acute W/Chest  Result Date: 12/29/2022 CLINICAL DATA:  Shortness of breath and abdominal distention EXAM: DG ABDOMEN ACUTE WITH 1 VIEW CHEST COMPARISON:  None Available. FINDINGS: Atelectasis or scarring in the right hilum and lower lung. Hyperinflation of the left lung. No pleural effusion or  pneumothorax. Normal cardiomediastinal silhouette. No acute osseous abnormality. Marked gaseous distention of the stomach. Gaseous dilation of multiple loops of small bowel in the central abdomen measuring up to 6.3 cm in diameter. Gas-filled colon without definite dilation. IMPRESSION: Marked gaseous dilation of the stomach and small bowel. CT is recommended to evaluate for obstruction. Atelectasis or scarring in the right mid and lower lung. Hyperinflation of the left lung. Electronically Signed   By: Placido Sou M.D.   On: 12/29/2022 01:44    Microbiology: Results for orders placed or performed during the hospital encounter of 12/28/22  Blood culture (routine x 2)     Status: Abnormal   Collection Time: 12/29/22  3:22 AM   Specimen: BLOOD RIGHT HAND  Result Value Ref Range Status   Specimen Description   Final    BLOOD RIGHT HAND BOTTLES DRAWN AEROBIC AND ANAEROBIC Performed at [facility], [place] [address], [place], [place] [address]    Special Requests   Final    Blood Culture adequate volume Performed at [facility] [address], [place], [place] [address]    Culture  Setup Time   Final     GRAM POSITIVE COCCI IN CLUSTERS IN BOTH AEROBIC AND ANAEROBIC BOTTLES CRITICAL RESULT CALLED TO, READ BACK BY AND VERIFIED WITH: [place] 12/30/22 @ 0009 BY AB    Culture (A)  Final    STAPHYLOCOCCUS COHNII STAPHYLOCOCCUS EPIDERMIDIS THE SIGNIFICANCE OF ISOLATING THIS ORGANISM FROM A SINGLE SET OF BLOOD CULTURES WHEN MULTIPLE SETS ARE DRAWN IS UNCERTAIN. PLEASE NOTIFY THE MICROBIOLOGY DEPARTMENT WITHIN ONE WEEK IF SPECIATION AND SENSITIVITIES ARE REQUIRED. Performed at [facility], [place] [address] [place], [place] [address]    Report Status 01/01/2023 FINAL  Final  Blood Culture ID Panel (Reflexed)     Status: Abnormal   Collection Time: 12/29/22  3:22 AM  Result Value Ref Range Status   Enterococcus faecalis NOT DETECTED NOT DETECTED Final   Enterococcus Faecium NOT DETECTED NOT DETECTED Final   Listeria monocytogenes NOT DETECTED NOT DETECTED Final   Staphylococcus species DETECTED (A) NOT DETECTED Final    Comment: CRITICAL RESULT CALLED TO, READ BACK BY AND VERIFIED WITH: PHARMD M. LILLISTON 12/30/22 @ 0009 BY AB    Staphylococcus aureus (BCID) NOT DETECTED NOT DETECTED Final   Staphylococcus epidermidis DETECTED (A) NOT DETECTED Final    Comment: CRITICAL RESULT CALLED TO, READ BACK BY AND VERIFIED WITH: PHARMD M. LILLISTON 12/30/22 @ 0009 BY AB    Staphylococcus lugdunensis NOT DETECTED NOT DETECTED Final   Streptococcus species NOT DETECTED NOT DETECTED Final   Streptococcus agalactiae NOT DETECTED NOT DETECTED Final   Streptococcus pneumoniae NOT DETECTED NOT DETECTED Final   Streptococcus pyogenes NOT DETECTED NOT DETECTED Final   A.calcoaceticus-baumannii NOT DETECTED NOT DETECTED Final   Bacteroides fragilis NOT DETECTED NOT DETECTED Final   Enterobacterales NOT DETECTED NOT DETECTED Final   Enterobacter cloacae complex NOT DETECTED NOT DETECTED Final   Escherichia coli NOT DETECTED NOT DETECTED Final   Klebsiella aerogenes NOT DETECTED NOT DETECTED  Final   Klebsiella oxytoca NOT DETECTED NOT DETECTED Final   Klebsiella pneumoniae NOT DETECTED NOT DETECTED Final   Proteus species NOT DETECTED NOT DETECTED Final   Salmonella species NOT DETECTED NOT DETECTED Final   Serratia marcescens NOT DETECTED NOT DETECTED Final   Haemophilus influenzae NOT DETECTED NOT DETECTED Final   Neisseria meningitidis NOT DETECTED NOT DETECTED Final   Pseudomonas aeruginosa NOT DETECTED NOT  DETECTED Final   Stenotrophomonas maltophilia NOT DETECTED NOT DETECTED Final   Candida albicans NOT DETECTED NOT DETECTED Final   Candida auris NOT DETECTED NOT DETECTED Final   Candida glabrata NOT DETECTED NOT DETECTED Final   Candida krusei NOT DETECTED NOT DETECTED Final   Candida parapsilosis NOT DETECTED NOT DETECTED Final   Candida tropicalis NOT DETECTED NOT DETECTED Final   Cryptococcus neoformans/gattii NOT DETECTED NOT DETECTED Final   Methicillin resistance mecA/C NOT DETECTED NOT DETECTED Final    Comment: Performed at [facility], [place] [address], [place], [place] [address]  Blood culture (routine x 2)     Status: None   Collection Time: 12/29/22  3:33 AM   Specimen: Right Antecubital; Blood  Result Value Ref Range Status   Specimen Description RIGHT ANTECUBITAL BLOOD  Final   Special Requests   Final    Blood Culture results may not be optimal due to an inadequate volume of blood received in culture bottles BOTTLES DRAWN AEROBIC AND ANAEROBIC   Culture   Final    NO GROWTH 5 DAYS Performed at [facility], [place] [address], [place], [place] [address]    Report Status 01/03/2023 FINAL  Final  Culture, blood (Routine X 2) w Reflex to ID Panel     Status: Abnormal   Collection Time: 12/31/22  5:43 PM   Specimen: BLOOD RIGHT ARM  Result Value Ref Range Status   Specimen Description   Final    BLOOD RIGHT ARM Performed at [facility], [place] [address], [place], [place] [address]    Special Requests   Final    BOTTLES DRAWN AEROBIC  ONLY Blood Culture adequate volume Performed at [facility] [address], [place], [place] [address]    Culture  Setup Time   Final    GRAM POSITIVE COCCI IN CLUSTERS AEROBIC BOTTLE ONLY CRITICAL RESULT CALLED TO, READ BACK BY AND VERIFIED WITH: PHARMD ABBIE E. [id number] FCP    Culture (A)  Final    MICROCOCCUS LUTEUS/LYLAE Standardized susceptibility testing for this organism is not available. Performed at [facility], [place] [address] [place], [place] [address]    Report Status 01/03/2023 FINAL  Final  Culture, blood (Routine X 2) w Reflex to ID Panel     Status: Abnormal   Collection Time: 12/31/22  5:43 PM   Specimen: BLOOD  Result Value Ref Range Status   Specimen Description   Final    BLOOD BLOOD RIGHT HAND AEROBIC BOTTLE ONLY Performed at [facility] [address] [place], [place] [address]    Special Requests   Final    BOTTLES DRAWN AEROBIC ONLY Blood Culture adequate volume Performed at [facility] [address], [place], [place] [address]    Culture  Setup Time   Final    GRAM POSITIVE COCCI IN CLUSTERS AEROBIC BOTTLE ONLY CRITICAL VALUE NOTED.  VALUE IS CONSISTENT WITH PREVIOUSLY REPORTED AND CALLED VALUE.    Culture (A)  Final    MICROCOCCUS LUTEUS/LYLAE Standardized susceptibility testing for this organism is not available. Performed at [facility], [place] [address], [place], [place] [address]    Report Status 01/06/2023 FINAL  Final  Resp panel by RT-PCR (RSV, Flu A&B, Covid) Anterior Nasal Swab     Status: Abnormal   Collection Time: 01/02/23  4:02 PM   Specimen: Anterior Nasal Swab  Result Value Ref Range Status  SARS Coronavirus 2 by RT PCR POSITIVE (A) NEGATIVE Final    Comment: (NOTE) SARS-CoV-2 target nucleic acids are DETECTED.  The SARS-CoV-2 RNA is generally detectable in upper respiratory specimens during the acute phase of infection. Positive results  are indicative of the presence of the identified virus, but do not rule out bacterial infection or co-infection with other pathogens not detected by the test. Clinical correlation with patient history and other diagnostic information is necessary to determine patient infection status. The expected result is Negative.  Fact Sheet for Patients: EntrepreneurPulse.com.au  Fact Sheet for Healthcare Providers: IncredibleEmployment.be  This test is not yet approved or cleared by the [place] FDA and  has been authorized for detection and/or diagnosis of SARS-CoV-2 by FDA under an Emergency Use Authorization (EUA).  This EUA will remain in effect (meaning this test can be used) for the duration of  the COVID-19 declaration under Section 564(b)(1) of the A ct, 21 U.S.C. section 360bbb-3(b)(1), unless the authorization is terminated or revoked sooner.     Influenza A by PCR NEGATIVE NEGATIVE Final   Influenza B by PCR NEGATIVE NEGATIVE Final    Comment: (NOTE) The Xpert Xpress SARS-CoV-2/FLU/RSV plus assay is intended as an aid in the diagnosis of influenza from Nasopharyngeal swab specimens and should not be used as a sole basis for treatment. Nasal washings and aspirates are unacceptable for Xpert Xpress SARS-CoV-2/FLU/RSV testing.  Fact Sheet for Patients: EntrepreneurPulse.com.au  Fact Sheet for Healthcare Providers: IncredibleEmployment.be  This test is not yet approved or cleared by the [place] FDA and has been authorized for detection and/or diagnosis of SARS-CoV-2 by FDA under an Emergency Use Authorization (EUA). This EUA will remain in effect (meaning this test can be used) for the duration of the COVID-19 declaration under Section 564(b)(1) of the Act, 21 U.S.C. section 360bbb-3(b)(1), unless the authorization is terminated or revoked.     Resp Syncytial Virus by PCR NEGATIVE NEGATIVE Final     Comment: (NOTE) Fact Sheet for Patients: EntrepreneurPulse.com.au  Fact Sheet for Healthcare Providers: IncredibleEmployment.be  This test is not yet approved or cleared by the [place] FDA and has been authorized for detection and/or diagnosis of SARS-CoV-2 by FDA under an Emergency Use Authorization (EUA). This EUA will remain in effect (meaning this test can be used) for the duration of the COVID-19 declaration under Section 564(b)(1) of the Act, 21 U.S.C. section 360bbb-3(b)(1), unless the authorization is terminated or revoked.  Performed at [facility], [place] [address] [place], [place] [address]   Respiratory (~20 pathogens) panel by PCR     Status: None   Collection Time: 01/02/23  4:02 PM   Specimen: Nasopharyngeal Swab; Respiratory  Result Value Ref Range Status   Adenovirus NOT DETECTED NOT DETECTED Final   Coronavirus 229E NOT DETECTED NOT DETECTED Final    Comment: (NOTE) The Coronavirus on the Respiratory Panel, DOES NOT test for the novel  Coronavirus (2019 nCoV)    Coronavirus HKU1 NOT DETECTED NOT DETECTED Final   Coronavirus NL63 NOT DETECTED NOT DETECTED Final   Coronavirus OC43 NOT DETECTED NOT DETECTED Final   Metapneumovirus NOT DETECTED NOT DETECTED Final   Rhinovirus / Enterovirus NOT DETECTED NOT DETECTED Final   Influenza A NOT DETECTED NOT DETECTED Final   Influenza B NOT DETECTED NOT DETECTED Final   Parainfluenza Virus 1 NOT DETECTED NOT DETECTED Final   Parainfluenza Virus 2 NOT DETECTED NOT DETECTED Final   Parainfluenza Virus 3 NOT DETECTED NOT DETECTED Final  Parainfluenza Virus 4 NOT DETECTED NOT DETECTED Final   Respiratory Syncytial Virus NOT DETECTED NOT DETECTED Final   Bordetella pertussis NOT DETECTED NOT DETECTED Final   Bordetella Parapertussis NOT DETECTED NOT DETECTED Final   Chlamydophila pneumoniae NOT DETECTED NOT DETECTED Final   Mycoplasma pneumoniae NOT DETECTED  NOT DETECTED Final    Comment: Performed at [facility], [place] [address], [place], [place] [address]  Culture, blood (Routine X 2) w Reflex to ID Panel     Status: None   Collection Time: 01/06/23  3:13 AM   Specimen: BLOOD  Result Value Ref Range Status   Specimen Description   Final    BLOOD BLOOD RIGHT HAND Performed at [facility] [address] [place], [place] [address]    Special Requests   Final    BOTTLES DRAWN AEROBIC ONLY Blood Culture adequate volume Performed at [facility] [address], [place], [place] [address]    Culture   Final    NO GROWTH 5 DAYS Performed at [facility], [place] [address] [place], [place] [address]    Report Status 01/11/2023 FINAL  Final  Culture, blood (Routine X 2) w Reflex to ID Panel     Status: None   Collection Time: 01/06/23  3:16 AM   Specimen: BLOOD  Result Value Ref Range Status   Specimen Description   Final    BLOOD BLOOD LEFT HAND Performed at [facility] [address] [place], [place] [address]    Special Requests   Final    BOTTLES DRAWN AEROBIC ONLY Blood Culture adequate volume Performed at [facility] [address], [place], [place] [address]    Culture   Final    NO GROWTH 5 DAYS Performed at [facility], [place] [address] [place], [place] [address]    Report Status 01/11/2023 FINAL  Final    Labs: CBC: Recent Labs  Lab 01/06/23 0313 01/09/23 0412 01/10/23 0411 01/11/23 0815 01/11/23 0927  WBC 16.7* 17.1* 15.1* 12.6* 13.3*  NEUTROABS 11.9* 11.6* 10.3* 8.3* 9.0*  HGB 11.9* 10.8* 11.2* 10.6* 10.9*  HCT 38.4* 33.6* 34.9* 33.4* 33.5*  MCV 85.1 83.6 83.1 83.3 82.9  PLT 343 396 444* 457* [id number]*   Basic Metabolic Panel: Recent Labs  Lab 01/08/23 0350 01/09/23 0412 01/10/23 0411 01/11/23 0815 01/11/23 0927  NA 134* 132* 136 132* 134*  K 3.6 3.8 3.6 3.6 3.7  CL 101 103 101 102 102  CO2 '25 23 24 22 22   '$ GLUCOSE 116* 116* 107* 85 83  BUN <5* 8 <5* 8 8  CREATININE 0.49* 0.35* 0.42* 0.45* 0.50*  CALCIUM 7.6* 7.4* 7.8* 7.8* 8.1*  MG  --  1.5* 2.3 1.9 2.0   Liver Function Tests: Recent Labs  Lab 01/09/23 0412 01/10/23 0411 01/11/23 0815 01/11/23 0927  AST 14* '15 16 15  '$ ALT '13 16 17 16  '$ ALKPHOS 28* 34* 32* 34*  BILITOT 0.4 0.5 0.6 0.5  PROT 4.7* 5.2* 5.3* 5.3*  ALBUMIN 1.9* 2.2* 2.3* 2.3*   CBG: No results for input(s): "GLUCAP" in the last 168 hours.  Discharge time spent: greater than 30 minutes.  Signed: Vernelle Emerald, MD [facility] 01/12/2023

## 2023-01-13 ENCOUNTER — Telehealth: Payer: Self-pay

## 2023-01-13 DIAGNOSIS — G8252 Quadriplegia, C1-C4 incomplete: Secondary | ICD-10-CM | POA: Diagnosis not present

## 2023-01-13 NOTE — Transitions of Care (Post Inpatient/ED Visit) (Signed)
   01/13/2023  Name: Dirck Butch MRN: [id number] DOB: 1966-05-03  Today's TOC FU Call Status: Today's TOC FU Call Status:: Successful TOC FU Call Competed TOC FU Call Complete Date: 01/13/23  Transition Care Management Follow-up Telephone Call Date of Discharge: 01/12/23 Discharge Facility: [facility]) Type of Discharge: Inpatient Admission Primary Inpatient Discharge Diagnosis:: intestional obstruction How have you been since you were released from the hospital?: Better Any questions or concerns?: No  Items Reviewed: Did you receive and understand the discharge instructions provided?: Yes Any new allergies since your discharge?: No Dietary orders reviewed?: No  Home Care and Equipment/Supplies: [place] Ordered?: Yes Name of [place]:: [place]: Do you need assistance with bathing/showering or dressing?: Yes Do you need assistance with meal preparation?: Yes Do you need assistance with eating?: Yes Do you have difficulty maintaining continence: Yes Do you need assistance with getting out of bed/getting out of a chair/moving?: Yes Do you have difficulty managing or taking your medications?: Yes  Folllow up appointments reviewed: PCP Follow-up appointment confirmed?: No MD Provider Line Number:[phone] Given: No Specialist Hospital Follow-up appointment confirmed?: No Reason Specialist Follow-Up Not Confirmed: Patient has Specialist Provider Number and will Call for Appointment Do you need transportation to your follow-up appointment?: No Do you understand care options if your condition(s) worsen?: Yes-patient verbalized understanding    Mickel Fuchs, BSW, [place] Medicaid Team  [phone]

## 2023-01-14 ENCOUNTER — Telehealth: Payer: Self-pay | Admitting: Gastroenterology

## 2023-01-14 DIAGNOSIS — G8252 Quadriplegia, C1-C4 incomplete: Secondary | ICD-10-CM | POA: Diagnosis not present

## 2023-01-14 NOTE — Telephone Encounter (Signed)
Patient requesting transfer of care due to transportation issues to digestive health. Patient was advsied to get procedure records faxed. Patient does not have preference in provider.

## 2023-01-15 DIAGNOSIS — C720 Malignant neoplasm of spinal cord: Secondary | ICD-10-CM | POA: Diagnosis not present

## 2023-01-15 DIAGNOSIS — Z7401 Bed confinement status: Secondary | ICD-10-CM | POA: Diagnosis not present

## 2023-01-15 DIAGNOSIS — R7881 Bacteremia: Secondary | ICD-10-CM | POA: Diagnosis not present

## 2023-01-15 DIAGNOSIS — E43 Unspecified severe protein-calorie malnutrition: Secondary | ICD-10-CM | POA: Diagnosis not present

## 2023-01-15 DIAGNOSIS — U071 COVID-19: Secondary | ICD-10-CM | POA: Diagnosis not present

## 2023-01-15 DIAGNOSIS — Z9181 History of falling: Secondary | ICD-10-CM | POA: Diagnosis not present

## 2023-01-15 DIAGNOSIS — G8252 Quadriplegia, C1-C4 incomplete: Secondary | ICD-10-CM | POA: Diagnosis not present

## 2023-01-15 DIAGNOSIS — L409 Psoriasis, unspecified: Secondary | ICD-10-CM | POA: Diagnosis not present

## 2023-01-15 DIAGNOSIS — E876 Hypokalemia: Secondary | ICD-10-CM | POA: Diagnosis not present

## 2023-01-15 DIAGNOSIS — D72829 Elevated white blood cell count, unspecified: Secondary | ICD-10-CM | POA: Diagnosis not present

## 2023-01-16 DIAGNOSIS — G8252 Quadriplegia, C1-C4 incomplete: Secondary | ICD-10-CM | POA: Diagnosis not present

## 2023-01-17 DIAGNOSIS — G8252 Quadriplegia, C1-C4 incomplete: Secondary | ICD-10-CM | POA: Diagnosis not present

## 2023-01-18 DIAGNOSIS — G8252 Quadriplegia, C1-C4 incomplete: Secondary | ICD-10-CM | POA: Diagnosis not present

## 2023-01-19 DIAGNOSIS — G8252 Quadriplegia, C1-C4 incomplete: Secondary | ICD-10-CM | POA: Diagnosis not present

## 2023-01-20 DIAGNOSIS — G8252 Quadriplegia, C1-C4 incomplete: Secondary | ICD-10-CM | POA: Diagnosis not present

## 2023-01-21 DIAGNOSIS — R6510 Systemic inflammatory response syndrome (SIRS) of non-infectious origin without acute organ dysfunction: Secondary | ICD-10-CM | POA: Diagnosis not present

## 2023-01-21 DIAGNOSIS — E43 Unspecified severe protein-calorie malnutrition: Secondary | ICD-10-CM | POA: Diagnosis not present

## 2023-01-21 DIAGNOSIS — G8252 Quadriplegia, C1-C4 incomplete: Secondary | ICD-10-CM | POA: Diagnosis not present

## 2023-01-21 DIAGNOSIS — K5989 Other specified functional intestinal disorders: Secondary | ICD-10-CM | POA: Diagnosis not present

## 2023-01-21 DIAGNOSIS — D72829 Elevated white blood cell count, unspecified: Secondary | ICD-10-CM | POA: Diagnosis not present

## 2023-01-21 DIAGNOSIS — E876 Hypokalemia: Secondary | ICD-10-CM | POA: Diagnosis not present

## 2023-01-22 DIAGNOSIS — G8252 Quadriplegia, C1-C4 incomplete: Secondary | ICD-10-CM | POA: Diagnosis not present

## 2023-01-23 DIAGNOSIS — G8252 Quadriplegia, C1-C4 incomplete: Secondary | ICD-10-CM | POA: Diagnosis not present

## 2023-01-24 DIAGNOSIS — G8252 Quadriplegia, C1-C4 incomplete: Secondary | ICD-10-CM | POA: Diagnosis not present

## 2023-01-25 DIAGNOSIS — E43 Unspecified severe protein-calorie malnutrition: Secondary | ICD-10-CM | POA: Diagnosis not present

## 2023-01-25 DIAGNOSIS — L409 Psoriasis, unspecified: Secondary | ICD-10-CM | POA: Diagnosis not present

## 2023-01-25 DIAGNOSIS — R7881 Bacteremia: Secondary | ICD-10-CM | POA: Diagnosis not present

## 2023-01-25 DIAGNOSIS — E876 Hypokalemia: Secondary | ICD-10-CM | POA: Diagnosis not present

## 2023-01-25 DIAGNOSIS — Z9181 History of falling: Secondary | ICD-10-CM | POA: Diagnosis not present

## 2023-01-25 DIAGNOSIS — D72829 Elevated white blood cell count, unspecified: Secondary | ICD-10-CM | POA: Diagnosis not present

## 2023-01-25 DIAGNOSIS — C720 Malignant neoplasm of spinal cord: Secondary | ICD-10-CM | POA: Diagnosis not present

## 2023-01-25 DIAGNOSIS — Z7401 Bed confinement status: Secondary | ICD-10-CM | POA: Diagnosis not present

## 2023-01-25 DIAGNOSIS — U071 COVID-19: Secondary | ICD-10-CM | POA: Diagnosis not present

## 2023-01-25 DIAGNOSIS — G8252 Quadriplegia, C1-C4 incomplete: Secondary | ICD-10-CM | POA: Diagnosis not present

## 2023-01-26 DIAGNOSIS — G8252 Quadriplegia, C1-C4 incomplete: Secondary | ICD-10-CM | POA: Diagnosis not present

## 2023-01-27 DIAGNOSIS — G8252 Quadriplegia, C1-C4 incomplete: Secondary | ICD-10-CM | POA: Diagnosis not present

## 2023-01-28 DIAGNOSIS — G8252 Quadriplegia, C1-C4 incomplete: Secondary | ICD-10-CM | POA: Diagnosis not present

## 2023-01-29 DIAGNOSIS — G8252 Quadriplegia, C1-C4 incomplete: Secondary | ICD-10-CM | POA: Diagnosis not present

## 2023-01-30 DIAGNOSIS — G8252 Quadriplegia, C1-C4 incomplete: Secondary | ICD-10-CM | POA: Diagnosis not present

## 2023-01-31 DIAGNOSIS — C720 Malignant neoplasm of spinal cord: Secondary | ICD-10-CM | POA: Diagnosis not present

## 2023-01-31 DIAGNOSIS — Z7401 Bed confinement status: Secondary | ICD-10-CM | POA: Diagnosis not present

## 2023-01-31 DIAGNOSIS — Z9181 History of falling: Secondary | ICD-10-CM | POA: Diagnosis not present

## 2023-01-31 DIAGNOSIS — U071 COVID-19: Secondary | ICD-10-CM | POA: Diagnosis not present

## 2023-01-31 DIAGNOSIS — E43 Unspecified severe protein-calorie malnutrition: Secondary | ICD-10-CM | POA: Diagnosis not present

## 2023-01-31 DIAGNOSIS — E876 Hypokalemia: Secondary | ICD-10-CM | POA: Diagnosis not present

## 2023-01-31 DIAGNOSIS — R7881 Bacteremia: Secondary | ICD-10-CM | POA: Diagnosis not present

## 2023-01-31 DIAGNOSIS — G8252 Quadriplegia, C1-C4 incomplete: Secondary | ICD-10-CM | POA: Diagnosis not present

## 2023-01-31 DIAGNOSIS — L409 Psoriasis, unspecified: Secondary | ICD-10-CM | POA: Diagnosis not present

## 2023-01-31 DIAGNOSIS — D72829 Elevated white blood cell count, unspecified: Secondary | ICD-10-CM | POA: Diagnosis not present

## 2023-02-01 DIAGNOSIS — G8252 Quadriplegia, C1-C4 incomplete: Secondary | ICD-10-CM | POA: Diagnosis not present

## 2023-02-02 DIAGNOSIS — G8252 Quadriplegia, C1-C4 incomplete: Secondary | ICD-10-CM | POA: Diagnosis not present

## 2023-02-03 DIAGNOSIS — Z419 Encounter for procedure for purposes other than remedying health state, unspecified: Secondary | ICD-10-CM | POA: Diagnosis not present

## 2023-02-03 DIAGNOSIS — G8252 Quadriplegia, C1-C4 incomplete: Secondary | ICD-10-CM | POA: Diagnosis not present

## 2023-02-04 DIAGNOSIS — G8252 Quadriplegia, C1-C4 incomplete: Secondary | ICD-10-CM | POA: Diagnosis not present

## 2023-02-05 DIAGNOSIS — L409 Psoriasis, unspecified: Secondary | ICD-10-CM | POA: Diagnosis not present

## 2023-02-05 DIAGNOSIS — G8252 Quadriplegia, C1-C4 incomplete: Secondary | ICD-10-CM | POA: Diagnosis not present

## 2023-02-05 DIAGNOSIS — U071 COVID-19: Secondary | ICD-10-CM | POA: Diagnosis not present

## 2023-02-05 DIAGNOSIS — R7881 Bacteremia: Secondary | ICD-10-CM | POA: Diagnosis not present

## 2023-02-05 DIAGNOSIS — Z7401 Bed confinement status: Secondary | ICD-10-CM | POA: Diagnosis not present

## 2023-02-05 DIAGNOSIS — Z9181 History of falling: Secondary | ICD-10-CM | POA: Diagnosis not present

## 2023-02-05 DIAGNOSIS — E876 Hypokalemia: Secondary | ICD-10-CM | POA: Diagnosis not present

## 2023-02-05 DIAGNOSIS — E43 Unspecified severe protein-calorie malnutrition: Secondary | ICD-10-CM | POA: Diagnosis not present

## 2023-02-05 DIAGNOSIS — C720 Malignant neoplasm of spinal cord: Secondary | ICD-10-CM | POA: Diagnosis not present

## 2023-02-05 DIAGNOSIS — D72829 Elevated white blood cell count, unspecified: Secondary | ICD-10-CM | POA: Diagnosis not present

## 2023-02-06 DIAGNOSIS — G8252 Quadriplegia, C1-C4 incomplete: Secondary | ICD-10-CM | POA: Diagnosis not present

## 2023-02-07 DIAGNOSIS — G8252 Quadriplegia, C1-C4 incomplete: Secondary | ICD-10-CM | POA: Diagnosis not present

## 2023-02-08 DIAGNOSIS — C720 Malignant neoplasm of spinal cord: Secondary | ICD-10-CM | POA: Diagnosis not present

## 2023-02-08 DIAGNOSIS — G8252 Quadriplegia, C1-C4 incomplete: Secondary | ICD-10-CM | POA: Diagnosis not present

## 2023-02-09 DIAGNOSIS — G8252 Quadriplegia, C1-C4 incomplete: Secondary | ICD-10-CM | POA: Diagnosis not present

## 2023-02-10 DIAGNOSIS — G8252 Quadriplegia, C1-C4 incomplete: Secondary | ICD-10-CM | POA: Diagnosis not present

## 2023-02-11 DIAGNOSIS — G8252 Quadriplegia, C1-C4 incomplete: Secondary | ICD-10-CM | POA: Diagnosis not present

## 2023-02-12 DIAGNOSIS — G8252 Quadriplegia, C1-C4 incomplete: Secondary | ICD-10-CM | POA: Diagnosis not present

## 2023-02-14 DIAGNOSIS — Z9181 History of falling: Secondary | ICD-10-CM | POA: Diagnosis not present

## 2023-02-14 DIAGNOSIS — L409 Psoriasis, unspecified: Secondary | ICD-10-CM | POA: Diagnosis not present

## 2023-02-14 DIAGNOSIS — Z7401 Bed confinement status: Secondary | ICD-10-CM | POA: Diagnosis not present

## 2023-02-14 DIAGNOSIS — R7881 Bacteremia: Secondary | ICD-10-CM | POA: Diagnosis not present

## 2023-02-14 DIAGNOSIS — D72829 Elevated white blood cell count, unspecified: Secondary | ICD-10-CM | POA: Diagnosis not present

## 2023-02-14 DIAGNOSIS — G8252 Quadriplegia, C1-C4 incomplete: Secondary | ICD-10-CM | POA: Diagnosis not present

## 2023-02-14 DIAGNOSIS — C720 Malignant neoplasm of spinal cord: Secondary | ICD-10-CM | POA: Diagnosis not present

## 2023-02-14 DIAGNOSIS — E876 Hypokalemia: Secondary | ICD-10-CM | POA: Diagnosis not present

## 2023-02-14 DIAGNOSIS — E43 Unspecified severe protein-calorie malnutrition: Secondary | ICD-10-CM | POA: Diagnosis not present

## 2023-02-14 DIAGNOSIS — U071 COVID-19: Secondary | ICD-10-CM | POA: Diagnosis not present

## 2023-02-18 DIAGNOSIS — G8252 Quadriplegia, C1-C4 incomplete: Secondary | ICD-10-CM | POA: Diagnosis not present

## 2023-02-19 DIAGNOSIS — C720 Malignant neoplasm of spinal cord: Secondary | ICD-10-CM | POA: Diagnosis not present

## 2023-02-19 DIAGNOSIS — E876 Hypokalemia: Secondary | ICD-10-CM | POA: Diagnosis not present

## 2023-02-19 DIAGNOSIS — E43 Unspecified severe protein-calorie malnutrition: Secondary | ICD-10-CM | POA: Diagnosis not present

## 2023-02-19 DIAGNOSIS — R7881 Bacteremia: Secondary | ICD-10-CM | POA: Diagnosis not present

## 2023-02-19 DIAGNOSIS — U071 COVID-19: Secondary | ICD-10-CM | POA: Diagnosis not present

## 2023-02-19 DIAGNOSIS — D72829 Elevated white blood cell count, unspecified: Secondary | ICD-10-CM | POA: Diagnosis not present

## 2023-02-19 DIAGNOSIS — Z7401 Bed confinement status: Secondary | ICD-10-CM | POA: Diagnosis not present

## 2023-02-19 DIAGNOSIS — L409 Psoriasis, unspecified: Secondary | ICD-10-CM | POA: Diagnosis not present

## 2023-02-19 DIAGNOSIS — G8252 Quadriplegia, C1-C4 incomplete: Secondary | ICD-10-CM | POA: Diagnosis not present

## 2023-02-19 DIAGNOSIS — Z9181 History of falling: Secondary | ICD-10-CM | POA: Diagnosis not present

## 2023-02-20 DIAGNOSIS — G8252 Quadriplegia, C1-C4 incomplete: Secondary | ICD-10-CM | POA: Diagnosis not present

## 2023-02-21 DIAGNOSIS — Z9181 History of falling: Secondary | ICD-10-CM | POA: Diagnosis not present

## 2023-02-21 DIAGNOSIS — E876 Hypokalemia: Secondary | ICD-10-CM | POA: Diagnosis not present

## 2023-02-21 DIAGNOSIS — G8252 Quadriplegia, C1-C4 incomplete: Secondary | ICD-10-CM | POA: Diagnosis not present

## 2023-02-21 DIAGNOSIS — R7881 Bacteremia: Secondary | ICD-10-CM | POA: Diagnosis not present

## 2023-02-21 DIAGNOSIS — Z7401 Bed confinement status: Secondary | ICD-10-CM | POA: Diagnosis not present

## 2023-02-21 DIAGNOSIS — C720 Malignant neoplasm of spinal cord: Secondary | ICD-10-CM | POA: Diagnosis not present

## 2023-02-21 DIAGNOSIS — E43 Unspecified severe protein-calorie malnutrition: Secondary | ICD-10-CM | POA: Diagnosis not present

## 2023-02-21 DIAGNOSIS — U071 COVID-19: Secondary | ICD-10-CM | POA: Diagnosis not present

## 2023-02-21 DIAGNOSIS — D72829 Elevated white blood cell count, unspecified: Secondary | ICD-10-CM | POA: Diagnosis not present

## 2023-02-21 DIAGNOSIS — L409 Psoriasis, unspecified: Secondary | ICD-10-CM | POA: Diagnosis not present

## 2023-02-22 DIAGNOSIS — G8252 Quadriplegia, C1-C4 incomplete: Secondary | ICD-10-CM | POA: Diagnosis not present

## 2023-02-23 DIAGNOSIS — G8252 Quadriplegia, C1-C4 incomplete: Secondary | ICD-10-CM | POA: Diagnosis not present

## 2023-02-24 DIAGNOSIS — G8252 Quadriplegia, C1-C4 incomplete: Secondary | ICD-10-CM | POA: Diagnosis not present

## 2023-02-25 DIAGNOSIS — D72829 Elevated white blood cell count, unspecified: Secondary | ICD-10-CM | POA: Diagnosis not present

## 2023-02-25 DIAGNOSIS — R7881 Bacteremia: Secondary | ICD-10-CM | POA: Diagnosis not present

## 2023-02-25 DIAGNOSIS — E876 Hypokalemia: Secondary | ICD-10-CM | POA: Diagnosis not present

## 2023-02-25 DIAGNOSIS — E43 Unspecified severe protein-calorie malnutrition: Secondary | ICD-10-CM | POA: Diagnosis not present

## 2023-02-25 DIAGNOSIS — C720 Malignant neoplasm of spinal cord: Secondary | ICD-10-CM | POA: Diagnosis not present

## 2023-02-25 DIAGNOSIS — Z7401 Bed confinement status: Secondary | ICD-10-CM | POA: Diagnosis not present

## 2023-02-25 DIAGNOSIS — U071 COVID-19: Secondary | ICD-10-CM | POA: Diagnosis not present

## 2023-02-25 DIAGNOSIS — L409 Psoriasis, unspecified: Secondary | ICD-10-CM | POA: Diagnosis not present

## 2023-02-25 DIAGNOSIS — G8252 Quadriplegia, C1-C4 incomplete: Secondary | ICD-10-CM | POA: Diagnosis not present

## 2023-02-25 DIAGNOSIS — Z9181 History of falling: Secondary | ICD-10-CM | POA: Diagnosis not present

## 2023-02-26 DIAGNOSIS — G8252 Quadriplegia, C1-C4 incomplete: Secondary | ICD-10-CM | POA: Diagnosis not present

## 2023-02-27 ENCOUNTER — Telehealth: Payer: Self-pay

## 2023-02-27 DIAGNOSIS — G8252 Quadriplegia, C1-C4 incomplete: Secondary | ICD-10-CM | POA: Diagnosis not present

## 2023-02-27 NOTE — Telephone Encounter (Signed)
..   Medicaid Managed Care   Unsuccessful Outreach Note  02/27/2023 Name: Carlos [name] MRN: [id number] DOB: 03-17-1966  Referred by: Ladora Daniel, PA-C Reason for referral : Appointment   A second unsuccessful telephone outreach was attempted today. The patient was referred to the case management team for assistance with care management and care coordination.   Follow Up Plan: The care management team will reach out to the patient again over the next 7-14 days.   Weston Settle Care Guide  [facility] Managed  [facility]  [phone]

## 2023-02-28 DIAGNOSIS — G8252 Quadriplegia, C1-C4 incomplete: Secondary | ICD-10-CM | POA: Diagnosis not present

## 2023-03-02 DIAGNOSIS — G8252 Quadriplegia, C1-C4 incomplete: Secondary | ICD-10-CM | POA: Diagnosis not present

## 2023-03-03 DIAGNOSIS — G8252 Quadriplegia, C1-C4 incomplete: Secondary | ICD-10-CM | POA: Diagnosis not present

## 2023-03-04 DIAGNOSIS — G8252 Quadriplegia, C1-C4 incomplete: Secondary | ICD-10-CM | POA: Diagnosis not present

## 2023-03-05 DIAGNOSIS — G8252 Quadriplegia, C1-C4 incomplete: Secondary | ICD-10-CM | POA: Diagnosis not present

## 2023-03-05 DIAGNOSIS — Z419 Encounter for procedure for purposes other than remedying health state, unspecified: Secondary | ICD-10-CM | POA: Diagnosis not present

## 2023-03-06 DIAGNOSIS — G8252 Quadriplegia, C1-C4 incomplete: Secondary | ICD-10-CM | POA: Diagnosis not present

## 2023-03-07 DIAGNOSIS — G8252 Quadriplegia, C1-C4 incomplete: Secondary | ICD-10-CM | POA: Diagnosis not present

## 2023-03-08 DIAGNOSIS — G8252 Quadriplegia, C1-C4 incomplete: Secondary | ICD-10-CM | POA: Diagnosis not present

## 2023-03-09 DIAGNOSIS — G8252 Quadriplegia, C1-C4 incomplete: Secondary | ICD-10-CM | POA: Diagnosis not present

## 2023-03-10 DIAGNOSIS — G8252 Quadriplegia, C1-C4 incomplete: Secondary | ICD-10-CM | POA: Diagnosis not present

## 2023-03-10 DIAGNOSIS — C720 Malignant neoplasm of spinal cord: Secondary | ICD-10-CM | POA: Diagnosis not present

## 2023-03-11 DIAGNOSIS — G8252 Quadriplegia, C1-C4 incomplete: Secondary | ICD-10-CM | POA: Diagnosis not present

## 2023-03-12 DIAGNOSIS — G8252 Quadriplegia, C1-C4 incomplete: Secondary | ICD-10-CM | POA: Diagnosis not present

## 2023-03-13 DIAGNOSIS — G8252 Quadriplegia, C1-C4 incomplete: Secondary | ICD-10-CM | POA: Diagnosis not present

## 2023-03-13 DIAGNOSIS — Z7401 Bed confinement status: Secondary | ICD-10-CM | POA: Diagnosis not present

## 2023-03-13 DIAGNOSIS — U071 COVID-19: Secondary | ICD-10-CM | POA: Diagnosis not present

## 2023-03-13 DIAGNOSIS — E43 Unspecified severe protein-calorie malnutrition: Secondary | ICD-10-CM | POA: Diagnosis not present

## 2023-03-13 DIAGNOSIS — E876 Hypokalemia: Secondary | ICD-10-CM | POA: Diagnosis not present

## 2023-03-13 DIAGNOSIS — C720 Malignant neoplasm of spinal cord: Secondary | ICD-10-CM | POA: Diagnosis not present

## 2023-03-13 DIAGNOSIS — Z9181 History of falling: Secondary | ICD-10-CM | POA: Diagnosis not present

## 2023-03-13 DIAGNOSIS — L409 Psoriasis, unspecified: Secondary | ICD-10-CM | POA: Diagnosis not present

## 2023-03-13 DIAGNOSIS — R7881 Bacteremia: Secondary | ICD-10-CM | POA: Diagnosis not present

## 2023-03-13 DIAGNOSIS — D72829 Elevated white blood cell count, unspecified: Secondary | ICD-10-CM | POA: Diagnosis not present

## 2023-03-14 DIAGNOSIS — G8252 Quadriplegia, C1-C4 incomplete: Secondary | ICD-10-CM | POA: Diagnosis not present

## 2023-03-15 DIAGNOSIS — G8252 Quadriplegia, C1-C4 incomplete: Secondary | ICD-10-CM | POA: Diagnosis not present

## 2023-03-16 DIAGNOSIS — G8252 Quadriplegia, C1-C4 incomplete: Secondary | ICD-10-CM | POA: Diagnosis not present

## 2023-03-17 DIAGNOSIS — G8252 Quadriplegia, C1-C4 incomplete: Secondary | ICD-10-CM | POA: Diagnosis not present

## 2023-03-18 DIAGNOSIS — G8252 Quadriplegia, C1-C4 incomplete: Secondary | ICD-10-CM | POA: Diagnosis not present

## 2023-03-19 DIAGNOSIS — G8252 Quadriplegia, C1-C4 incomplete: Secondary | ICD-10-CM | POA: Diagnosis not present

## 2023-03-20 DIAGNOSIS — G8252 Quadriplegia, C1-C4 incomplete: Secondary | ICD-10-CM | POA: Diagnosis not present

## 2023-03-20 DIAGNOSIS — L409 Psoriasis, unspecified: Secondary | ICD-10-CM | POA: Diagnosis not present

## 2023-03-20 DIAGNOSIS — D72829 Elevated white blood cell count, unspecified: Secondary | ICD-10-CM | POA: Diagnosis not present

## 2023-03-20 DIAGNOSIS — Z7401 Bed confinement status: Secondary | ICD-10-CM | POA: Diagnosis not present

## 2023-03-20 DIAGNOSIS — E876 Hypokalemia: Secondary | ICD-10-CM | POA: Diagnosis not present

## 2023-03-20 DIAGNOSIS — C720 Malignant neoplasm of spinal cord: Secondary | ICD-10-CM | POA: Diagnosis not present

## 2023-03-20 DIAGNOSIS — R7881 Bacteremia: Secondary | ICD-10-CM | POA: Diagnosis not present

## 2023-03-20 DIAGNOSIS — Z9181 History of falling: Secondary | ICD-10-CM | POA: Diagnosis not present

## 2023-03-20 DIAGNOSIS — U071 COVID-19: Secondary | ICD-10-CM | POA: Diagnosis not present

## 2023-03-20 DIAGNOSIS — E43 Unspecified severe protein-calorie malnutrition: Secondary | ICD-10-CM | POA: Diagnosis not present

## 2023-03-21 DIAGNOSIS — G8252 Quadriplegia, C1-C4 incomplete: Secondary | ICD-10-CM | POA: Diagnosis not present

## 2023-03-22 DIAGNOSIS — G8252 Quadriplegia, C1-C4 incomplete: Secondary | ICD-10-CM | POA: Diagnosis not present

## 2023-03-23 DIAGNOSIS — C720 Malignant neoplasm of spinal cord: Secondary | ICD-10-CM | POA: Diagnosis not present

## 2023-03-23 DIAGNOSIS — D72829 Elevated white blood cell count, unspecified: Secondary | ICD-10-CM | POA: Diagnosis not present

## 2023-03-23 DIAGNOSIS — G8252 Quadriplegia, C1-C4 incomplete: Secondary | ICD-10-CM | POA: Diagnosis not present

## 2023-03-23 DIAGNOSIS — U071 COVID-19: Secondary | ICD-10-CM | POA: Diagnosis not present

## 2023-03-23 DIAGNOSIS — E43 Unspecified severe protein-calorie malnutrition: Secondary | ICD-10-CM | POA: Diagnosis not present

## 2023-03-23 DIAGNOSIS — Z7401 Bed confinement status: Secondary | ICD-10-CM | POA: Diagnosis not present

## 2023-03-23 DIAGNOSIS — E876 Hypokalemia: Secondary | ICD-10-CM | POA: Diagnosis not present

## 2023-03-23 DIAGNOSIS — R7881 Bacteremia: Secondary | ICD-10-CM | POA: Diagnosis not present

## 2023-03-23 DIAGNOSIS — Z9181 History of falling: Secondary | ICD-10-CM | POA: Diagnosis not present

## 2023-03-23 DIAGNOSIS — L409 Psoriasis, unspecified: Secondary | ICD-10-CM | POA: Diagnosis not present

## 2023-03-24 DIAGNOSIS — G8252 Quadriplegia, C1-C4 incomplete: Secondary | ICD-10-CM | POA: Diagnosis not present

## 2023-03-27 DIAGNOSIS — C720 Malignant neoplasm of spinal cord: Secondary | ICD-10-CM | POA: Diagnosis not present

## 2023-03-27 DIAGNOSIS — G8252 Quadriplegia, C1-C4 incomplete: Secondary | ICD-10-CM | POA: Diagnosis not present

## 2023-03-27 DIAGNOSIS — Z9181 History of falling: Secondary | ICD-10-CM | POA: Diagnosis not present

## 2023-03-27 DIAGNOSIS — E43 Unspecified severe protein-calorie malnutrition: Secondary | ICD-10-CM | POA: Diagnosis not present

## 2023-03-27 DIAGNOSIS — L409 Psoriasis, unspecified: Secondary | ICD-10-CM | POA: Diagnosis not present

## 2023-03-27 DIAGNOSIS — Z7401 Bed confinement status: Secondary | ICD-10-CM | POA: Diagnosis not present

## 2023-03-27 DIAGNOSIS — E876 Hypokalemia: Secondary | ICD-10-CM | POA: Diagnosis not present

## 2023-03-27 DIAGNOSIS — D72829 Elevated white blood cell count, unspecified: Secondary | ICD-10-CM | POA: Diagnosis not present

## 2023-03-27 DIAGNOSIS — U071 COVID-19: Secondary | ICD-10-CM | POA: Diagnosis not present

## 2023-03-27 DIAGNOSIS — R7881 Bacteremia: Secondary | ICD-10-CM | POA: Diagnosis not present

## 2023-03-28 DIAGNOSIS — G8252 Quadriplegia, C1-C4 incomplete: Secondary | ICD-10-CM | POA: Diagnosis not present

## 2023-03-31 DIAGNOSIS — G8252 Quadriplegia, C1-C4 incomplete: Secondary | ICD-10-CM | POA: Diagnosis not present

## 2023-04-01 DIAGNOSIS — G8252 Quadriplegia, C1-C4 incomplete: Secondary | ICD-10-CM | POA: Diagnosis not present

## 2023-04-02 DIAGNOSIS — Z7401 Bed confinement status: Secondary | ICD-10-CM | POA: Diagnosis not present

## 2023-04-02 DIAGNOSIS — Z9181 History of falling: Secondary | ICD-10-CM | POA: Diagnosis not present

## 2023-04-02 DIAGNOSIS — G8252 Quadriplegia, C1-C4 incomplete: Secondary | ICD-10-CM | POA: Diagnosis not present

## 2023-04-02 DIAGNOSIS — L409 Psoriasis, unspecified: Secondary | ICD-10-CM | POA: Diagnosis not present

## 2023-04-02 DIAGNOSIS — E876 Hypokalemia: Secondary | ICD-10-CM | POA: Diagnosis not present

## 2023-04-02 DIAGNOSIS — R7881 Bacteremia: Secondary | ICD-10-CM | POA: Diagnosis not present

## 2023-04-02 DIAGNOSIS — D72829 Elevated white blood cell count, unspecified: Secondary | ICD-10-CM | POA: Diagnosis not present

## 2023-04-02 DIAGNOSIS — E43 Unspecified severe protein-calorie malnutrition: Secondary | ICD-10-CM | POA: Diagnosis not present

## 2023-04-02 DIAGNOSIS — U071 COVID-19: Secondary | ICD-10-CM | POA: Diagnosis not present

## 2023-04-02 DIAGNOSIS — C720 Malignant neoplasm of spinal cord: Secondary | ICD-10-CM | POA: Diagnosis not present

## 2023-04-03 DIAGNOSIS — G8252 Quadriplegia, C1-C4 incomplete: Secondary | ICD-10-CM | POA: Diagnosis not present

## 2023-04-04 DIAGNOSIS — Z7689 Persons encountering health services in other specified circumstances: Secondary | ICD-10-CM | POA: Diagnosis not present

## 2023-04-04 DIAGNOSIS — G8252 Quadriplegia, C1-C4 incomplete: Secondary | ICD-10-CM | POA: Diagnosis not present

## 2023-04-05 DIAGNOSIS — Z419 Encounter for procedure for purposes other than remedying health state, unspecified: Secondary | ICD-10-CM | POA: Diagnosis not present

## 2023-04-07 DIAGNOSIS — G8252 Quadriplegia, C1-C4 incomplete: Secondary | ICD-10-CM | POA: Diagnosis not present

## 2023-04-08 DIAGNOSIS — I517 Cardiomegaly: Secondary | ICD-10-CM | POA: Diagnosis not present

## 2023-04-09 DIAGNOSIS — G8252 Quadriplegia, C1-C4 incomplete: Secondary | ICD-10-CM | POA: Diagnosis not present

## 2023-04-10 DIAGNOSIS — C720 Malignant neoplasm of spinal cord: Secondary | ICD-10-CM | POA: Diagnosis not present

## 2023-04-10 DIAGNOSIS — L409 Psoriasis, unspecified: Secondary | ICD-10-CM | POA: Diagnosis not present

## 2023-04-10 DIAGNOSIS — Z7401 Bed confinement status: Secondary | ICD-10-CM | POA: Diagnosis not present

## 2023-04-10 DIAGNOSIS — U071 COVID-19: Secondary | ICD-10-CM | POA: Diagnosis not present

## 2023-04-10 DIAGNOSIS — E43 Unspecified severe protein-calorie malnutrition: Secondary | ICD-10-CM | POA: Diagnosis not present

## 2023-04-10 DIAGNOSIS — Z9181 History of falling: Secondary | ICD-10-CM | POA: Diagnosis not present

## 2023-04-10 DIAGNOSIS — E876 Hypokalemia: Secondary | ICD-10-CM | POA: Diagnosis not present

## 2023-04-10 DIAGNOSIS — D72829 Elevated white blood cell count, unspecified: Secondary | ICD-10-CM | POA: Diagnosis not present

## 2023-04-10 DIAGNOSIS — G8252 Quadriplegia, C1-C4 incomplete: Secondary | ICD-10-CM | POA: Diagnosis not present

## 2023-04-10 DIAGNOSIS — R7881 Bacteremia: Secondary | ICD-10-CM | POA: Diagnosis not present

## 2023-04-12 DIAGNOSIS — G8252 Quadriplegia, C1-C4 incomplete: Secondary | ICD-10-CM | POA: Diagnosis not present

## 2023-04-13 DIAGNOSIS — G8252 Quadriplegia, C1-C4 incomplete: Secondary | ICD-10-CM | POA: Diagnosis not present

## 2023-04-14 DIAGNOSIS — Z7401 Bed confinement status: Secondary | ICD-10-CM | POA: Diagnosis not present

## 2023-04-14 DIAGNOSIS — E876 Hypokalemia: Secondary | ICD-10-CM | POA: Diagnosis not present

## 2023-04-14 DIAGNOSIS — U071 COVID-19: Secondary | ICD-10-CM | POA: Diagnosis not present

## 2023-04-14 DIAGNOSIS — G8252 Quadriplegia, C1-C4 incomplete: Secondary | ICD-10-CM | POA: Diagnosis not present

## 2023-04-14 DIAGNOSIS — D72829 Elevated white blood cell count, unspecified: Secondary | ICD-10-CM | POA: Diagnosis not present

## 2023-04-14 DIAGNOSIS — L409 Psoriasis, unspecified: Secondary | ICD-10-CM | POA: Diagnosis not present

## 2023-04-14 DIAGNOSIS — Z9181 History of falling: Secondary | ICD-10-CM | POA: Diagnosis not present

## 2023-04-14 DIAGNOSIS — R7881 Bacteremia: Secondary | ICD-10-CM | POA: Diagnosis not present

## 2023-04-14 DIAGNOSIS — E43 Unspecified severe protein-calorie malnutrition: Secondary | ICD-10-CM | POA: Diagnosis not present

## 2023-04-14 DIAGNOSIS — C720 Malignant neoplasm of spinal cord: Secondary | ICD-10-CM | POA: Diagnosis not present

## 2023-04-15 DIAGNOSIS — G8252 Quadriplegia, C1-C4 incomplete: Secondary | ICD-10-CM | POA: Diagnosis not present

## 2023-04-16 DIAGNOSIS — G8252 Quadriplegia, C1-C4 incomplete: Secondary | ICD-10-CM | POA: Diagnosis not present

## 2023-04-17 DIAGNOSIS — G8252 Quadriplegia, C1-C4 incomplete: Secondary | ICD-10-CM | POA: Diagnosis not present

## 2023-04-18 DIAGNOSIS — R7881 Bacteremia: Secondary | ICD-10-CM | POA: Diagnosis not present

## 2023-04-18 DIAGNOSIS — G8252 Quadriplegia, C1-C4 incomplete: Secondary | ICD-10-CM | POA: Diagnosis not present

## 2023-04-18 DIAGNOSIS — E43 Unspecified severe protein-calorie malnutrition: Secondary | ICD-10-CM | POA: Diagnosis not present

## 2023-04-18 DIAGNOSIS — C720 Malignant neoplasm of spinal cord: Secondary | ICD-10-CM | POA: Diagnosis not present

## 2023-04-18 DIAGNOSIS — U071 COVID-19: Secondary | ICD-10-CM | POA: Diagnosis not present

## 2023-04-18 DIAGNOSIS — Z9181 History of falling: Secondary | ICD-10-CM | POA: Diagnosis not present

## 2023-04-18 DIAGNOSIS — L409 Psoriasis, unspecified: Secondary | ICD-10-CM | POA: Diagnosis not present

## 2023-04-18 DIAGNOSIS — Z7401 Bed confinement status: Secondary | ICD-10-CM | POA: Diagnosis not present

## 2023-04-18 DIAGNOSIS — D72829 Elevated white blood cell count, unspecified: Secondary | ICD-10-CM | POA: Diagnosis not present

## 2023-04-18 DIAGNOSIS — E876 Hypokalemia: Secondary | ICD-10-CM | POA: Diagnosis not present

## 2023-04-19 DIAGNOSIS — G8252 Quadriplegia, C1-C4 incomplete: Secondary | ICD-10-CM | POA: Diagnosis not present

## 2023-04-20 DIAGNOSIS — G8252 Quadriplegia, C1-C4 incomplete: Secondary | ICD-10-CM | POA: Diagnosis not present

## 2023-04-21 DIAGNOSIS — L409 Psoriasis, unspecified: Secondary | ICD-10-CM | POA: Diagnosis not present

## 2023-04-21 DIAGNOSIS — C720 Malignant neoplasm of spinal cord: Secondary | ICD-10-CM | POA: Diagnosis not present

## 2023-04-21 DIAGNOSIS — U071 COVID-19: Secondary | ICD-10-CM | POA: Diagnosis not present

## 2023-04-21 DIAGNOSIS — Z9181 History of falling: Secondary | ICD-10-CM | POA: Diagnosis not present

## 2023-04-21 DIAGNOSIS — G8252 Quadriplegia, C1-C4 incomplete: Secondary | ICD-10-CM | POA: Diagnosis not present

## 2023-04-21 DIAGNOSIS — Z7401 Bed confinement status: Secondary | ICD-10-CM | POA: Diagnosis not present

## 2023-04-21 DIAGNOSIS — E876 Hypokalemia: Secondary | ICD-10-CM | POA: Diagnosis not present

## 2023-04-21 DIAGNOSIS — E43 Unspecified severe protein-calorie malnutrition: Secondary | ICD-10-CM | POA: Diagnosis not present

## 2023-04-21 DIAGNOSIS — D72829 Elevated white blood cell count, unspecified: Secondary | ICD-10-CM | POA: Diagnosis not present

## 2023-04-21 DIAGNOSIS — R7881 Bacteremia: Secondary | ICD-10-CM | POA: Diagnosis not present

## 2023-04-22 DIAGNOSIS — G8252 Quadriplegia, C1-C4 incomplete: Secondary | ICD-10-CM | POA: Diagnosis not present

## 2023-04-23 DIAGNOSIS — G8252 Quadriplegia, C1-C4 incomplete: Secondary | ICD-10-CM | POA: Diagnosis not present

## 2023-04-24 DIAGNOSIS — L409 Psoriasis, unspecified: Secondary | ICD-10-CM | POA: Diagnosis not present

## 2023-04-24 DIAGNOSIS — U071 COVID-19: Secondary | ICD-10-CM | POA: Diagnosis not present

## 2023-04-24 DIAGNOSIS — E876 Hypokalemia: Secondary | ICD-10-CM | POA: Diagnosis not present

## 2023-04-24 DIAGNOSIS — E43 Unspecified severe protein-calorie malnutrition: Secondary | ICD-10-CM | POA: Diagnosis not present

## 2023-04-24 DIAGNOSIS — Z7401 Bed confinement status: Secondary | ICD-10-CM | POA: Diagnosis not present

## 2023-04-24 DIAGNOSIS — G8252 Quadriplegia, C1-C4 incomplete: Secondary | ICD-10-CM | POA: Diagnosis not present

## 2023-04-24 DIAGNOSIS — D72829 Elevated white blood cell count, unspecified: Secondary | ICD-10-CM | POA: Diagnosis not present

## 2023-04-24 DIAGNOSIS — R7881 Bacteremia: Secondary | ICD-10-CM | POA: Diagnosis not present

## 2023-04-24 DIAGNOSIS — C720 Malignant neoplasm of spinal cord: Secondary | ICD-10-CM | POA: Diagnosis not present

## 2023-04-24 DIAGNOSIS — Z9181 History of falling: Secondary | ICD-10-CM | POA: Diagnosis not present

## 2023-04-25 DIAGNOSIS — G8252 Quadriplegia, C1-C4 incomplete: Secondary | ICD-10-CM | POA: Diagnosis not present

## 2023-04-28 DIAGNOSIS — G8252 Quadriplegia, C1-C4 incomplete: Secondary | ICD-10-CM | POA: Diagnosis not present

## 2023-04-29 DIAGNOSIS — G8252 Quadriplegia, C1-C4 incomplete: Secondary | ICD-10-CM | POA: Diagnosis not present

## 2023-04-30 DIAGNOSIS — U071 COVID-19: Secondary | ICD-10-CM | POA: Diagnosis not present

## 2023-04-30 DIAGNOSIS — D72829 Elevated white blood cell count, unspecified: Secondary | ICD-10-CM | POA: Diagnosis not present

## 2023-04-30 DIAGNOSIS — E876 Hypokalemia: Secondary | ICD-10-CM | POA: Diagnosis not present

## 2023-04-30 DIAGNOSIS — Z7401 Bed confinement status: Secondary | ICD-10-CM | POA: Diagnosis not present

## 2023-04-30 DIAGNOSIS — L409 Psoriasis, unspecified: Secondary | ICD-10-CM | POA: Diagnosis not present

## 2023-04-30 DIAGNOSIS — E43 Unspecified severe protein-calorie malnutrition: Secondary | ICD-10-CM | POA: Diagnosis not present

## 2023-04-30 DIAGNOSIS — G8252 Quadriplegia, C1-C4 incomplete: Secondary | ICD-10-CM | POA: Diagnosis not present

## 2023-04-30 DIAGNOSIS — Z9181 History of falling: Secondary | ICD-10-CM | POA: Diagnosis not present

## 2023-04-30 DIAGNOSIS — C720 Malignant neoplasm of spinal cord: Secondary | ICD-10-CM | POA: Diagnosis not present

## 2023-04-30 DIAGNOSIS — R7881 Bacteremia: Secondary | ICD-10-CM | POA: Diagnosis not present

## 2023-05-01 DIAGNOSIS — E876 Hypokalemia: Secondary | ICD-10-CM | POA: Diagnosis not present

## 2023-05-01 DIAGNOSIS — D72829 Elevated white blood cell count, unspecified: Secondary | ICD-10-CM | POA: Diagnosis not present

## 2023-05-01 DIAGNOSIS — C720 Malignant neoplasm of spinal cord: Secondary | ICD-10-CM | POA: Diagnosis not present

## 2023-05-01 DIAGNOSIS — R7881 Bacteremia: Secondary | ICD-10-CM | POA: Diagnosis not present

## 2023-05-01 DIAGNOSIS — E43 Unspecified severe protein-calorie malnutrition: Secondary | ICD-10-CM | POA: Diagnosis not present

## 2023-05-01 DIAGNOSIS — L409 Psoriasis, unspecified: Secondary | ICD-10-CM | POA: Diagnosis not present

## 2023-05-01 DIAGNOSIS — Z9181 History of falling: Secondary | ICD-10-CM | POA: Diagnosis not present

## 2023-05-01 DIAGNOSIS — Z7401 Bed confinement status: Secondary | ICD-10-CM | POA: Diagnosis not present

## 2023-05-01 DIAGNOSIS — U071 COVID-19: Secondary | ICD-10-CM | POA: Diagnosis not present

## 2023-05-02 DIAGNOSIS — G8252 Quadriplegia, C1-C4 incomplete: Secondary | ICD-10-CM | POA: Diagnosis not present

## 2023-05-03 DIAGNOSIS — G8252 Quadriplegia, C1-C4 incomplete: Secondary | ICD-10-CM | POA: Diagnosis not present

## 2023-05-04 DIAGNOSIS — G8252 Quadriplegia, C1-C4 incomplete: Secondary | ICD-10-CM | POA: Diagnosis not present

## 2023-05-05 DIAGNOSIS — Z419 Encounter for procedure for purposes other than remedying health state, unspecified: Secondary | ICD-10-CM | POA: Diagnosis not present

## 2023-05-06 DIAGNOSIS — Z7401 Bed confinement status: Secondary | ICD-10-CM | POA: Diagnosis not present

## 2023-05-06 DIAGNOSIS — Z9181 History of falling: Secondary | ICD-10-CM | POA: Diagnosis not present

## 2023-05-06 DIAGNOSIS — E876 Hypokalemia: Secondary | ICD-10-CM | POA: Diagnosis not present

## 2023-05-06 DIAGNOSIS — D72829 Elevated white blood cell count, unspecified: Secondary | ICD-10-CM | POA: Diagnosis not present

## 2023-05-06 DIAGNOSIS — C720 Malignant neoplasm of spinal cord: Secondary | ICD-10-CM | POA: Diagnosis not present

## 2023-05-06 DIAGNOSIS — E43 Unspecified severe protein-calorie malnutrition: Secondary | ICD-10-CM | POA: Diagnosis not present

## 2023-05-06 DIAGNOSIS — L409 Psoriasis, unspecified: Secondary | ICD-10-CM | POA: Diagnosis not present

## 2023-05-06 DIAGNOSIS — R7881 Bacteremia: Secondary | ICD-10-CM | POA: Diagnosis not present

## 2023-05-06 DIAGNOSIS — U071 COVID-19: Secondary | ICD-10-CM | POA: Diagnosis not present

## 2023-05-07 DIAGNOSIS — E876 Hypokalemia: Secondary | ICD-10-CM | POA: Diagnosis not present

## 2023-05-07 DIAGNOSIS — U071 COVID-19: Secondary | ICD-10-CM | POA: Diagnosis not present

## 2023-05-07 DIAGNOSIS — Z9181 History of falling: Secondary | ICD-10-CM | POA: Diagnosis not present

## 2023-05-07 DIAGNOSIS — E43 Unspecified severe protein-calorie malnutrition: Secondary | ICD-10-CM | POA: Diagnosis not present

## 2023-05-07 DIAGNOSIS — D72829 Elevated white blood cell count, unspecified: Secondary | ICD-10-CM | POA: Diagnosis not present

## 2023-05-07 DIAGNOSIS — L409 Psoriasis, unspecified: Secondary | ICD-10-CM | POA: Diagnosis not present

## 2023-05-07 DIAGNOSIS — C720 Malignant neoplasm of spinal cord: Secondary | ICD-10-CM | POA: Diagnosis not present

## 2023-05-07 DIAGNOSIS — Z7401 Bed confinement status: Secondary | ICD-10-CM | POA: Diagnosis not present

## 2023-05-07 DIAGNOSIS — R7881 Bacteremia: Secondary | ICD-10-CM | POA: Diagnosis not present

## 2023-05-10 DIAGNOSIS — C720 Malignant neoplasm of spinal cord: Secondary | ICD-10-CM | POA: Diagnosis not present

## 2023-05-12 DIAGNOSIS — U071 COVID-19: Secondary | ICD-10-CM | POA: Diagnosis not present

## 2023-05-12 DIAGNOSIS — Z9181 History of falling: Secondary | ICD-10-CM | POA: Diagnosis not present

## 2023-05-12 DIAGNOSIS — C720 Malignant neoplasm of spinal cord: Secondary | ICD-10-CM | POA: Diagnosis not present

## 2023-05-12 DIAGNOSIS — R7881 Bacteremia: Secondary | ICD-10-CM | POA: Diagnosis not present

## 2023-05-12 DIAGNOSIS — Z7401 Bed confinement status: Secondary | ICD-10-CM | POA: Diagnosis not present

## 2023-05-12 DIAGNOSIS — L409 Psoriasis, unspecified: Secondary | ICD-10-CM | POA: Diagnosis not present

## 2023-05-12 DIAGNOSIS — E43 Unspecified severe protein-calorie malnutrition: Secondary | ICD-10-CM | POA: Diagnosis not present

## 2023-05-12 DIAGNOSIS — D72829 Elevated white blood cell count, unspecified: Secondary | ICD-10-CM | POA: Diagnosis not present

## 2023-05-12 DIAGNOSIS — E876 Hypokalemia: Secondary | ICD-10-CM | POA: Diagnosis not present

## 2023-05-13 DIAGNOSIS — E876 Hypokalemia: Secondary | ICD-10-CM | POA: Diagnosis not present

## 2023-05-13 DIAGNOSIS — R7881 Bacteremia: Secondary | ICD-10-CM | POA: Diagnosis not present

## 2023-05-13 DIAGNOSIS — D72829 Elevated white blood cell count, unspecified: Secondary | ICD-10-CM | POA: Diagnosis not present

## 2023-05-13 DIAGNOSIS — E43 Unspecified severe protein-calorie malnutrition: Secondary | ICD-10-CM | POA: Diagnosis not present

## 2023-05-13 DIAGNOSIS — Z7401 Bed confinement status: Secondary | ICD-10-CM | POA: Diagnosis not present

## 2023-05-13 DIAGNOSIS — Z9181 History of falling: Secondary | ICD-10-CM | POA: Diagnosis not present

## 2023-05-13 DIAGNOSIS — C720 Malignant neoplasm of spinal cord: Secondary | ICD-10-CM | POA: Diagnosis not present

## 2023-05-13 DIAGNOSIS — U071 COVID-19: Secondary | ICD-10-CM | POA: Diagnosis not present

## 2023-05-13 DIAGNOSIS — L409 Psoriasis, unspecified: Secondary | ICD-10-CM | POA: Diagnosis not present

## 2023-05-14 DIAGNOSIS — Z7401 Bed confinement status: Secondary | ICD-10-CM | POA: Diagnosis not present

## 2023-05-14 DIAGNOSIS — U071 COVID-19: Secondary | ICD-10-CM | POA: Diagnosis not present

## 2023-05-14 DIAGNOSIS — E43 Unspecified severe protein-calorie malnutrition: Secondary | ICD-10-CM | POA: Diagnosis not present

## 2023-05-14 DIAGNOSIS — R7881 Bacteremia: Secondary | ICD-10-CM | POA: Diagnosis not present

## 2023-05-14 DIAGNOSIS — D72829 Elevated white blood cell count, unspecified: Secondary | ICD-10-CM | POA: Diagnosis not present

## 2023-05-14 DIAGNOSIS — C720 Malignant neoplasm of spinal cord: Secondary | ICD-10-CM | POA: Diagnosis not present

## 2023-05-14 DIAGNOSIS — L409 Psoriasis, unspecified: Secondary | ICD-10-CM | POA: Diagnosis not present

## 2023-05-14 DIAGNOSIS — G8252 Quadriplegia, C1-C4 incomplete: Secondary | ICD-10-CM | POA: Diagnosis not present

## 2023-05-14 DIAGNOSIS — Z9181 History of falling: Secondary | ICD-10-CM | POA: Diagnosis not present

## 2023-05-14 DIAGNOSIS — E876 Hypokalemia: Secondary | ICD-10-CM | POA: Diagnosis not present

## 2023-05-15 DIAGNOSIS — G8252 Quadriplegia, C1-C4 incomplete: Secondary | ICD-10-CM | POA: Diagnosis not present

## 2023-05-16 DIAGNOSIS — G8252 Quadriplegia, C1-C4 incomplete: Secondary | ICD-10-CM | POA: Diagnosis not present

## 2023-05-17 DIAGNOSIS — G8252 Quadriplegia, C1-C4 incomplete: Secondary | ICD-10-CM | POA: Diagnosis not present

## 2023-05-18 DIAGNOSIS — G8252 Quadriplegia, C1-C4 incomplete: Secondary | ICD-10-CM | POA: Diagnosis not present

## 2023-05-19 DIAGNOSIS — G8252 Quadriplegia, C1-C4 incomplete: Secondary | ICD-10-CM | POA: Diagnosis not present

## 2023-05-20 DIAGNOSIS — G8252 Quadriplegia, C1-C4 incomplete: Secondary | ICD-10-CM | POA: Diagnosis not present

## 2023-05-21 DIAGNOSIS — G8252 Quadriplegia, C1-C4 incomplete: Secondary | ICD-10-CM | POA: Diagnosis not present

## 2023-05-22 DIAGNOSIS — G8252 Quadriplegia, C1-C4 incomplete: Secondary | ICD-10-CM | POA: Diagnosis not present

## 2023-05-23 DIAGNOSIS — G8252 Quadriplegia, C1-C4 incomplete: Secondary | ICD-10-CM | POA: Diagnosis not present

## 2023-05-24 DIAGNOSIS — G8252 Quadriplegia, C1-C4 incomplete: Secondary | ICD-10-CM | POA: Diagnosis not present

## 2023-05-25 DIAGNOSIS — G8252 Quadriplegia, C1-C4 incomplete: Secondary | ICD-10-CM | POA: Diagnosis not present

## 2023-05-26 DIAGNOSIS — G8252 Quadriplegia, C1-C4 incomplete: Secondary | ICD-10-CM | POA: Diagnosis not present

## 2023-05-27 DIAGNOSIS — G8252 Quadriplegia, C1-C4 incomplete: Secondary | ICD-10-CM | POA: Diagnosis not present

## 2023-05-29 DIAGNOSIS — G8252 Quadriplegia, C1-C4 incomplete: Secondary | ICD-10-CM | POA: Diagnosis not present

## 2023-05-30 DIAGNOSIS — G8252 Quadriplegia, C1-C4 incomplete: Secondary | ICD-10-CM | POA: Diagnosis not present

## 2023-05-31 DIAGNOSIS — G8252 Quadriplegia, C1-C4 incomplete: Secondary | ICD-10-CM | POA: Diagnosis not present

## 2023-06-01 DIAGNOSIS — G8252 Quadriplegia, C1-C4 incomplete: Secondary | ICD-10-CM | POA: Diagnosis not present

## 2023-06-02 DIAGNOSIS — G8252 Quadriplegia, C1-C4 incomplete: Secondary | ICD-10-CM | POA: Diagnosis not present

## 2023-06-03 DIAGNOSIS — G8252 Quadriplegia, C1-C4 incomplete: Secondary | ICD-10-CM | POA: Diagnosis not present

## 2023-06-04 DIAGNOSIS — G8252 Quadriplegia, C1-C4 incomplete: Secondary | ICD-10-CM | POA: Diagnosis not present

## 2023-06-05 DIAGNOSIS — G8252 Quadriplegia, C1-C4 incomplete: Secondary | ICD-10-CM | POA: Diagnosis not present

## 2023-06-06 DIAGNOSIS — G8252 Quadriplegia, C1-C4 incomplete: Secondary | ICD-10-CM | POA: Diagnosis not present

## 2023-06-07 DIAGNOSIS — G8252 Quadriplegia, C1-C4 incomplete: Secondary | ICD-10-CM | POA: Diagnosis not present

## 2023-06-08 DIAGNOSIS — G8252 Quadriplegia, C1-C4 incomplete: Secondary | ICD-10-CM | POA: Diagnosis not present

## 2023-11-25 ENCOUNTER — Encounter (HOSPITAL_BASED_OUTPATIENT_CLINIC_OR_DEPARTMENT_OTHER): Payer: Medicare Other | Attending: General Surgery | Admitting: General Surgery

## 2023-11-25 DIAGNOSIS — G8250 Quadriplegia, unspecified: Secondary | ICD-10-CM | POA: Diagnosis not present

## 2023-11-25 DIAGNOSIS — L98492 Non-pressure chronic ulcer of skin of other sites with fat layer exposed: Secondary | ICD-10-CM | POA: Insufficient documentation

## 2023-11-25 DIAGNOSIS — E43 Unspecified severe protein-calorie malnutrition: Secondary | ICD-10-CM | POA: Insufficient documentation

## 2023-11-25 NOTE — Progress Notes (Signed)
ADRON, CURY ([id number]) [id number] Page 1 of 7 Visit Report for 11/25/2023 Allergy List Details Patient Name: Date of Service: OKOYE, HAGUE 11/25/2023 9:00 A M Medical Record Number: [id number] Patient Account Number: [id number] Date of Birth/Sex: Treating RN: Feb 02, 1966 (58 y.o. [name] Primary Care Uziah Sorter: Ladora Daniel Other Clinician: Referring Louisa Favaro: Treating Asahel Risden/Extender: Daniel Nones Weeks in Treatment: 0 Allergies Active Allergies Sulfa (Sulfonamide Antibiotics) Reaction: hives baclofen Reaction: hives Allergy Notes Electronic Signature(s) Signed: 11/25/2023 4:35:06 PM By: Zenaida Deed RN, BSN Entered By: Zenaida Deed on 11/24/2023 16:46:24 -------------------------------------------------------------------------------- Arrival Information Details Patient Name: Date of Service: [name], Marque 11/25/2023 9:00 A M Medical Record Number: [id number] Patient Account Number: [id number] Date of Birth/Sex: Treating RN: 07/16/1966 (58 y.o. [name] Primary Care Coltin Casher: Ladora Daniel Other Clinician: Referring Icie Kuznicki: Treating Brand Siever/Extender: Dorthula Nettles in Treatment: 0 Visit Information Patient Arrived: Wheel Chair Arrival Time: 08:52 Accompanied By: mother and son Transfer Assistance: None Patient Identification Verified: Yes Secondary Verification Process Completed: Yes Patient Requires Transmission-Based Precautions: No Patient Has Alerts: No Notes stayed in wheelchair Electronic Signature(s) Signed: 11/25/2023 4:35:06 PM By: Zenaida Deed RN, BSN Entered By: Zenaida Deed on 11/25/2023 08:57:05 Cresenciano Lick ([id number]) [id number] Page 2 of 7 -------------------------------------------------------------------------------- Encounter Discharge Information Details Patient Name: Date of Service: DUVAL, WINCH 11/25/2023 9:00 A M Medical  Record Number: [id number] Patient Account Number: [id number] Date of Birth/Sex: Treating RN: [birth year] (58 y.o. [name] Primary Care Reegan Mctighe: Ladora Daniel Other Clinician: Referring Mohsin Crum: Treating Aashish Hamm/Extender: Daniel Nones Weeks in Treatment: 0 Encounter Discharge Information Items Post Procedure Vitals Discharge Condition: Stable Temperature (F): 98.9 Ambulatory Status: Wheelchair Pulse (bpm): 77 Discharge Destination: Home Respiratory Rate (breaths/min): 18 Transportation: Other Blood Pressure (mmHg): 126/72 Accompanied By: mother and son Schedule Follow-up Appointment: Yes Clinical Summary of Care: Patient Declined Notes SCAT Electronic Signature(s) Signed: 11/25/2023 4:35:06 PM By: Zenaida Deed RN, BSN Entered By: Zenaida Deed on 11/25/2023 09:55:27 -------------------------------------------------------------------------------- Lower Extremity Assessment Details Patient Name: Date of Service: [name], Daryle 11/25/2023 9:00 A M Medical Record Number: [id number] Patient Account Number: [id number] Date of Birth/Sex: Treating RN: 1965/11/30 (58 y.o. [name] Primary Care Beila Purdie: Ladora Daniel Other Clinician: Referring Marchello Rothgeb: Treating Corin Formisano/Extender: Daniel Nones Weeks in Treatment: 0 Vascular Assessment Extremity colors, hair growth, and conditions: Extremity Color: [Left:Normal] Hair Growth on Extremity: [Left:No Warm] Electronic Signature(s) Signed: 11/25/2023 4:35:06 PM By: Zenaida Deed RN, BSN Entered By: Zenaida Deed on 11/25/2023 09:15:57 -------------------------------------------------------------------------------- Multi Wound Chart Details Patient Name: Date of Service: Normand Sloop, Darrell 11/25/2023 9:00 A M Medical Record Number: [id number] Patient Account Number: [id number] Date of Birth/Sex: Treating RN: 1966/02/20 (58 y.o. M) Primary Care Dajahnae Vondra: Ladora Daniel Other Clinician: Cresenciano Lick ([id number]) [id number] Page 3 of 7 Referring Bartlett Enke: Treating Ky Moskowitz/Extender: Dorthula Nettles in Treatment: 0 Vital Signs Height(in): Pulse(bpm): 77 Weight(lbs): Blood Pressure(mmHg): 126/72 Body Mass Index(BMI): Temperature(F): 98.9 Respiratory Rate(breaths/min): 18 [1:Photos:] [N/A:N/A] Left Knee N/A N/A Wound Location: Gradually Appeared N/A N/A Wounding Event: Abrasion N/A N/A Primary Etiology: Quadriplegia N/A N/A Comorbid History: 04/05/2022 N/A N/A Date Acquired: 0 N/A N/A Weeks of Treatment: Open N/A N/A Wound Status: No N/A N/A Wound Recurrence: 4.4x1.3x0.1 N/A N/A Measurements L x W x D (cm) 4.492 N/A N/A A (cm) : rea 0.449 N/A N/A Volume (cm) : Full Thickness Without Exposed N/A N/A Classification: Support Structures Medium N/A N/A Exudate A mount: Serosanguineous N/A N/A Exudate Type: red, brown N/A N/A Exudate  Color: Flat and Intact N/A N/A Wound Margin: Large (67-100%) N/A N/A Granulation A mount: Red, Hyper-granulation, Friable N/A N/A Granulation Quality: Small (1-33%) N/A N/A Necrotic A mount: Eschar N/A N/A Necrotic Tissue: Fat Layer (Subcutaneous Tissue): Yes N/A N/A Exposed Structures: Fascia: No Tendon: No Muscle: No Joint: No Bone: No Small (1-33%) N/A N/A Epithelialization: Debridement - Excisional N/A N/A Debridement: Pre-procedure Verification/Time Out 09:30 N/A N/A Taken: Lidocaine 4% T opical Solution N/A N/A Pain Control: Subcutaneous, Slough N/A N/A Tissue Debrided: Skin/Subcutaneous Tissue N/A N/A Level: 4.49 N/A N/A Debridement A (sq cm): rea Curette N/A N/A Instrument: Minimum N/A N/A Bleeding: Silver Nitrate N/A N/A Hemostasis A chieved: 4 N/A N/A Procedural Pain: 2 N/A N/A Post Procedural Pain: Procedure was tolerated well N/A N/A Debridement Treatment Response: 4.4x1.3x0.1 N/A N/A Post Debridement Measurements L x W x D (cm) 0.449 N/A  N/A Post Debridement Volume: (cm) Scarring: Yes N/A N/A Periwound Skin Texture: No Abnormalities Noted N/A N/A Periwound Skin Moisture: No Abnormalities Noted N/A N/A Periwound Skin Color: No Abnormality N/A N/A Temperature: Yes N/A N/A Tenderness on Palpation: Debridement N/A N/A Procedures Performed: Treatment Notes Electronic Signature(s) Signed: 11/25/2023 9:43:24 AM By: Duanne Guess MD FACS Entered By: Duanne Guess on 11/25/2023 09:43:24 Cresenciano Lick ([id number]) [id number] Page 4 of 7 -------------------------------------------------------------------------------- Multi-Disciplinary Care Plan Details Patient Name: Date of Service: ALON, MANZANO 11/25/2023 9:00 A M Medical Record Number: [id number] Patient Account Number: [id number] Date of Birth/Sex: Treating RN: 07-Jun-1966 (58 y.o. [name] Primary Care Shirleyann Montero: Ladora Daniel Other Clinician: Referring Michael Ventresca: Treating Zerenity Bowron/Extender: Dorthula Nettles in Treatment: 0 Multidisciplinary Care Plan reviewed with physician Active Inactive Wound/Skin Impairment Nursing Diagnoses: Impaired tissue integrity Knowledge deficit related to ulceration/compromised skin integrity Goals: Patient/caregiver will verbalize understanding of skin care regimen Date Initiated: 11/25/2023 Target Resolution Date: 12/23/2023 Goal Status: Active Ulcer/skin breakdown will have a volume reduction of 30% by week 4 Date Initiated: 11/25/2023 Target Resolution Date: 12/23/2023 Goal Status: Active Interventions: Assess patient/caregiver ability to obtain necessary supplies Assess patient/caregiver ability to perform ulcer/skin care regimen upon admission and as needed Assess ulceration(s) every visit Provide education on ulcer and skin care Treatment Activities: Skin care regimen initiated : 11/25/2023 Topical wound management initiated : 11/25/2023 Notes: Electronic  Signature(s) Signed: 11/25/2023 4:35:06 PM By: Zenaida Deed RN, BSN Entered By: Zenaida Deed on 11/25/2023 09:35:16 -------------------------------------------------------------------------------- Pain Assessment Details Patient Name: Date of Service: Normand Sloop, Eladio 11/25/2023 9:00 A M Medical Record Number: [id number] Patient Account Number: [id number] Date of Birth/Sex: Treating RN: 01/05/1966 (58 y.o. M) Primary Care Erie Sica: Ladora Daniel Other Clinician: Referring Rhythm Wigfall: Treating Bernarda Erck/Extender: Daniel Nones Weeks in Treatment: 0 Active Problems Location of Pain Severity and Description of Pain Patient Has Paino No Site Locations Rate the pain. ESLI, LANMAN ([id number]) [id number] Page 5 of 7 Rate the pain. Current Pain Level: 0 Character of Pain Describe the Pain: Tender, Other: Stinging Pain Management and Medication Current Pain Management: Electronic Signature(s) Signed: 11/25/2023 10:40:19 AM By: Dayton Scrape Entered By: Dayton Scrape on 11/25/2023 09:24:29 -------------------------------------------------------------------------------- Patient/Caregiver Education Details Patient Name: Date of Service: [name], Daud 1/21/2025andnbsp9:00 A M Medical Record Number: [id number] Patient Account Number: [id number] Date of Birth/Gender: Treating RN: 06-07-1966 (58 y.o. [name] Primary Care Physician: Ladora Daniel Other Clinician: Referring Physician: Treating Physician/Extender: Dorthula Nettles in Treatment: 0 Education Assessment Education Provided To: Patient Education Topics Provided Welcome [facility] Packet: o Handouts: [facility] o Methods: Explain/Verbal, Printed Responses: Reinforcements needed, State content correctly Wound/Skin Impairment: Handouts: Caring for Your Ulcer Methods: Explain/Verbal, Printed Responses: Reinforcements  needed, State content correctly Electronic Signature(s) Signed: 11/25/2023 4:35:06 PM By: Zenaida Deed RN, BSN Entered By: Zenaida Deed on 11/25/2023 09:36:24 Cresenciano Lick ([id number]) [id number] Page 6 of 7 -------------------------------------------------------------------------------- Wound Assessment Details Patient Name: Date of Service: YEHUDA, OKUBO 11/25/2023 9:00 A M Medical Record Number: [id number] Patient Account Number: [id number] Date of Birth/Sex: Treating RN: 02/03/1966 (58 y.o. M) Primary Care Anthonyjames Bargar: Ladora Daniel Other Clinician: Referring Austan Nicholl: Treating Revia Nghiem/Extender: Daniel Nones Weeks in Treatment: 0 Wound Status Wound Number: 1 Primary Etiology: Abrasion Wound Location: Left Knee Wound Status: Open Wounding Event: Gradually Appeared Comorbid History: Quadriplegia Date Acquired: 04/05/2022 Weeks Of Treatment: 0 Clustered Wound: No Photos Wound Measurements Length: (cm) 4.4 Width: (cm) 1.3 Depth: (cm) 0.1 Area: (cm) 4.492 Volume: (cm) 0.449 % Reduction in Area: % Reduction in Volume: Epithelialization: Small (1-33%) Tunneling: No Undermining: No Wound Description Classification: Full Thickness Without Exposed Suppor Wound Margin: Flat and Intact Exudate Amount: Medium Exudate Type: Serosanguineous Exudate Color: red, brown t Structures Foul Odor After Cleansing: No Slough/Fibrino Yes Wound Bed Granulation Amount: Large (67-100%) Exposed Structure Granulation Quality: Red, Hyper-granulation, Friable Fascia Exposed: No Necrotic Amount: Small (1-33%) Fat Layer (Subcutaneous Tissue) Exposed: Yes Necrotic Quality: Eschar Tendon Exposed: No Muscle Exposed: No Joint Exposed: No Bone Exposed: No Periwound Skin Texture Texture Color No Abnormalities Noted: No No Abnormalities Noted: Yes Scarring: Yes Temperature / Pain Temperature: No Abnormality Moisture No Abnormalities Noted:  Yes Tenderness on Palpation: Yes Treatment Notes Wound #1 (Knee) Wound Laterality: Left Cleanser [place] Ancillary Kit - [address] [place], Dorene Sorrow ([id number]) [id number] Page 7 of 7 Discharge Instruction: Use supplies as instructed; Kit contains: (15) Saline Bullets; (15) 3x3 Gauze; 15 pr Gloves Peri-Wound Care Topical Primary Dressing Hydrofera Blue Ready Transfer Foam, 2.5x2.5 (in/in) Discharge Instruction: Apply directly to wound bed as directed Secondary Dressing Zetuvit Plus Silicone Border Dressing 5x5 (in/in) Discharge Instruction: Apply silicone border over primary dressing as directed. Secured With Compression Wrap Compression Stockings Facilities manager) Signed: 11/25/2023 4:35:06 PM By: Zenaida Deed RN, BSN Entered By: Zenaida Deed on 11/25/2023 09:25:01 -------------------------------------------------------------------------------- Vitals Details Patient Name: Date of Service: [name], Andra 11/25/2023 9:00 A M Medical Record Number: [id number] Patient Account Number: [id number] Date of Birth/Sex: Treating RN: 1966/03/31 (58 y.o. [name] Primary Care Caelie [place]: Ladora Daniel Other Clinician: Referring Aemon Koeller: Treating Cadence Minton/Extender: Daniel Nones Weeks in Treatment: 0 Vital Signs Time Taken: 09:15 Temperature (F): 98.9 Pulse (bpm): 77 Respiratory Rate (breaths/min): 18 Blood Pressure (mmHg): 126/72 Reference Range: 80 - 120 mg / dl Electronic Signature(s) Signed: 11/25/2023 4:35:06 PM By: Zenaida Deed RN, BSN Entered By: Zenaida Deed on 11/25/2023 09:27:12

## 2023-11-25 NOTE — Progress Notes (Signed)
Carlos, [name] ([id number]) [phone].pdf Page 1 of 4 Visit Report for 11/25/2023 Abuse Risk Screen Details Patient Name: Date of Service: Carlos [name], Carlos [name] 11/25/2023 9:00 A M Medical Record Number: [id number] Patient Account Number: [id number] Date of Birth/Sex: Treating RN: [birth year] (58 y.o. [name] Primary Care Rainier Feuerborn: Ladora Daniel Other Clinician: Referring Reeves Musick: Treating Jye Fariss/Extender: Daniel Nones Weeks in Treatment: 0 Abuse Risk Screen Items Answer ABUSE RISK SCREEN: Has anyone close to you tried to hurt or harm you recentlyo No Do you feel uncomfortable with anyone in your familyo No Has anyone forced you do things that you didnt want to doo No Electronic Signature(s) Signed: 11/25/2023 4:35:06 PM By: Zenaida Deed RN, BSN Entered By: Zenaida Deed on 11/25/2023 09:12:22 -------------------------------------------------------------------------------- Activities of Daily Living Details Patient Name: Date of Service: Carlos, [name] 11/25/2023 9:00 A M Medical Record Number: [id number] Patient Account Number: [id number] Date of Birth/Sex: Treating RN: [birth year] (58 y.o. [name] Primary Care Lavaun Greenfield: Ladora Daniel Other Clinician: Referring Deja Pisarski: Treating Shunna Mikaelian/Extender: Daniel Nones Weeks in Treatment: 0 Activities of Daily Living Items Answer Activities of Daily Living (Please select one for each item) Drive Automobile Not Able T Medications ake Completely Able Use T elephone Completely Able Care for Appearance Need Assistance Use T oilet Need Assistance Bath / Shower Need Assistance Dress Self Need Assistance Feed Self Completely Able Walk Not Able Get In / Out Bed Need Assistance Housework Need Assistance Prepare Meals Need Assistance Handle Money Completely Able Shop for Self Need Assistance Electronic Signature(s) Signed: 11/25/2023 4:35:06 PM By: Zenaida Deed  RN, BSN Entered By: Zenaida Deed on 11/25/2023 09:13:47 Cresenciano Lick ([id number]) [id number] Page 2 of 4 -------------------------------------------------------------------------------- Education Screening Details Patient Name: Date of Service: Carlos, [name] 11/25/2023 9:00 A M Medical Record Number: [id number] Patient Account Number: [id number] Date of Birth/Sex: Treating RN: 03-08-1966 (58 y.o. [name] Primary Care Simran Bomkamp: Ladora Daniel Other Clinician: Referring Fiza Nation: Treating Mischa Pollard/Extender: Dorthula Nettles in Treatment: 0 Primary Learner Assessed: Patient Learning Preferences/Education Level/Primary Language Learning Preference: Explanation, Demonstration, Video, Printed Material Highest Education Level: College or Above Preferred Language: English Cognitive Barrier Language Barrier: No Translator Needed: No Memory Deficit: No Emotional Barrier: No Cultural/Religious Beliefs Affecting Medical Care: No Physical Barrier Impaired Vision: No Impaired Hearing: No Decreased Hand dexterity: Yes Limitations: quadraplegic, left hand contracture Knowledge/Comprehension Knowledge Level: High Comprehension Level: High Ability to understand written instructions: High Ability to understand verbal instructions: High Motivation Anxiety Level: Calm Cooperation: Cooperative Education Importance: Acknowledges Need Interest in Health Problems: Asks Questions Perception: Coherent Willingness to Engage in Self-Management High Activities: Readiness to Engage in Self-Management High Activities: Electronic Signature(s) Signed: 11/25/2023 4:35:06 PM By: Zenaida Deed RN, BSN Entered By: Zenaida Deed on 11/25/2023 09:14:37 -------------------------------------------------------------------------------- Fall Risk Assessment Details Patient Name: Date of Service: Carlos DY, [name] 11/25/2023 9:00 A M Medical Record  Number: [id number] Patient Account Number: [id number] Date of Birth/Sex: Treating RN: September 06, 1966 (58 y.o. [name] Primary Care Velmer Broadfoot: Ladora Daniel Other Clinician: Referring Jakarius Flamenco: Treating Caryl Manas/Extender: Daniel Nones Weeks in Treatment: 0 Fall Risk Assessment Items Have you had 2 or more falls in the last 12 monthso 0 No Clonch, Nora ([id number]) [id number] Page 3 of 4 Have you had any fall that resulted in injury in the last 12 monthso 0 No FALLS RISK SCREEN History of falling - immediate or within 3 months 0 No Secondary diagnosis (Do you have 2 or more medical diagnoseso) 0  No Ambulatory aid None/bed rest/wheelchair/nurse 0 Yes Crutches/cane/walker 0 No Furniture 0 No Intravenous therapy Access/Saline/Heparin Lock 0 No Gait/Transferring Normal/ bed rest/ wheelchair 0 Yes Weak (short steps with or without shuffle, stooped but able to lift head while walking, may seek 0 No support from furniture) Impaired (short steps with shuffle, may have difficulty arising from chair, head down, impaired 0 No balance) Mental Status Oriented to own ability 0 Yes Electronic Signature(s) Signed: 11/25/2023 4:35:06 PM By: Zenaida Deed RN, BSN Entered By: Zenaida Deed on 11/25/2023 09:14:54 -------------------------------------------------------------------------------- Foot Assessment Details Patient Name: Date of Service: Carlos [name], [name] 11/25/2023 9:00 A M Medical Record Number: [id number] Patient Account Number: [id number] Date of Birth/Sex: Treating RN: 04/04/1966 (58 y.o. [name] Primary Care Selita Staiger: Ladora Daniel Other Clinician: Referring [place] Duerst: Treating Akeela Busk/Extender: Daniel Nones Weeks in Treatment: 0 Foot Assessment Items Site Locations + = Sensation present, - = Sensation absent, C = Callus, U = Ulcer R = Redness, W = Warmth, M = Maceration, PU = Pre-ulcerative lesion F =  Fissure, S = Swelling, D = Dryness Assessment Right: Left: Other Deformity: No No Prior Foot Ulcer: No No Prior Amputation: No No Charcot Joint: No No Ambulatory Status: Non-ambulatory Assistance Device: Wheelchair Bromide, Dorene Sorrow ([id number]) [phone].pdf Page 4 of 4 Gait: Electronic Signature(s) Signed: 11/25/2023 4:35:06 PM By: Zenaida Deed RN, BSN Entered By: Zenaida Deed on 11/25/2023 09:15:31 -------------------------------------------------------------------------------- Nutrition Risk Screening Details Patient Name: Date of Service: Normand Sloop, Marshal 11/25/2023 9:00 A M Medical Record Number: [id number] Patient Account Number: [id number] Date of Birth/Sex: Treating RN: 20-Jan-1966 (58 y.o. [name] Primary Care Berlene Dixson: Ladora Daniel Other Clinician: Referring [place]: Treating Lavance Beazer/Extender: Daniel Nones Weeks in Treatment: 0 Height (in): Weight (lbs): Body Mass Index (BMI): Nutrition Risk Screening Items Score Screening NUTRITION RISK SCREEN: I have an illness or condition that made me change the kind and/or amount of food I eat 0 No I eat fewer than two meals per day 0 No I eat few fruits and vegetables, or milk products 0 No I have three or more drinks of beer, liquor or wine almost every day 0 No I have tooth or mouth problems that make it hard for me to eat 0 No I don't always have enough money to buy the food I need 0 No I eat alone most of the time 0 No I take three or more different prescribed or over-the-counter drugs a day 0 No Without wanting to, I have lost or gained 10 pounds in the last six months 0 No I am not always physically able to shop, cook and/or feed myself 0 No Nutrition Protocols Good Risk Protocol 0 No interventions needed Moderate Risk Protocol High Risk Proctocol Risk Level: Good Risk Score: 0 Electronic Signature(s) Signed: 11/25/2023 4:35:06 PM By: Zenaida Deed RN,  BSN Entered By: Zenaida Deed on 11/25/2023 09:15:12

## 2023-11-25 NOTE — Progress Notes (Signed)
Carlos, [name] ([id number]) [id number] Page 1 of 9 Visit Report for 11/25/2023 Chief Complaint Document Details Patient Name: Date of Service: Carlos [name], Carlos [name] 11/25/2023 9:00 A M Medical Record Number: [id number] Patient Account Number: [id number] Date of Birth/Sex: Treating RN: September 20, 1966 (58 y.o. M) Primary Care Provider: Ladora [name] Other Clinician: Referring Provider: Treating Provider/Extender: Carlos [name] in Treatment: 0 Information Obtained from: Patient Chief Complaint Patient seen for complaints of Non-Healing Wound. Electronic Signature(s) Signed: 11/25/2023 9:43:46 AM By: Duanne Guess MD FACS Previous Signature: 11/25/2023 9:43:31 AM Version By: Duanne Guess MD FACS Previous Signature: 11/25/2023 9:21:37 AM Version By: Duanne Guess MD FACS Entered By: Duanne [name] on 11/25/2023 09:43:46 -------------------------------------------------------------------------------- Debridement Details Patient Name: Date of Service: Carlos [name], Carlos [name] 11/25/2023 9:00 A M Medical Record Number: [id number] Patient Account Number: [id number] Date of Birth/Sex: Treating RN: Mar 30, 1966 (58 y.o. Carlos [name] Primary Care Provider: Ladora [name] Other Clinician: Referring Provider: Treating Provider/Extender: Carlos [name] Weeks in Treatment: 0 Debridement Performed for Assessment: Wound #1 Left Knee Performed By: Physician Duanne Guess, MD The following information was scribed by: Carlos [name] The information was scribed for: Duanne [name] Debridement Type: Debridement Level of Consciousness (Pre-procedure): Awake and Alert Pre-procedure Verification/Time Out Yes - 09:30 Taken: Start Time: 09:33 Pain Control: Lidocaine 4% T opical Solution Percent of Wound Bed Debrided: 100% T Area Debrided (cm): otal 4.49 Tissue and other material debrided: Viable, Non-Viable, Slough, Subcutaneous, Slough Level:  Skin/Subcutaneous Tissue Debridement Description: Excisional Instrument: Curette Bleeding: Minimum Hemostasis Achieved: Silver Nitrate Procedural Pain: 4 Post Procedural Pain: 2 Response to Treatment: Procedure was tolerated well Level of Consciousness (Post- Awake and Alert procedure): Post Debridement Measurements of Total Wound Length: (cm) 4.4 Width: (cm) 1.3 Depth: (cm) 0.1 Volume: (cm) 0.449 Espejo, Carlos [name] ([id number]) [id number] Page 2 of 9 Character of Wound/Ulcer Post Debridement: Improved Post Procedure Diagnosis Same as Pre-procedure Electronic Signature(s) Signed: 11/25/2023 10:21:15 AM By: Duanne Guess MD FACS Signed: 11/25/2023 4:35:06 PM By: Carlos Deed RN, BSN Entered By: Carlos [name] on 11/25/2023 09:37:42 -------------------------------------------------------------------------------- HPI Details Patient Name: Date of Service: Carlos [name], Carlos [name] 11/25/2023 9:00 A M Medical Record Number: [id number] Patient Account Number: [id number] Date of Birth/Sex: Treating RN: 1966-06-20 (58 y.o. 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 He has a wound on his left knee that has been present for over a year. Reviewing the provided outside notes, it appears that this has been managed with silver alginate and hydrocolloid dressing changes. The wound has nearly healed on multiple occasions, only to reopen with minimal trauma. He does have a history of prior surgery on that knee and the wound is in the scar line. 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 He is not diabetic and does  not smoke. Electronic Signature(s) Signed: 11/25/2023 9:51:40 AM By: Duanne Guess MD FACS Previous Signature: 11/25/2023 9:27:56 AM Version By: Duanne Guess MD FACS Entered By: Duanne [name] on 11/25/2023 09:51:40 -------------------------------------------------------------------------------- Physical Exam Details Patient Name: Date of Service: Carlos [name], Carlos [name] 11/25/2023 9:00 A M Medical Record Number: [id number] Patient Account Number: [id number] Date of Birth/Sex: Treating RN: [birth year] (58 y.o. M) Primary Care Provider: Ladora [name] Other Clinician: Referring Provider: Treating Provider/Extender: Carlos [name] Weeks in Treatment:  0 Constitutional . . . . No acute distress. Respiratory Normal work of breathing on room air. Notes On his anterior left knee, just above the tibial tuberosity, there are 2 open areas with hypertrophic granulation tissue. There is a visible surgical scar running between the 2 sites. There is no malodor, purulent drainage, or other indication of infection. Electronic Signature(s) Signed: 11/25/2023 9:52:47 AM By: Duanne Guess MD FACS Entered By: Duanne [name] on 11/25/2023 09:52:47 Carlos [name] ([id number]) [id number] Page 3 of 9 -------------------------------------------------------------------------------- Physician Orders Details Patient Name: Date of Service: Carlos [name], Carlos [name] 11/25/2023 9:00 A M Medical Record Number: [id number] Patient Account Number: [id number] Date of Birth/Sex: Treating RN: 02/25/1966 (58 y.o. Carlos [name] Primary Care Provider: Ladora [name] Other Clinician: Referring Provider: Treating Provider/Extender: Carlos [name] Weeks in Treatment: 0 The following information was scribed by: Carlos [name] The information was scribed for: Duanne [name] Verbal / Phone Orders: No Diagnosis Coding ICD-10 Coding Code Description [id number] Non-pressure chronic  ulcer of skin of other sites with fat layer exposed G82.50 Quadriplegia, unspecified E43 Unspecified severe protein-calorie malnutrition Follow-up Appointments ppointment in 1 week. - Dr. Lady [name] Return A Anesthetic Wound #1 Left Knee (In clinic) Topical Lidocaine 4% applied to wound bed Bathing/ Shower/ Hygiene May shower and wash wound with soap and water. Wound Treatment Wound #1 - Knee Wound Laterality: Left Cleanser: [employer] Ancillary Kit - 15 Day Supply (DME) (Generic) Every Other Day/30 Days Discharge Instructions: Use supplies as instructed; Kit contains: (15) Saline Bullets; (15) 3x3 Gauze; 15 pr Gloves Prim Dressing: Hydrofera Blue Ready Transfer Foam, 2.5x2.5 (in/in) (DME) (Generic) Every Other Day/30 Days ary Discharge Instructions: Apply directly to wound bed as directed Secondary Dressing: Zetuvit Plus Silicone Border Dressing 5x5 (in/in) (DME) (Generic) Every Other Day/30 Days Discharge Instructions: Apply silicone border over primary dressing as directed. Patient Medications llergies: Sulfa (Sulfonamide Antibiotics), baclofen A Notifications Medication Indication Start End prior to debridement 11/25/2023 lidocaine DOSE topical 4 % cream - cream topical Electronic Signature(s) Signed: 11/25/2023 9:53:15 AM By: Duanne Guess MD FACS Entered By: Duanne [name] on 11/25/2023 09:53:15 Problem List Details -------------------------------------------------------------------------------- Carlos [name] ([id number]) [id number] Page 4 of 9 Patient Name: Date of Service: Carlos [name], Carlos [name] 11/25/2023 9:00 A M Medical Record Number: [id number] Patient Account Number: [id number] Date of Birth/Sex: Treating RN: 03-11-1966 (58 y.o. M) Primary Care Provider: Ladora [name] Other Clinician: Referring Provider: Treating Provider/Extender: Carlos [name] Weeks in Treatment: 0 Active Problems ICD-10 Encounter Code Description Active Date  MDM Diagnosis [id number] Non-pressure chronic ulcer of skin of other sites with fat layer exposed 11/25/2023 No Yes G82.50 Quadriplegia, unspecified 11/25/2023 No Yes E43 Unspecified severe protein-calorie malnutrition 11/25/2023 No Yes Inactive Problems Resolved Problems Electronic Signature(s) Signed: 11/25/2023 9:43:18 AM By: Duanne Guess MD FACS Previous Signature: 11/25/2023 9:21:25 AM Version By: Duanne Guess MD FACS Entered By: Duanne [name] on 11/25/2023 09:43:18 -------------------------------------------------------------------------------- Progress Note Details Patient Name: Date of Service: Carlos [name], Carlos [name] 11/25/2023 9:00 A M Medical Record Number: [id number] Patient Account Number: [id number] Date of Birth/Sex: Treating RN: [birth year] (58 y.o. M) Primary Care Provider: Ladora [name] Other Clinician: Referring Provider: Treating Provider/Extender: Carlos [name] in Treatment: 0 Subjective Chief Complaint Information obtained from Patient Patient seen for complaints of Non-Healing Wound. 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 He has a wound on his left knee that has been present for over a year.  Reviewing the provided outside notes, it appears that this has been managed with silver alginate and hydrocolloid dressing changes. The wound has nearly healed on multiple occasions, only to reopen with minimal trauma. He does have a history of prior surgery on that knee and the wound is in the scar line. 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 He is not diabetic and does not smoke. Patient History Information obtained from Patient, Caregiver, Chart. Allergies Sulfa (Sulfonamide Antibiotics) (Reaction: hives), baclofen (Reaction: hives) Family History Cancer - Maternal  Grandparents,Mother, Heart Disease - Maternal Grandparents, Hypertension - Maternal Grandparents, No family history of Diabetes, Hereditary Spherocytosis, Kidney Disease, Lung Disease, Seizures, Stroke, Thyroid Problems, Tuberculosis. KADARIOUS, RISPER ([id number]) [id number] Page 5 of 9 Social History Never smoker, Marital Status - Divorced, Alcohol Use - Moderate - socially, Drug Use - No History, Caffeine Use - Daily - coffee. Medical History Eyes Denies history of Cataracts, Glaucoma, Optic Neuritis Ear/Nose/Mouth/Throat Denies history of Chronic sinus problems/congestion, Middle ear problems Endocrine Denies history of Type I Diabetes, Type II Diabetes Genitourinary Denies history of End Stage Renal Disease Integumentary (Skin) Denies history of History of Burn Neurologic Patient has history of Quadriplegia Oncologic Denies history of Received Chemotherapy, Received Radiation Psychiatric Denies history of Anorexia/bulimia, Confinement Anxiety Hospitalization/Surgery History - cervical laminectomy C3-C6 with intradural mass removal. Medical A Surgical History Notes nd Neurologic spinal cord ependymoma Review of Systems (ROS) Constitutional Symptoms (General Health) Denies complaints or symptoms of Fatigue, Fever, Chills, Marked Weight Change. Eyes Complains or has symptoms of Glasses / Contacts - reading. Denies complaints or symptoms of Dry Eyes, Vision Changes. Ear/Nose/Mouth/Throat Denies complaints or symptoms of Chronic sinus problems or rhinitis. Respiratory Denies complaints or symptoms of Chronic or frequent coughs, Shortness of Breath. Cardiovascular Denies complaints or symptoms of Chest pain. Gastrointestinal Denies complaints or symptoms of Frequent diarrhea, Nausea, Vomiting. Endocrine Denies complaints or symptoms of Heat/cold intolerance. Genitourinary Denies complaints or symptoms of Frequent urination. Integumentary  (Skin) Complains or has symptoms of Wounds - left knee. Musculoskeletal Complains or has symptoms of Muscle Weakness. Denies complaints or symptoms of Muscle Pain. Neurologic Complains or has symptoms of Numbness/parasthesias - both hands. Psychiatric Denies complaints or symptoms of Claustrophobia. Objective Constitutional No acute distress. Vitals Time Taken: 9:15 AM, Temperature: 98.9 F, Pulse: 77 bpm, Respiratory Rate: 18 breaths/min, Blood Pressure: 126/72 mmHg. Respiratory Normal work of breathing on room air. General Notes: On his anterior left knee, just above the tibial tuberosity, there are 2 open areas with hypertrophic granulation tissue. There is a visible surgical scar running between the 2 sites. There is no malodor, purulent drainage, or other indication of infection. Integumentary (Hair, Skin) Wound #1 status is Open. Original cause of wound was Gradually Appeared. The date acquired was: 04/05/2022. The wound is located on the Left Knee. The wound measures 4.4cm length x 1.3cm width x 0.1cm depth; 4.492cm^2 area and 0.449cm^3 volume. There is Fat Layer (Subcutaneous Tissue) exposed. There is no tunneling or undermining noted. There is a medium amount of serosanguineous drainage noted. The wound margin is flat and intact. There is large (67-100%) red, friable, hyper - granulation within the wound bed. There is a small (1-33%) amount of necrotic tissue within the wound bed including Eschar. The periwound skin appearance had no abnormalities noted for moisture. The periwound skin appearance had no abnormalities noted for color. The periwound skin appearance exhibited: Scarring. Periwound temperature was noted as No Abnormality. The periwound has tenderness on  palpation. GIANNO, RONDAN ([id number]) [id number] Page 6 of 9 Assessment Active Problems ICD-10 Non-pressure chronic ulcer of skin of other sites with fat layer exposed Quadriplegia,  unspecified Unspecified severe protein-calorie malnutrition Procedures Wound #1 Pre-procedure diagnosis of Wound #1 is an Abrasion located on the Left Knee . There was a Excisional Skin/Subcutaneous Tissue Debridement with a total area of 4.49 sq cm performed by Duanne Guess, MD. With the following instrument(s): Curette to remove Viable and Non-Viable tissue/material. Material removed includes Subcutaneous Tissue and Slough and after achieving pain control using Lidocaine 4% T opical Solution. No specimens were taken. A time out was conducted at 09:30, prior to the start of the procedure. A Minimum amount of bleeding was controlled with Silver Nitrate. The procedure was tolerated well with a pain level of 4 throughout and a pain level of 2 following the procedure. Post Debridement Measurements: 4.4cm length x 1.3cm width x 0.1cm depth; 0.449cm^3 volume. Character of Wound/Ulcer Post Debridement is improved. Post procedure Diagnosis Wound #1: Same as Pre-Procedure Plan Follow-up Appointments: Return Appointment in 1 week. - Dr. Lady [name] Anesthetic: Wound #1 Left Knee: (In clinic) Topical Lidocaine 4% applied to wound bed Bathing/ Shower/ Hygiene: May shower and wash wound with soap and water. The following medication(s) was prescribed: lidocaine topical 4 % cream cream topical for prior to debridement was prescribed at facility WOUND #1: - Knee Wound Laterality: Left Cleanser: [employer] Ancillary Kit - 15 Day Supply (DME) (Generic) Every Other Day/30 Days Discharge Instructions: Use supplies as instructed; Kit contains: (15) Saline Bullets; (15) 3x3 Gauze; 15 pr Gloves Prim Dressing: Hydrofera Blue Ready Transfer Foam, 2.5x2.5 (in/in) (DME) (Generic) Every Other Day/30 Days ary Discharge Instructions: Apply directly to wound bed as directed Secondary Dressing: Zetuvit Plus Silicone Border Dressing 5x5 (in/in) (DME) (Generic) Every Other Day/30 Days Discharge Instructions: Apply silicone  border over primary dressing as directed. 11/25/2023: This is a 58 year old quadriplegic with a nonhealing wound to the left knee. On his anterior left knee, just above the tibial tuberosity, there are 2 open areas with hypertrophic granulation tissue. There is a visible surgical scar running between the 2 sites. There is no malodor, purulent drainage, or other indication of infection. I used a curette to debride slough and subcutaneous tissue from the wound. I then chemically cauterized the hypertrophic granulation tissue using silver nitrate. We will apply Hydrofera Blue with a foam border dressing. Follow-up in 1 week. Electronic Signature(s) Signed: 11/25/2023 10:43:22 AM By: Duanne Guess MD FACS Signed: 11/25/2023 4:35:06 PM By: Carlos Deed RN, BSN Previous Signature: 11/25/2023 9:56:46 AM Version By: Duanne Guess MD FACS Entered By: Carlos [name] on 11/25/2023 10:22:45 -------------------------------------------------------------------------------- HxROS Details Patient Name: Date of Service: Carlos [name], Carlos [name] 11/25/2023 9:00 A M Medical Record Number: [id number] Patient Account Number: [id number] Date of Birth/Sex: Treating RN: 11/12/1965 (58 y.o. Carlos [name] Primary Care Provider: Ladora [name] Other Clinician: Referring Provider: Treating Provider/Extender: Carlos [name] Weeks in Treatment: 0 Carlos [name], Carlos [name] ([id number]) [id number] Page 7 of 9 Information Obtained From Patient Caregiver Chart Constitutional Symptoms (General Health) Complaints and Symptoms: Negative for: Fatigue; Fever; Chills; Marked Weight Change Eyes Complaints and Symptoms: Positive for: Glasses / Contacts - reading Negative for: Dry Eyes; Vision Changes Medical History: Negative for: Cataracts; Glaucoma; Optic Neuritis Ear/Nose/Mouth/Throat Complaints and Symptoms: Negative for: Chronic sinus problems or rhinitis Medical History: Negative for:  Chronic sinus problems/congestion; Middle ear problems Respiratory Complaints and Symptoms: Negative for: Chronic or frequent coughs; Shortness of Breath Cardiovascular Complaints and Symptoms: Negative for:  Chest pain Gastrointestinal Complaints and Symptoms: Negative for: Frequent diarrhea; Nausea; Vomiting Endocrine Complaints and Symptoms: Negative for: Heat/cold intolerance Medical History: Negative for: Type I Diabetes; Type II Diabetes Genitourinary Complaints and Symptoms: Negative for: Frequent urination Medical History: Negative for: End Stage Renal Disease Integumentary (Skin) Complaints and Symptoms: Positive for: Wounds - left knee Medical History: Negative for: History of Burn Musculoskeletal Complaints and Symptoms: Positive for: Muscle Weakness Negative for: Muscle Pain Neurologic Complaints and Symptoms: Positive for: Numbness/parasthesias - both hands Medical History: Positive for: Quadriplegia Past Medical History Notes: spinal cord ependymoma Carlos [name], Carlos [name] ([id number]) [id number] Page 8 of 9 Psychiatric Complaints and Symptoms: Negative for: Claustrophobia Medical History: Negative for: Anorexia/bulimia; Confinement Anxiety Hematologic/Lymphatic Immunological Oncologic Medical History: Negative for: Received Chemotherapy; Received Radiation Immunizations Pneumococcal Vaccine: Received Pneumococcal Vaccination: No Implantable Devices None Hospitalization / Surgery History Type of Hospitalization/Surgery cervical laminectomy C3-C6 with intradural mass removal Family and Social History Cancer: Yes - Maternal Grandparents,Mother; Diabetes: No; Heart Disease: Yes - Maternal Grandparents; Hereditary Spherocytosis: No; Hypertension: Yes - Maternal Grandparents; Kidney Disease: No; Lung Disease: No; Seizures: No; Stroke: No; Thyroid Problems: No; Tuberculosis: No; Never smoker; Marital Status - Divorced; Alcohol Use:  Moderate - socially; Drug Use: No History; Caffeine Use: Daily - coffee Social Determinants of Health (SDOH) 1. In the past 2 months, did you or others you live with eat smaller meals or skip meals because you didn't have money for foodo : No 2. Are you homeless or worried that you might be in the futureo : No 3. Do you have trouble paying for your utilities (gas, electricity, phone)o : No 4. Do you have trouble finding or paying for a rideo : No 5. Do you need daycare, or better daycare, for your kidso : No 6. Are you unemployed or without regular incomeo : No 7. Do you need help finding a better jobo : No 8. Do you need help getting more educationo : No 9. Are you concerned about someone in your home using drugs or alcoholo : No 10. Do you feel unsafe in your daily lifeo : No 11. Is anyone in your home threatening or abusing youo : No 12. Do you lack quality relationships that make you feel valued and supportedo : No 13. Do you need help getting cultural information in a language you understando : No 14. Do you need help getting internet accesso : No Advanced Directives and Instructions Spiritual or Cultural beliefs preclude asking about Advance Care Planning: No Advanced Directives: No Patient wants information on Advanced Directives: No Do not resuscitate: No Living Will: No Medical Power of Attorney: No Surrogate Decision Maker: No Electronic Signature(s) Signed: 11/25/2023 10:43:22 AM By: Duanne Guess MD FACS Signed: 11/25/2023 4:35:06 PM By: Carlos Deed RN, BSN Previous Signature: 11/25/2023 10:21:15 AM Version By: Duanne Guess MD FACS Entered By: Carlos [name] on 11/25/2023 10:22:31 -------------------------------------------------------------------------------- SuperBill Details Patient Name: Date of Service: Carlos [name], Carlos [name] 11/25/2023 Medical Record Number: [id number] Patient Account Number: [id number] Date of Birth/Sex: Treating RN: [birth year] (58 y.o. Elton Sin,  Carlos [name] ([id number]) [id number] Page 9 of 9 Primary Care Provider: Ladora [name] Other Clinician: Referring Provider: Treating Provider/Extender: Carlos [name] Weeks in Treatment: 0 Diagnosis Coding ICD-10 Codes Code Description [phone] Non-pressure chronic ulcer of skin of other sites with fat layer exposed G82.50 Quadriplegia, unspecified E43 Unspecified severe protein-calorie malnutrition Facility Procedures : CPT4 Code: [id number] Description: 11042 - DEB SUBQ TISSUE 20 SQ CM/< ICD-10 Diagnosis Description L98.492 Non-pressure chronic ulcer of skin  of other sites with fat layer exposed Modifier: Quantity: 1 Physician Procedures : CPT4 Code Description Modifier [id number] 99204 - WC PHYS LEVEL 4 - NEW PT 25 ICD-10 Diagnosis Description L98.492 Non-pressure chronic ulcer of skin of other sites with fat layer exposed G82.50 Quadriplegia, unspecified E43 Unspecified severe  protein-calorie malnutrition Quantity: 1 : [id number] 11042 - WC PHYS SUBQ TISS 20 SQ CM ICD-10 Diagnosis Description L98.492 Non-pressure chronic ulcer of skin of other sites with fat layer exposed Quantity: 1 Electronic Signature(s) Signed: 11/25/2023 9:59:00 AM By: Duanne Guess MD FACS Entered By: Duanne [name] on 11/25/2023 09:59:00

## 2023-12-03 ENCOUNTER — Ambulatory Visit (HOSPITAL_BASED_OUTPATIENT_CLINIC_OR_DEPARTMENT_OTHER): Payer: Medicare Other | Admitting: General Surgery

## 2023-12-09 ENCOUNTER — Encounter (HOSPITAL_BASED_OUTPATIENT_CLINIC_OR_DEPARTMENT_OTHER): Payer: Medicare Other | Attending: General Surgery | Admitting: General Surgery

## 2023-12-09 DIAGNOSIS — L98492 Non-pressure chronic ulcer of skin of other sites with fat layer exposed: Secondary | ICD-10-CM | POA: Insufficient documentation

## 2023-12-09 DIAGNOSIS — G8250 Quadriplegia, unspecified: Secondary | ICD-10-CM | POA: Insufficient documentation

## 2023-12-16 ENCOUNTER — Ambulatory Visit (HOSPITAL_BASED_OUTPATIENT_CLINIC_OR_DEPARTMENT_OTHER): Payer: Medicare Other | Admitting: General Surgery

## 2023-12-23 ENCOUNTER — Encounter (HOSPITAL_BASED_OUTPATIENT_CLINIC_OR_DEPARTMENT_OTHER): Payer: Medicare Other | Admitting: General Surgery

## 2023-12-23 DIAGNOSIS — L98492 Non-pressure chronic ulcer of skin of other sites with fat layer exposed: Secondary | ICD-10-CM | POA: Diagnosis not present

## 2023-12-30 ENCOUNTER — Ambulatory Visit (HOSPITAL_BASED_OUTPATIENT_CLINIC_OR_DEPARTMENT_OTHER): Payer: Medicare Other | Admitting: General Surgery

## 2024-01-05 ENCOUNTER — Encounter (HOSPITAL_BASED_OUTPATIENT_CLINIC_OR_DEPARTMENT_OTHER): Payer: Medicare Other | Attending: General Surgery | Admitting: General Surgery

## 2024-01-05 DIAGNOSIS — G8250 Quadriplegia, unspecified: Secondary | ICD-10-CM | POA: Diagnosis not present

## 2024-01-05 DIAGNOSIS — E43 Unspecified severe protein-calorie malnutrition: Secondary | ICD-10-CM | POA: Insufficient documentation

## 2024-01-05 DIAGNOSIS — L98492 Non-pressure chronic ulcer of skin of other sites with fat layer exposed: Secondary | ICD-10-CM | POA: Insufficient documentation

## 2024-01-20 ENCOUNTER — Encounter (HOSPITAL_BASED_OUTPATIENT_CLINIC_OR_DEPARTMENT_OTHER): Admitting: General Surgery

## 2024-01-20 DIAGNOSIS — L98492 Non-pressure chronic ulcer of skin of other sites with fat layer exposed: Secondary | ICD-10-CM | POA: Diagnosis not present

## 2024-02-03 ENCOUNTER — Ambulatory Visit (HOSPITAL_BASED_OUTPATIENT_CLINIC_OR_DEPARTMENT_OTHER): Admitting: General Surgery

## 2024-02-10 ENCOUNTER — Encounter (HOSPITAL_BASED_OUTPATIENT_CLINIC_OR_DEPARTMENT_OTHER): Admitting: General Surgery

## 2024-02-17 ENCOUNTER — Encounter (HOSPITAL_BASED_OUTPATIENT_CLINIC_OR_DEPARTMENT_OTHER): Attending: General Surgery | Admitting: General Surgery

## 2024-02-17 DIAGNOSIS — G8250 Quadriplegia, unspecified: Secondary | ICD-10-CM | POA: Insufficient documentation

## 2024-02-17 DIAGNOSIS — E43 Unspecified severe protein-calorie malnutrition: Secondary | ICD-10-CM | POA: Insufficient documentation

## 2024-02-17 DIAGNOSIS — L97821 Non-pressure chronic ulcer of other part of left lower leg limited to breakdown of skin: Secondary | ICD-10-CM | POA: Insufficient documentation

## 2024-03-16 ENCOUNTER — Encounter (HOSPITAL_BASED_OUTPATIENT_CLINIC_OR_DEPARTMENT_OTHER): Attending: Internal Medicine | Admitting: Internal Medicine

## 2024-04-13 ENCOUNTER — Encounter (HOSPITAL_BASED_OUTPATIENT_CLINIC_OR_DEPARTMENT_OTHER): Attending: General Surgery | Admitting: General Surgery

## 2024-05-11 ENCOUNTER — Encounter (HOSPITAL_BASED_OUTPATIENT_CLINIC_OR_DEPARTMENT_OTHER): Attending: General Surgery | Admitting: General Surgery

## 2024-05-11 DIAGNOSIS — L97821 Non-pressure chronic ulcer of other part of left lower leg limited to breakdown of skin: Secondary | ICD-10-CM | POA: Diagnosis present

## 2024-05-11 DIAGNOSIS — Z6824 Body mass index (BMI) 24.0-24.9, adult: Secondary | ICD-10-CM | POA: Diagnosis not present

## 2024-05-11 DIAGNOSIS — G8250 Quadriplegia, unspecified: Secondary | ICD-10-CM | POA: Insufficient documentation

## 2024-05-11 DIAGNOSIS — E43 Unspecified severe protein-calorie malnutrition: Secondary | ICD-10-CM | POA: Insufficient documentation

## 2024-06-08 ENCOUNTER — Encounter (HOSPITAL_BASED_OUTPATIENT_CLINIC_OR_DEPARTMENT_OTHER): Admitting: General Surgery
